# Patient Record
Sex: Female | Born: 1945 | Race: Black or African American | Hispanic: No | Smoking: Former smoker
Health system: Southern US, Community
[De-identification: ages and names within clinical notes are randomized; demographics above are authoritative.]

## PROBLEM LIST (undated history)

## (undated) DIAGNOSIS — E119 Type 2 diabetes mellitus without complications: Secondary | ICD-10-CM

## (undated) DIAGNOSIS — I639 Cerebral infarction, unspecified: Secondary | ICD-10-CM

## (undated) DIAGNOSIS — I10 Essential (primary) hypertension: Secondary | ICD-10-CM

## (undated) DIAGNOSIS — M1990 Unspecified osteoarthritis, unspecified site: Secondary | ICD-10-CM

## (undated) HISTORY — PX: HIP SURGERY: SHX245

## (undated) HISTORY — PX: CHOLECYSTECTOMY: SHX55

## (undated) HISTORY — PX: JOINT REPLACEMENT: SHX530

---

## 2004-07-04 ENCOUNTER — Other Ambulatory Visit: Payer: Self-pay

## 2004-07-05 ENCOUNTER — Ambulatory Visit: Payer: Self-pay

## 2005-02-07 ENCOUNTER — Ambulatory Visit: Payer: Self-pay

## 2006-11-13 ENCOUNTER — Ambulatory Visit: Payer: Self-pay | Admitting: Internal Medicine

## 2011-07-03 ENCOUNTER — Ambulatory Visit: Payer: Self-pay

## 2011-07-09 ENCOUNTER — Ambulatory Visit: Payer: Self-pay

## 2012-07-21 ENCOUNTER — Ambulatory Visit: Payer: Self-pay | Admitting: Internal Medicine

## 2012-11-02 ENCOUNTER — Ambulatory Visit: Payer: Self-pay | Admitting: Internal Medicine

## 2012-11-21 ENCOUNTER — Ambulatory Visit: Payer: Self-pay | Admitting: Internal Medicine

## 2013-03-01 ENCOUNTER — Ambulatory Visit: Payer: Self-pay | Admitting: Specialist

## 2013-03-01 LAB — BASIC METABOLIC PANEL
Anion Gap: 3 — ABNORMAL LOW (ref 7–16)
Calcium, Total: 9.3 mg/dL (ref 8.5–10.1)
Co2: 31 mmol/L (ref 21–32)
EGFR (African American): >60
Osmolality: 285 (ref 275–301)
Potassium: 3.9 mmol/L (ref 3.5–5.1)
Sodium: 141 mmol/L (ref 136–145)

## 2013-03-01 LAB — URINALYSIS, COMPLETE
Blood: NEGATIVE
Leukocyte Esterase: NEGATIVE
Protein: NEGATIVE
Squamous Epithelial: 6

## 2013-03-01 LAB — MRSA PCR SCREENING

## 2013-03-01 LAB — PROTIME-INR: Prothrombin Time: 13.5 secs (ref 11.5–14.7)

## 2013-03-01 LAB — CBC
MCH: 30 pg (ref 26.0–34.0)
RDW: 14.2 % (ref 11.5–14.5)

## 2013-03-10 ENCOUNTER — Inpatient Hospital Stay: Payer: Self-pay | Admitting: Specialist

## 2013-03-11 LAB — BASIC METABOLIC PANEL
Anion Gap: 6 — ABNORMAL LOW (ref 7–16)
BUN: 21 mg/dL — ABNORMAL HIGH (ref 7–18)
Chloride: 109 mmol/L — ABNORMAL HIGH (ref 98–107)
Co2: 23 mmol/L (ref 21–32)
Creatinine: 1.06 mg/dL (ref 0.60–1.30)
EGFR (African American): >60
EGFR (Non-African Amer.): 55 — ABNORMAL LOW
Glucose: 171 mg/dL — ABNORMAL HIGH (ref 65–99)
Osmolality: 283 (ref 275–301)
Potassium: 4 mmol/L (ref 3.5–5.1)
Sodium: 138 mmol/L (ref 136–145)

## 2013-03-12 LAB — HEMOGLOBIN: HGB: 7.3 g/dL — ABNORMAL LOW

## 2013-03-13 LAB — HEMOGLOBIN: HGB: 7.1 g/dL — ABNORMAL LOW (ref 12.0–16.0)

## 2013-03-14 ENCOUNTER — Encounter: Payer: Self-pay | Admitting: Internal Medicine

## 2013-03-23 ENCOUNTER — Encounter: Payer: Self-pay | Admitting: Internal Medicine

## 2013-09-13 ENCOUNTER — Ambulatory Visit: Payer: Self-pay | Admitting: Internal Medicine

## 2013-09-29 ENCOUNTER — Ambulatory Visit: Payer: Self-pay | Admitting: Podiatry

## 2013-12-13 ENCOUNTER — Inpatient Hospital Stay: Payer: Self-pay | Admitting: Internal Medicine

## 2013-12-13 LAB — CBC WITH DIFFERENTIAL/PLATELET
Basophil #: 0 10*3/uL (ref 0.0–0.1)
Basophil %: 0.4 %
Eosinophil #: 0 10*3/uL (ref 0.0–0.7)
Eosinophil %: 0.6 %
HCT: 37.6 % (ref 35.0–47.0)
HGB: 12.1 g/dL (ref 12.0–16.0)
LYMPHS PCT: 20.9 %
Lymphocyte #: 1.6 10*3/uL (ref 1.0–3.6)
MCH: 28.6 pg (ref 26.0–34.0)
MCHC: 32.3 g/dL (ref 32.0–36.0)
MCV: 89 fL (ref 80–100)
MONO ABS: 0.7 x10 3/mm (ref 0.2–0.9)
Monocyte %: 9.5 %
NEUTROS PCT: 68.6 %
Neutrophil #: 5.3 10*3/uL (ref 1.4–6.5)
PLATELETS: 270 10*3/uL (ref 150–440)
RBC: 4.24 10*6/uL (ref 3.80–5.20)
RDW: 15 % — AB (ref 11.5–14.5)
WBC: 7.7 10*3/uL (ref 3.6–11.0)

## 2013-12-13 LAB — BASIC METABOLIC PANEL
Anion Gap: 4 — ABNORMAL LOW (ref 7–16)
BUN: 14 mg/dL (ref 7–18)
CHLORIDE: 107 mmol/L (ref 98–107)
CO2: 29 mmol/L (ref 21–32)
Calcium, Total: 9.4 mg/dL (ref 8.5–10.1)
Creatinine: 1.08 mg/dL (ref 0.60–1.30)
GFR CALC NON AF AMER: 53 — AB
Glucose: 123 mg/dL — ABNORMAL HIGH (ref 65–99)
Osmolality: 281 (ref 275–301)
POTASSIUM: 4.7 mmol/L (ref 3.5–5.1)
SODIUM: 140 mmol/L (ref 136–145)

## 2013-12-13 LAB — TROPONIN I: Troponin-I: <0.02 ng/mL

## 2013-12-14 ENCOUNTER — Ambulatory Visit: Payer: Self-pay | Admitting: Neurology

## 2013-12-14 DIAGNOSIS — I519 Heart disease, unspecified: Secondary | ICD-10-CM

## 2013-12-14 LAB — LIPID PANEL
Cholesterol: 132 mg/dL (ref 0–200)
HDL Cholesterol: 66 mg/dL — ABNORMAL HIGH (ref 40–60)
Ldl Cholesterol, Calc: 42 mg/dL (ref 0–100)
TRIGLYCERIDES: 120 mg/dL (ref 0–200)
VLDL CHOLESTEROL, CALC: 24 mg/dL (ref 5–40)

## 2013-12-23 ENCOUNTER — Inpatient Hospital Stay: Payer: Self-pay | Admitting: Internal Medicine

## 2013-12-23 LAB — CBC
HCT: 40 % (ref 35.0–47.0)
HGB: 12.9 g/dL (ref 12.0–16.0)
MCH: 28.8 pg (ref 26.0–34.0)
MCHC: 32.2 g/dL (ref 32.0–36.0)
MCV: 89 fL (ref 80–100)
PLATELETS: 226 10*3/uL (ref 150–440)
RBC: 4.48 10*6/uL (ref 3.80–5.20)
RDW: 15.2 % — AB (ref 11.5–14.5)
WBC: 14.1 10*3/uL — AB (ref 3.6–11.0)

## 2013-12-23 LAB — COMPREHENSIVE METABOLIC PANEL
ALK PHOS: 109 U/L
ANION GAP: 8 (ref 7–16)
Albumin: 3.9 g/dL (ref 3.4–5.0)
BUN: 19 mg/dL — ABNORMAL HIGH (ref 7–18)
Bilirubin,Total: 1.4 mg/dL — ABNORMAL HIGH (ref 0.2–1.0)
Calcium, Total: 9.6 mg/dL (ref 8.5–10.1)
Chloride: 102 mmol/L (ref 98–107)
Co2: 24 mmol/L (ref 21–32)
Creatinine: 0.97 mg/dL (ref 0.60–1.30)
EGFR (African American): >60
Glucose: 189 mg/dL — ABNORMAL HIGH (ref 65–99)
OSMOLALITY: 276 (ref 275–301)
POTASSIUM: 4 mmol/L (ref 3.5–5.1)
SGOT(AST): 211 U/L — ABNORMAL HIGH (ref 15–37)
SGPT (ALT): 121 U/L — ABNORMAL HIGH (ref 12–78)
SODIUM: 134 mmol/L — AB (ref 136–145)
Total Protein: 8.9 g/dL — ABNORMAL HIGH (ref 6.4–8.2)

## 2013-12-23 LAB — PROTIME-INR
INR: 1
PROTHROMBIN TIME: 12.9 s (ref 11.5–14.7)

## 2013-12-23 LAB — T4, FREE: Free Thyroxine: 1.16 ng/dL (ref 0.76–1.46)

## 2013-12-23 LAB — TROPONIN I

## 2013-12-23 LAB — CK TOTAL AND CKMB (NOT AT ARMC)
CK, Total: 98 U/L
CK-MB: 1 ng/mL (ref 0.5–3.6)

## 2013-12-23 LAB — APTT: ACTIVATED PTT: 37.6 s — AB (ref 23.6–35.9)

## 2013-12-23 LAB — LIPASE, BLOOD: Lipase: 241 U/L (ref 73–393)

## 2013-12-24 LAB — CBC WITH DIFFERENTIAL/PLATELET
Basophil #: 0 10*3/uL (ref 0.0–0.1)
Basophil %: 0.2 %
EOS ABS: 0 10*3/uL (ref 0.0–0.7)
EOS PCT: 0 %
HCT: 33.4 % — AB (ref 35.0–47.0)
HGB: 11 g/dL — AB (ref 12.0–16.0)
Lymphocyte #: 0.6 10*3/uL — ABNORMAL LOW (ref 1.0–3.6)
Lymphocyte %: 4.4 %
MCH: 29 pg (ref 26.0–34.0)
MCHC: 32.8 g/dL (ref 32.0–36.0)
MCV: 88 fL (ref 80–100)
MONO ABS: 0.8 x10 3/mm (ref 0.2–0.9)
MONOS PCT: 6.1 %
NEUTROS ABS: 11.8 10*3/uL — AB (ref 1.4–6.5)
NEUTROS PCT: 89.3 %
Platelet: 193 10*3/uL (ref 150–440)
RBC: 3.79 10*6/uL — ABNORMAL LOW (ref 3.80–5.20)
RDW: 15.2 % — AB (ref 11.5–14.5)
WBC: 13.2 10*3/uL — ABNORMAL HIGH (ref 3.6–11.0)

## 2013-12-24 LAB — COMPREHENSIVE METABOLIC PANEL
Albumin: 3 g/dL — ABNORMAL LOW (ref 3.4–5.0)
Alkaline Phosphatase: 126 U/L — ABNORMAL HIGH
Anion Gap: 6 — ABNORMAL LOW (ref 7–16)
BILIRUBIN TOTAL: 2.6 mg/dL — AB (ref 0.2–1.0)
BUN: 22 mg/dL — ABNORMAL HIGH (ref 7–18)
CHLORIDE: 104 mmol/L (ref 98–107)
Calcium, Total: 8.6 mg/dL (ref 8.5–10.1)
Co2: 26 mmol/L (ref 21–32)
Creatinine: 1.51 mg/dL — ABNORMAL HIGH (ref 0.60–1.30)
EGFR (Non-African Amer.): 36 — ABNORMAL LOW
GFR CALC AF AMER: 41 — AB
Glucose: 160 mg/dL — ABNORMAL HIGH (ref 65–99)
Osmolality: 279 (ref 275–301)
Potassium: 3.6 mmol/L (ref 3.5–5.1)
SGOT(AST): 1222 U/L — ABNORMAL HIGH (ref 15–37)
SGPT (ALT): 943 U/L — ABNORMAL HIGH (ref 12–78)
SODIUM: 136 mmol/L (ref 136–145)
TOTAL PROTEIN: 6.9 g/dL (ref 6.4–8.2)

## 2013-12-25 ENCOUNTER — Ambulatory Visit: Payer: Self-pay | Admitting: Neurology

## 2013-12-25 LAB — COMPREHENSIVE METABOLIC PANEL
ALBUMIN: 2.6 g/dL — AB (ref 3.4–5.0)
ALT: 608 U/L — AB (ref 12–78)
AST: 378 U/L — AB (ref 15–37)
Alkaline Phosphatase: 153 U/L — ABNORMAL HIGH
Anion Gap: 8 (ref 7–16)
BILIRUBIN TOTAL: 2.8 mg/dL — AB (ref 0.2–1.0)
BUN: 13 mg/dL (ref 7–18)
CALCIUM: 7.8 mg/dL — AB (ref 8.5–10.1)
Chloride: 109 mmol/L — ABNORMAL HIGH (ref 98–107)
Co2: 23 mmol/L (ref 21–32)
Creatinine: 1.12 mg/dL (ref 0.60–1.30)
EGFR (Non-African Amer.): 51 — ABNORMAL LOW
GFR CALC AF AMER: 59 — AB
GLUCOSE: 41 mg/dL — AB (ref 65–99)
Osmolality: 276 (ref 275–301)
Potassium: 3.6 mmol/L (ref 3.5–5.1)
SODIUM: 140 mmol/L (ref 136–145)
Total Protein: 6.8 g/dL (ref 6.4–8.2)

## 2013-12-25 LAB — CBC WITH DIFFERENTIAL/PLATELET
Basophil #: 0 10*3/uL (ref 0.0–0.1)
Basophil %: 0.2 %
EOS ABS: 0 10*3/uL (ref 0.0–0.7)
Eosinophil %: 0.1 %
HCT: 30.5 % — AB (ref 35.0–47.0)
HGB: 10.3 g/dL — AB (ref 12.0–16.0)
LYMPHS ABS: 0.8 10*3/uL — AB (ref 1.0–3.6)
LYMPHS PCT: 8.6 %
MCH: 29.7 pg (ref 26.0–34.0)
MCHC: 33.7 g/dL (ref 32.0–36.0)
MCV: 88 fL (ref 80–100)
MONOS PCT: 7.5 %
Monocyte #: 0.7 x10 3/mm (ref 0.2–0.9)
NEUTROS ABS: 7.6 10*3/uL — AB (ref 1.4–6.5)
Neutrophil %: 83.6 %
Platelet: 177 10*3/uL (ref 150–440)
RBC: 3.46 10*6/uL — AB (ref 3.80–5.20)
RDW: 15.2 % — ABNORMAL HIGH (ref 11.5–14.5)
WBC: 9.1 10*3/uL (ref 3.6–11.0)

## 2013-12-26 LAB — COMPREHENSIVE METABOLIC PANEL
ALT: 415 U/L — AB (ref 12–78)
AST: 206 U/L — AB (ref 15–37)
Albumin: 2.5 g/dL — ABNORMAL LOW (ref 3.4–5.0)
Alkaline Phosphatase: 210 U/L — ABNORMAL HIGH
Anion Gap: 7 (ref 7–16)
BUN: 6 mg/dL — AB (ref 7–18)
Bilirubin,Total: 1.9 mg/dL — ABNORMAL HIGH (ref 0.2–1.0)
CALCIUM: 7.9 mg/dL — AB (ref 8.5–10.1)
CHLORIDE: 110 mmol/L — AB (ref 98–107)
CO2: 23 mmol/L (ref 21–32)
CREATININE: 1.01 mg/dL (ref 0.60–1.30)
EGFR (African American): >60
GFR CALC NON AF AMER: 58 — AB
Glucose: 128 mg/dL — ABNORMAL HIGH (ref 65–99)
Osmolality: 279 (ref 275–301)
POTASSIUM: 3.3 mmol/L — AB (ref 3.5–5.1)
SODIUM: 140 mmol/L (ref 136–145)
Total Protein: 6.8 g/dL (ref 6.4–8.2)

## 2013-12-26 LAB — CBC WITH DIFFERENTIAL/PLATELET
BASOS PCT: 0.3 %
Basophil #: 0 10*3/uL (ref 0.0–0.1)
Eosinophil #: 0 10*3/uL (ref 0.0–0.7)
Eosinophil %: 0.6 %
HCT: 31.3 % — AB (ref 35.0–47.0)
HGB: 10.5 g/dL — AB (ref 12.0–16.0)
Lymphocyte #: 0.9 10*3/uL — ABNORMAL LOW (ref 1.0–3.6)
Lymphocyte %: 18.4 %
MCH: 29.4 pg (ref 26.0–34.0)
MCHC: 33.5 g/dL (ref 32.0–36.0)
MCV: 88 fL (ref 80–100)
MONOS PCT: 11.6 %
Monocyte #: 0.6 x10 3/mm (ref 0.2–0.9)
NEUTROS ABS: 3.5 10*3/uL (ref 1.4–6.5)
Neutrophil %: 69.1 %
PLATELETS: 177 10*3/uL (ref 150–440)
RBC: 3.57 10*6/uL — AB (ref 3.80–5.20)
RDW: 15.3 % — ABNORMAL HIGH (ref 11.5–14.5)
WBC: 5 10*3/uL (ref 3.6–11.0)

## 2013-12-27 LAB — COMPREHENSIVE METABOLIC PANEL
ALBUMIN: 2.3 g/dL — AB (ref 3.4–5.0)
ALK PHOS: 245 U/L — AB
AST: 114 U/L — AB (ref 15–37)
Anion Gap: 5 — ABNORMAL LOW (ref 7–16)
BUN: 6 mg/dL — AB (ref 7–18)
Bilirubin,Total: 1.3 mg/dL — ABNORMAL HIGH (ref 0.2–1.0)
Calcium, Total: 7.9 mg/dL — ABNORMAL LOW (ref 8.5–10.1)
Chloride: 108 mmol/L — ABNORMAL HIGH (ref 98–107)
Co2: 25 mmol/L (ref 21–32)
Creatinine: 0.99 mg/dL (ref 0.60–1.30)
GFR CALC NON AF AMER: 59 — AB
Glucose: 142 mg/dL — ABNORMAL HIGH (ref 65–99)
OSMOLALITY: 276 (ref 275–301)
Potassium: 3.6 mmol/L (ref 3.5–5.1)
SGPT (ALT): 296 U/L — ABNORMAL HIGH (ref 12–78)
Sodium: 138 mmol/L (ref 136–145)
Total Protein: 6.6 g/dL (ref 6.4–8.2)

## 2013-12-27 LAB — CBC WITH DIFFERENTIAL/PLATELET
Basophil #: 0 10*3/uL (ref 0.0–0.1)
Basophil %: 0.3 %
EOS ABS: 0 10*3/uL (ref 0.0–0.7)
EOS PCT: 0.2 %
HCT: 30.8 % — AB (ref 35.0–47.0)
HGB: 10.3 g/dL — AB (ref 12.0–16.0)
Lymphocyte #: 0.9 10*3/uL — ABNORMAL LOW (ref 1.0–3.6)
Lymphocyte %: 12 %
MCH: 29.3 pg (ref 26.0–34.0)
MCHC: 33.3 g/dL (ref 32.0–36.0)
MCV: 88 fL (ref 80–100)
Monocyte #: 0.8 x10 3/mm (ref 0.2–0.9)
Monocyte %: 10.1 %
NEUTROS PCT: 77.4 %
Neutrophil #: 6.1 10*3/uL (ref 1.4–6.5)
Platelet: 188 10*3/uL (ref 150–440)
RBC: 3.51 10*6/uL — ABNORMAL LOW (ref 3.80–5.20)
RDW: 15.2 % — ABNORMAL HIGH (ref 11.5–14.5)
WBC: 7.8 10*3/uL (ref 3.6–11.0)

## 2013-12-28 LAB — PATHOLOGY REPORT

## 2013-12-28 LAB — COMPREHENSIVE METABOLIC PANEL
Albumin: 2.4 g/dL — ABNORMAL LOW (ref 3.4–5.0)
Alkaline Phosphatase: 255 U/L — ABNORMAL HIGH
Anion Gap: 4 — ABNORMAL LOW (ref 7–16)
BILIRUBIN TOTAL: 1.4 mg/dL — AB (ref 0.2–1.0)
BUN: 5 mg/dL — ABNORMAL LOW (ref 7–18)
CO2: 26 mmol/L (ref 21–32)
CREATININE: 0.98 mg/dL (ref 0.60–1.30)
Calcium, Total: 8 mg/dL — ABNORMAL LOW (ref 8.5–10.1)
Chloride: 105 mmol/L (ref 98–107)
EGFR (Non-African Amer.): >60
GLUCOSE: 157 mg/dL — AB (ref 65–99)
Osmolality: 271 (ref 275–301)
Potassium: 3.3 mmol/L — ABNORMAL LOW (ref 3.5–5.1)
SGOT(AST): 69 U/L — ABNORMAL HIGH (ref 15–37)
SGPT (ALT): 224 U/L — ABNORMAL HIGH (ref 12–78)
Sodium: 135 mmol/L — ABNORMAL LOW (ref 136–145)
Total Protein: 6.8 g/dL (ref 6.4–8.2)

## 2013-12-28 LAB — HEPATIC FUNCTION PANEL A (ARMC): Bilirubin, Direct: 0.8 mg/dL — ABNORMAL HIGH (ref 0.00–0.20)

## 2013-12-29 LAB — COMPREHENSIVE METABOLIC PANEL
AST: 47 U/L — AB (ref 15–37)
Albumin: 2.4 g/dL — ABNORMAL LOW (ref 3.4–5.0)
Alkaline Phosphatase: 264 U/L — ABNORMAL HIGH
Anion Gap: 3 — ABNORMAL LOW (ref 7–16)
BILIRUBIN TOTAL: 1.3 mg/dL — AB (ref 0.2–1.0)
BUN: 5 mg/dL — AB (ref 7–18)
CO2: 28 mmol/L (ref 21–32)
Calcium, Total: 8.4 mg/dL — ABNORMAL LOW (ref 8.5–10.1)
Chloride: 105 mmol/L (ref 98–107)
Creatinine: 0.86 mg/dL (ref 0.60–1.30)
EGFR (African American): >60
EGFR (Non-African Amer.): >60
Glucose: 141 mg/dL — ABNORMAL HIGH (ref 65–99)
OSMOLALITY: 272 (ref 275–301)
Potassium: 3.3 mmol/L — ABNORMAL LOW (ref 3.5–5.1)
SGPT (ALT): 171 U/L — ABNORMAL HIGH (ref 12–78)
Sodium: 136 mmol/L (ref 136–145)
Total Protein: 6.9 g/dL (ref 6.4–8.2)

## 2013-12-29 LAB — HEPATIC FUNCTION PANEL A (ARMC): Bilirubin, Direct: 0.6 mg/dL — ABNORMAL HIGH (ref 0.00–0.20)

## 2014-10-06 ENCOUNTER — Ambulatory Visit: Payer: Self-pay | Admitting: Internal Medicine

## 2015-01-13 NOTE — Discharge Summary (Signed)
PATIENT NAME:  [name] [name], DEEMER MR#:  [id number] DATE OF BIRTH:  21-Dec-1945  DATE OF ADMISSION:  03/10/2013 DATE OF DISCHARGE:    DISCHARGE DIAGNOSES:  1.  Severe degenerative arthritis, right hip.  2.  Hypercholesterolemia.  3.  Diabetes mellitus  4.  Hypertension.   OPERATIONS/PROCEDURES PERFORMED: Right total hip arthroplasty.   HISTORY AND PHYSICAL EXAMINATION: As written on admission.   LABORATORY DATA: As noted in the chart. Preoperative medical clearance was obtained.   HOSPITAL COURSE: The patient was taken to the operating room on 03/10/2013 where AML right total hip arthroplasty was performed. The patient tolerated the procedure quite well without any particular problems. On postoperative day 1, her hemoglobin was low at 8.0 and then followup on 06/20 was 7.3. The patient was advanced in physical therapy to ambulation and transfers, full weight-bearing using the walker. She demonstrated no evidence of cardiovascular insufficiency or need for transfusion. She is to be discharged to [facility] rehab facility on 03/14/2013. Her medications are as on the discharge medication sheet. She is basically to resume her home medications as before and in addition aspirin 325 mg twice a day as prophylaxis for venous thrombosis and oxycodone for analgesia. She is to have physical therapy for ambulation with a walker and transfers, full weight-bearing. She is to have her hemoglobin rechecked on the morning of 03/13/2013. She is to return to the office to see Dr. Tamala Julian in 2 weeks. Her anemia will need to be followed along at [facility] by the attending physician. ____________________________ Lucas Mallow, MD ces:aw D: 03/12/2013 13:43:00 ET T: 03/12/2013 13:58:02 ET JOB#: [id number]  cc: Lucas Mallow, MD, <Dictator> Lucas Mallow MD ELECTRONICALLY SIGNED 03/15/2013 8:05

## 2015-01-13 NOTE — Op Note (Signed)
PATIENT NAME:  [name] [name], FRITSCH MR#:  [id number] DATE OF BIRTH:  29-Sep-1945  DATE OF PROCEDURE:  03/10/2013  PREOPERATIVE DIAGNOSIS: Severe degenerative arthritis, left hip.   POSTOPERATIVE DIAGNOSIS: Severe degenerative arthritis, left hip.   PROCEDURE: Left total hip replacement.   SURGEON: Christophe Louis, M.D.   ASSISTANT: Earnestine Leys, M.D.   ANESTHESIA: General.   COMPLICATIONS: None.   ESTIMATED BLOOD LOSS: 400 mL.   DRAINS: 2 Autovacs.   PROCEDURE: 2 grams of Ancef were given intravenously prior to the procedure. Spinal anesthesia was attempted and was unsuccessful, and therefore general anesthesia was induced. The patient was turned into the right lateral decubitus position for left hip surgery and secured with the hip-grips. A standard posterolateral incision was made. The dissection was carried down to the fascia lata which was in cut in the line of the skin incision. The sciatic nerve was palpated deep through within the wound and protected throughout the case. The hip was dislocated after cutting and tagging the piriformis tendon and cutting and tagging the joint capsule in a T fashion. Femoral head and neck were cut at the appropriate angle and length. Retractors were placed about the acetabulum and thorough soft tissue debridement was performed. The femoral head was measured at 46 mm. The acetabulum was serially reamed up to a 49 mm, and the 50 mm trial acetabulum was impacted into place and was seen to be an excellent fit. Therefore, the porous-coated tri-spike DePuy acetabulum was impacted into place and was seen to be in good position and alignment. A hole eliminator was placed. Polyethylene liner was impacted into position.   Attention was then turned to the proximal femur, where this was reamed up to 13 mm and broached up to a narrow 13.5 AML broach. The hip was reduced with a +1 hip ball and a 32 mm head and this was seen to be stable, with equal leg lengths. The broach  was then removed and the porous-coated AML prosthesis was impacted into place and seen to be an excellent, secure fit admit. The 1 mm, 32 mm ball was impacted into position. The wound was thoroughly irrigated multiple times with the pulsatile lavage throughout the case. The hip was reduced and was seen to be stable, with equal leg lengths. The wound was thoroughly irrigated multiple times again. The fascia lata was closed with multiple 0 Ethibond sutures. Two Autovac drains were brought out through separate stab wound incisions. The subcutaneous tissue was closed with 2-0 Vicryl and the skin was closed with the skin stapler. A soft bulky dressing was applied. The patient was turned onto the hospital bed and the abduction pillow was put into position. The patient was returned to the recovery room in satisfactory condition, having tolerated the procedure quite well.     ____________________________ Lucas Mallow, MD ces:dm D: 03/10/2013 12:32:05 ET T: 03/10/2013 15:32:53 ET JOB#: [id number]  cc: Lucas Mallow, MD, <Dictator> Lucas Mallow MD ELECTRONICALLY SIGNED 03/11/2013 6:24

## 2015-01-14 NOTE — Consult Note (Signed)
PATIENT NAME:  [name] [name], [name] [name] MR#:  [id number] DATE OF BIRTH:  1946/01/27  DATE OF CONSULTATION:  12/23/2013  REFERRING PHYSICIAN:   CONSULTING PHYSICIAN:  Jerrol Banana. Burt Knack, MD  CHIEF COMPLAINT: Abdominal pain.   HISTORY OF PRESENT ILLNESS: This is a patient with the acute onset of abdominal pain earlier today, early afternoon approximately 1:00 or 2:00. She does not relate it to any kind of meal. She has never had an episode like this before and points to the area of the periumbilical and epigastrium. Denies any back pain and, in fact, states at this point, her pain is completely gone. She has no residual whatsoever.   The patient denies any changes to bowel habits or dark urine and again has had no previous episodes such as this. Denies fevers or chills.   Also of note, she has had a recent right hemispheric stroke.   PAST MEDICAL HISTORY: Recent stroke, atrial fibrillation, diabetes and hypercholesterolemia.   PAST SURGICAL HISTORY: No abdominal surgeries. She has had a hip surgery.   ALLERGIES: None.   MEDICATIONS: Multiple, see reconciliation.   FAMILY HISTORY: Noncontributory.   SOCIAL HISTORY: The patient smoked approximately 1 pack of cigarettes per week but stopped about a year ago. Does not drink alcohol. She is accompanied by multiple family numbers.   REVIEW OF SYSTEMS: A 10-system review is performed and negative with the exception of that mentioned in the HPI.    PHYSICAL EXAMINATION:  GENERAL: Obvious right facial droop.  VITAL SIGNS: Stable. She is afebrile.  HEENT: Shows no scleral icterus. Right facial droop.  NECK: No palpable neck nodes.   CHEST: Clear to auscultation.  CARDIAC: Irregularly irregular with heart rate approximately 90.  ABDOMEN: Soft, nondistended. No scars are noted. Nontympanitic and essentially nontender.  EXTREMITIES: Show minimal edema.  NEUROLOGIC: Demonstrates a facial droop. Her grip strength is bilaterally symmetric, possibly slightly  weaker on the left.  INTEGUMENT: Shows no jaundice.   RADIOLOGY AND LABORATORY DATA: CT scan is personally reviewed. Dilated bile duct and pancreatic duct with no stones. Dilated gallbladder with slightly thickened wall.   White blood cell count is elevated at 14, H and H of 12.9 and 40 and a platelet count of 226. Liver function tests are elevated with a bilirubin of 1.4, AST and ALT of 211 and 121 and an albumin of 3.9. Sodium of 134.   ASSESSMENT AND PLAN: This is a patient with a fairly classic history of a first episode of biliary colic and workup shows no stones but dilated bile duct and dilated gallbladder with thickened gallbladder wall. I suspect this patient has gallstones, and an ultrasound has been ordered after discussing the case with Dr. Lenise Arena in the ED. She is to be admitted by [facility], and I have spoken to the [facility] physician today to discuss disposition. She will be admitted to the hospital because of her atrial fibrillation. He stated that she would not be a candidate for anticoagulation because of her recent stroke at least for another week. We will check the results of the ultrasound, and if this is classic gallbladder disease, cholecystectomy may be performed sooner rather than later because of impending anticoagulation due to atrial fibrillation. If her cardiac disease can be stabilized and gallbladder disease is identified as the source of her pain, then surgical indications are present. This was discussed with the family and both [facility] and ER physician, and I will discuss this case with Dr. Marina Gravel who will  be taking over care in the morning.    ____________________________ Jerrol Banana. Burt Knack, MD rec:gb D: 12/23/2013 20:57:51 ET T: 12/23/2013 21:24:21 ET JOB#: [id number]  cc: Jerrol Banana. Burt Knack, MD, <Dictator> Florene Glen MD ELECTRONICALLY SIGNED 12/24/2013 6:55

## 2015-01-14 NOTE — H&P (Signed)
PATIENT NAME:  [name], [name] MR#:  [id number] DATE OF BIRTH:  03-23-1946  DATE OF ADMISSION:  12/23/2013  PRIMARY CARE PHYSICIAN:  Dr. Lamonte Sakai.   REFERRING PHYSICIAN:  Dr. Delman Kitten.   CHIEF COMPLAINT:  Abdominal pain and palpitations.   HISTORY OF PRESENT ILLNESS:  [name] [name] is a 69 year old female who had a recent for admission for stroke, atrial fibrillation, was doing well in her usual state of health until this afternoon when the patient started to experience pain in the abdomen and epigastric and right upper quadrant.  The patient states 10 by 10 intensity, pain radiating to the back.  This was associated with multiple episodes of vomiting.  The patient also noticed having palpitations.  Concerning this, the patient came to the Emergency Department.  The patient's initial heart rate was in the 130s; however, the patient's heart rate improved without any intervention.  Concerning about the patient's abdominal pain, CT abdomen and pelvis was done which showed possible thickening of the gallbladder.  Concerning this, general surgery was consulted.  Ultrasound of the abdomen was done which showed consistent with cholecystitis.  The patient had a mildly elevated WBC count with mild elevation of the LFTs.  However, on exam the patient does not have any tenderness in the right upper quadrant.  Concerning this, general surgery recommended the patient to follow up as an outpatient.  The patient continues to have elevated heart rate over 100s.  Concerning this, the decision is made to admit the patient under medicine's care.  In the Emergency Department, the patient was given morphine and Zofran with significant improvement with the symptoms.   PAST MEDICAL HISTORY: 1.  Hypertension.  2.  Hyperlipidemia.  3.  Diabetes mellitus.  4.  Previous history of smoking.  5.  Atrial fibrillation.  PAST SURGICAL HISTORY: 1.  Right hip replacement.  2.  Recent history of stroke.   HOME  MEDICATIONS: 1.  Pioglitazone 45 mg once a day.  2.  Januvia 100 mg once a day.  3.  Lisinopril/hydrochlorothiazide 20/12.5 mg once a day.  4.  Glipizide 10 mg 2 times a day.  5.  Atorvastatin 20 mg once a day.  6.  Aspirin 81 mg once a day.  7.  Acarbose 100 mg 2 times a day.   SOCIAL HISTORY:  Previous history of smoking, quit smoking in [place], smoked 1 pack in three days.  No history of alcohol or drug use.  Lives with her husband.  FAMILY HISTORY:  Positive for coronary artery disease and hypothyroidism.   REVIEW OF SYSTEMS:  CONSTITUTIONAL:  Has generalized weakness.  EYES:  No change in vision.  EARS, NOSE, THROAT:  No change in hearing.  RESPIRATORY:  No cough, shortness of breath.  CARDIOVASCULAR:  Has palpitations.  GASTROINTESTINAL:  Has nausea, vomiting and abdominal pain.  GENITOURINARY:  No dysuria or hematuria.  HEMATOLOGIC:  No easy bruising or bleeding.  SKIN:  No rash or lesions.  MUSCULOSKELETAL:  No joint pains and aches.  ENDOCRINE:  No polyuria or polydipsia.  NEUROLOGIC:  No weakness or numbness in any part of the body.   PHYSICAL EXAMINATION: GENERAL:  This is a well-built, well-nourished, age-appropriate female lying down in the bed, not in distress.  VITAL SIGNS:  Temperature 103.1, pulse 105, blood pressure 124/62, respiratory rate of 16, oxygen saturation is 98% on room air.  HEENT:  Head normocephalic, atraumatic.  Eyes, no scleral icterus.  Conjunctivae normal.  Pupils equal and react to  light.  Extraocular movements are intact.  Mucous membranes moist.  No pharyngeal erythema.  NECK:  Supple.  No lymphadenopathy.  No JVD.  No carotid bruit.  No thyromegaly.  CHEST:  Has no focal tenderness.  LUNGS:  Bilateral clear to auscultation.  HEART:  S1, S2, irregularly irregular, tachycardia.  ABDOMEN:  Obese abdomen.  Bowel sounds plus.  Soft.  Could not appreciate tenderness in the right upper quadrant; however, the patient is also obese.  EXTREMITIES:  No  pedal edema.  Pulses 2+.  SKIN:  No rash or lesions.  MUSCULOSKELETAL:  Good range of motion in all the extremities.   LABORATORY DATA:  Ultrasound of the abdomen, marked dilatation of the common bile duct as seen on previous CT scan.  Differential considerations are unchanged including choledocholithiasis, distal common bile duct stricture and the distal obstructing mass.  CT abdomen and pelvis, mild gallbladder distention and diffuse gallbladder wall thickening.  Differential consideration, acute on chronic cholecystitis, double duct sign with dilatation of the main pancreatic and common bile duct with  tapering in the pancreatic head.  Differential diagnosis, choledocholithiasis.  Chest x-ray, one view, portable:  Minimal bibasilar atelectasis.  Lipase 241.  CMP is completely within normal limits except for ALT 121, AST 211.  CBC:  WBC of 14,000, hemoglobin 13, platelet count of 226.   ASSESSMENT AND PLAN:  [name] [name] is a 69 year old female who comes to the Emergency Department with abdominal pain, atrial fibrillation with rapid ventricular rate.  1.  Atrial fibrillation with rapid ventricular rate, most likely secondary to the patient was unable to take her medications from nausea and vomiting.  Admit the patient to the monitored bed.  Start back on home medications and follow up.  This also could be precipitated by sepsis as the patient has fever, elevated white blood cell count, possible cholangiectasis or cholecystitis.  Treating underlying infection also could improve the heart rate.  2.  Abdominal pain highly concerning about cholecystitis or choledocholithiasis.  Ultrasound shows double duct sign suggestive of choledocholithiasis.  Keep the patient on Zosyn.  Consulted gastroenterology, planning to do the cholecystectomy during this admission prior to starting on anticoagulation for the patient's CHADS score of 3.   3.  Recent history of cerebrovascular accident with very minimal neurologic  deficits.  4.  Hypertension:  Currently well controlled.  5.  Keep the patient on deep vein thrombosis prophylaxis with Lovenox.   TIME SPENT:  50 minutes.    ____________________________ Monica Becton, MD pv:ea D: 12/24/2013 01:51:00 ET T: 12/24/2013 02:52:20 ET JOB#: [id number]  cc: Monica Becton, MD, <Dictator> Perrin Maltese, MD Grier Mitts Tron Flythe MD ELECTRONICALLY SIGNED 01/06/2014 0:33

## 2015-01-14 NOTE — Consult Note (Signed)
PATIENT NAME:  [name] [name], [name] [name] MR#:  [id number] DATE OF BIRTH:  09-05-1946  DATE OF CONSULTATION:  12/24/2013  REFERRING PHYSICIAN:   CONSULTING PHYSICIAN:  Manya Silvas, MD  HISTORY OF PRESENT ILLNESS:  The patient is a 69 year old black female who was admitted to the hospital on April 2nd. She had the onset of abdominal pain followed by radiation of the pain into the back associated with multiple episodes of vomiting. Because of this, she came to the ER. She was noted to have a tachycardia which improved. CAT scan of the abdomen and pelvis showed thickening of the gallbladder. Ultrasound was done showing changes consistent with cholecystitis. She was found to have a mildly elevated white count and she also had a temp spike to 103. She was admitted to the hospital. Blood work showed her to have elevated liver functions of significance. I was asked to see her in consultation.   PAST MEDICAL HISTORY: 1.  Atrial fibrillation.  2.  Diabetes.  3.  Hyperlipidemia.  4.  Hypertension.  5.  Recent possible stroke versus prolonged TIA.    PAST SURGICAL HISTORY: Right hip replacement.   HOME MEDICATIONS: Pioglitazone 45 mg a day, Januvia 100 mg a day, lisinopril/HCTZ 20/12.5 once a day, glipizide 10 mg 2 times a day, atorvastatin 20 mg a day, aspirin 81 mg a day, acarbose 100 mg 2 times a day.   HABITS: Smoked a pack every 3 days, quit smoking recently. No history of alcohol use.   FAMILY HISTORY: Positive for heart disease and thyroid disease.   REVIEW OF SYSTEMS: The patient was recently in the hospital for a possible small stroke, some residual difficulty with closing her eye.  She does complain of weakness, nausea, vomiting, abdominal pain. No shortness of breath. No exertional chest pain. She does have palpitations. No dysuria or hematuria.   PHYSICAL EXAMINATION: GENERAL: A pleasant black female in no acute distress.  HEENT: Sclerae slightly icteric. The head is atraumatic. Trachea is in  the midline.  CHEST: Clear.  HEART: Shows a few irregular beats.  ABDOMEN: Somewhat obese. Minimal tenderness right upper quadrant.  EXTREMITIES:  Has 1 to 2+ edema.   LABORATORY, DIAGNOSTIC AND RADIOLOGICAL DATA:  Ultrasound showed dilatation of the common duct. Possible causes are common bile duct stones, strictures or obstructing mass. CT of the abdomen showed diffuse gallbladder wall thickening, mild distention, probable cholecystitis. A double duct sign was seen with dilatation of the pancreatic and common bile duct. White count 14,000, hemoglobin 13. ALT 121, AST 211.    ASSESSMENT: Acute cholecystitis, probable ascending cholangitis.   RECOMMENDATIONS: IV antibiotics as you started, surgical consultation as has been done. The patient needs MRCP to distinguish if there are any residual stones in the common bile duct.  If so, she will need transfer to another facility. If not, then we should be able to take care of her problems here.  I will follow with you.   ____________________________ Manya Silvas, MD rte:cs D: 12/26/2013 13:43:57 ET T: 12/26/2013 14:07:24 ET JOB#: [id number]  cc: Manya Silvas, MD, <Dictator> Manya Silvas MD ELECTRONICALLY SIGNED 01/03/2014 14:18

## 2015-01-14 NOTE — Consult Note (Signed)
Discussed briefly with surgeon and patient and family, MRCP showed gall bladder inflammation, dilated CBD but no stone or likely tumor.Lovenox held yesterday in anticipation of surgery, further dosing per Dr. Marina Gravel.  Appears to have edema of ampulla from CBD stone which has hopefully passed.  LFT's improving.  Discussed with family that a small increased risk of CVA with her recent one but greater risk of problem from cholecystitis and further attacks of ascending cholangitis.   Electronic Signatures: Manya Silvas (MD)  (Signed on 04-Apr-15 12:22)  Authored  Last Updated: 04-Apr-15 12:22 by Manya Silvas (MD)

## 2015-01-14 NOTE — Discharge Summary (Signed)
PATIENT NAME:  [name] [name], [name] [name] MR#:  [id number] DATE OF BIRTH:  08-Dec-1945  DATE OF ADMISSION:  12/23/2013  DATE OF DISCHARGE:  12/29/2013  ADMITTING PHYSICIAN:  Dr. Lunette Stands  DISCHARGE PHYSICIAN: Dr. Elijio Miles   DISCHARGE DIAGNOSES:  1.  Cholecystitis.  2.  Choledocholithiasis.   SECONDARY DIAGNOSES:  1.  Diabetes mellitus.  2.  Hypertension.  3.  Recent stroke.   PROCEDURE: Laparoscopic cholecystectomy and intraoperative cholangiogram performed by Dr. Sherri Rad.   IMAGING: MRI and MRCP of the abdomen, which confirmed cholecystitis, negative for choledocholithiasis.   DISCHARGE MEDICATIONS: Please refer to medical reconciliation. The patient was  discharged on Percocet 5/325 p.r.n. q. 4 hours for pain, and on Klor-Con 20 mEq b.i.d.   HOSPITAL COURSE: The patient was admitted with right upper quadrant pain with nausea. She had markedly elevated LFTs, initially was suggestive of cholecystitis. However, due to a rise in LFTs the following day, General Surgery expressed concern of choledocholithiasis. On the second day of admission, her LFTs declined. MRCP did not show choledocholithiasis, but confirmed cholecystitis. Dr. Marina Gravel then proceeded to perform a cholecystectomy with intraoperative cholangiogram on 11/25/2013. This was performed successfully, without any complications. The patient was cleared for surgery prior to that by Neurology, given her recent stroke. The patient'Luismario Coston postoperative care was otherwise uncomplicated. Her drain was removed a few days later by Dr. Pat Patrick. Her diabetes remained well controlled on sliding scale insulin coverage. The patient is discharged to home in satisfactory condition.   DISCHARGE INSTRUCTIONS:  Diet is ADA low-sodium diet. Activity as tolerated. Follow up with General Surgery in one week, with Dr. Elijio Miles in 1 to 2 weeks.   Time Spent: 35 minutes.    ____________________________ Venetia Maxon Elijio Miles, MD sat:mr D: 01/15/2014 11:26:57  ET T: 01/15/2014 22:04:33 ET JOB#: [id number]  cc: Sheikh A. Elijio Miles, MD, <Dictator> Veverly Fells MD ELECTRONICALLY SIGNED 01/16/2014 16:02

## 2015-01-14 NOTE — Consult Note (Signed)
Brief Consult Note: Diagnosis: abd pain, resolved; see dictation.   Patient was seen by consultant.   Consult note dictated.   Recommend further assessment or treatment.   Discussed with Attending MD.   Comments: Pt has fairly classic presentation for first episode of biliary tract disease with elevated LFT, dilated ducts but no stones on U/S. Recent stroke and new onset afib with RVR. PD will admit. U/S pending. [place]se with PD and ER MD.  Electronic Signatures: Florene Glen (MD)  (Signed 02-Apr-15 20:47)  Authored: Brief Consult Note   Last Updated: 02-Apr-15 20:47 by Florene Glen (MD)

## 2015-01-14 NOTE — Consult Note (Signed)
PATIENT NAME:  [name] [name], [name] [name] MR#:  [id number] DATE OF BIRTH:  1946/01/21  DATE OF CONSULTATION:  12/14/2013  REFERRING PHYSICIAN:   CONSULTING PHYSICIAN:  Leotis Pain, MD  REASON FOR CONSULTATION:  Right facial droop, right upper extremity numbness.  HISTORY OF PRESENT ILLNESS:  This is a pleasant 69 year old female with past medical history of hypertension, diabetes, hyperlipidemia, former smoker, presents with a right facial droop and numbness in the right-side extremity. Symptoms started since Sunday as complaints were mild. No speech disturbances. No similar symptoms in the past. No significant motor deficits on 1 side of the body compared to the other.   REVIEW OF SYSTEMS:  CONSTITUTIONAL:  No fever, fatigue. No weakness. Positive for right facial droop.  EYES:  No glaucoma or cataracts.  EAR, NOSE, THROAT: No pain. No hearing loss.  RESPIRATORY: No cough. No wheezing. No hemoptysis.  CARDIOVASCULAR: No chest pain, orthopnea, palpitations.  GASTROINTESTINAL: No nausea. No vomiting. No diarrhea.  HEMATOLOGIC: No anemia or easy bruising.  MUSCULOSKELETAL: No pain in her shoulders or joints.  NEUROLOGICAL: No previous history of strokes or transient ischemic attacks.   PAST MEDICAL HISTORY: Includes hypertension, diabetes, hyperlipidemia.   HOME MEDICATIONS: Include atorvastatin, glipizide, hydrochlorothiazide, lisinopril, Januvia, pioglitazone.    ALLERGIES: No known drug allergies.   PAST SURGICAL HISTORY: A right hip replacement.   SOCIAL HISTORY: The patient stopped smoking since [place]. She was smoking 1/2 to 1/3 pack per day. No EtOH or drug use.   FAMILY HISTORY: Significant strong family history for coronary artery disease in multiple family members as well as hypothyroidism.   IMAGING: Status post MRI of the brain, o acute intracranial pathology was noted. Carotid Doppler no signs of hemodynamic stenosis bilaterally. CAT scan of the head no acute intracranial pathology  was noted as well.    PHYSICAL EXAMINATION: VITAL SIGNS: Temperature 97.3, pulse 80, respirations 18, blood pressure 114/71, pulse ox 97% on room air.  NEUROLOGIC: The patient is alert, awake, oriented to time, place, location and the reason why she is in the hospital. Facial sensation. She has a right facial droop. The rest of her cranial examination: Extraocular movements are intact. Pupils 3 mm and reactive to 2 mm, reactive bilaterally. Tongue is midline. Shoulder shrug intact. Motor strength examination appears to be questionable minor drift on the right upper extremity. Grip is intact. Coordination: Finger-to-nose intact. Sensation intact to light touch and temperature. Reflexes diminished throughout with  history of diabetes. Gait not assessed.   IMPRESSION: This is a 69 year old pleasant female with past medical history of hypertension, diabetes, hyperlipidemia, who was a former smoker, presenting with right facial droop which is still there but resolving and numbness in the right upper extremity that has improved and close to baseline. The patient's speech was never an issue. No focal motor deficits on 1 side or the other.   PLAN: This does not appear to be a peripheral process such as Bell's palsy as patient is able to fully close her eyes. Symptoms are improving but, at the same time, difficult to call TIA due to duration of the symptoms. MRI reviewed, no acute intracranial pathology. MRIs can still miss acute infarct, specifically thalamic infarct in nature. I agree with starting aspirin, which she has been on 81, and continue her home statin. Have physical therapy and occupational therapy see her, which I believe she will not have any issues passing with. She passed for diet. Probably discharge tomorrow a.m. Neurology followup at [facility] with Dr. Manuella Ghazi  and Dr. Melrose Nakayama.   Thank you. It was a pleasure seeing this patient.   ____________________________ Leotis Pain,  MD yz:cs D: 12/14/2013 17:48:00 ET T: 12/14/2013 18:02:39 ET JOB#: [id number]  cc: Leotis Pain, MD, <Dictator> Leotis Pain MD ELECTRONICALLY SIGNED 12/24/2013 11:33

## 2015-01-14 NOTE — Op Note (Signed)
PATIENT NAME:  [name] [name], [name] [name] MR#:  [id number] DATE OF BIRTH:  June 20, 1946  DATE OF PROCEDURE:  12/26/2013  PREOPERATIVE DIAGNOSIS: Acute cholecystitis, choledocholithiasis.   POSTOPERATIVE DIAGNOSIS: Acute cholecystitis, choledocholithiasis.   PROCEDURE PERFORMED: Laparoscopic cholecystectomy with intraoperative cholangiography.   SURGEON: Hortencia Conradi, MD  ASSISTANT: Scrub tech.  ANESTHESIA: General endotracheal.   FINDINGS: Moderately inflamed gallbladder. Cholangiogram demonstrated dilated duct and slow filling of the duodenum.   SPECIMENS: Gallbladder with contents to Pathology.   ESTIMATED BLOOD LOSS: 25 mL.   DRAINS: A 19 mm Blake drain in right upper quadrant gallbladder fossa.   DESCRIPTION OF PROCEDURE: Informed consent, supine position, general endotracheal anesthesia, sterile prep and drape of the abdomen. Timeout was observed. A 12 mm blunt Hassan trocar was placed through an infraumbilical transverse oriented skin incision, with stay sutures being passed through the fascia. Pneumoperitoneum was established. Remaining trocars were placed in the standard locations, a 5 mm in the epigastric, two 5 mm in the right lateral abdomen. The gallbladder was tensely distended and aspirated of approximately 20 mL of thin bile. The gallbladder was grasped along its fundus and elevated towards the right shoulder and laterally. Lateral traction was achieved on Hartmann's pouch. 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 It was doubly clipped on the portal side, singly clipped on the gallbladder side and divided. One small intervening lymphatic branch was divided between single hemoclips. Kumar clamp was then placed across the base of the gallbladder, and cholangiography was performed. The cystic duct was then triply clipped on the portal side, singly clipped on the gallbladder  side, and divided. The gallbladder was then retrieved off the gallbladder fossa utilizing hook cautery apparatus, placed into an Endo Catch device and retrieved. The right upper quadrant was irrigated with 1 liter of normal saline during the operation, aspirated dry, and point hemostasis obtained in the gallbladder fossa with electrocautery. Due to the inflamed nature of the gallbladder and slow filling of the duodenum, a 19 mm Blake drain was directed into the site of Morison's pouch and exited the lowermost right upper quadrant port site. Drain site was secured with nylon suture x 2. Ports were then removed under direct visualization. The infraumbilical fascial defect was closed with an additional figure-of-eight #0 Vicryl suture in vertical orientation. The stay sutures were tied to each other. A total of 30 mL of 0.25% plain Marcaine was infiltrated along all skin and fascial incisions prior to closure. Then 4-0 Vicryl subcuticular was applied to all skin edges, followed by application of 4-0 Vicryl subcuticular and 4-0 nylon. Sterile dressings were applied. Drain was placed to bulb suction. The patient was subsequently extubated and taken to the recovery room in stable and satisfactory condition by Anesthesia services.      ____________________________ Jeannette How Marina Gravel, MD mab:mr D: 12/26/2013 16:07:23 ET T: 12/26/2013 19:16:56 ET JOB#: [id number]  cc: Elta Guadeloupe A. Marina Gravel, MD, <Dictator> Hortencia Conradi MD ELECTRONICALLY SIGNED 12/28/2013 13:27

## 2015-01-14 NOTE — H&P (Signed)
PATIENT NAME:  [name] [name], [name] [name] MR#:  [id number] DATE OF BIRTH:  Sep 26, 1945  DATE OF ADMISSION:  12/13/2013  PRIMARY CARE PHYSICIAN: Alfredia Ferguson A. Elijio Miles, MD  CHIEF COMPLAINT: Facial droop.   HISTORY OF PRESENT ILLNESS: This is a very pleasant 69 year old female with a history of hypertension, diabetes, hyperlipidemia, former smoker, who presents with the above complaint. Since yesterday, the patient has noted facial droop on the right side. She did not want to come to the hospital, but her daughter noticed it and demanded that she come to the ER for further evaluation. In fact, she does continue to have right facial droop as well as right tongue deviation. No other symptoms are noted such as weakness, numbness, slurred speech.   REVIEW OF SYSTEMS:    CONSTITUTIONAL: No fever, fatigue, weakness.  EYES: Positive blurred vision associated with her facial droop. No glaucoma or cataracts. EARS, NOSE, THROAT: No ear pain, hearing loss, snoring, postnasal drip. RESPIRATORY: No cough, wheezing, hemoptysis, COPD.  CARDIOVASCULAR: No chest pain, orthopnea, palpitations, syncope, edema, arrhythmia.  GASTROINTESTINAL: No nausea, vomiting, diarrhea, abdominal pain, melena, or ulcers. GENITOURINARY: No dysuria or hematuria.   ENDOCRINE: No polyuria or polydipsia.  HEMATOLOGIC AND LYMPHATIC: No anemia or easy bruising.  SKIN: No rashes or lesions.  MUSCULOSKELETAL: No pain in the shoulders or joints. NEUROLOGIC: No history of CVA or TIA. PSYCHIATRIC: No history of anxiety or depression.  PAST MEDICAL HISTORY:  1.  Hypertension.  2.  Diabetes. 3.  Hyperlipidemia.   MEDICATIONS: 1.  Acarbose 100 mg b.i.d.  2.  Atorvastatin 20 mg at bedtime.  3.  Glipizide 10 mg b.i.d.  4.  HCTZ/lisinopril 12.5/20 daily.  5.  Januvia 100 mg daily. 6.  Pioglitazone 40 mg daily.   ALLERGIES: No known drug allergies.   PAST SURGICAL HISTORY: Right hip replacement.   SOCIAL HISTORY: The patient stopped smoking in  [place]. She was smoking a pack every 3 days. No alcohol or IV drug use.   FAMILY HISTORY: Positive for CAD and hypothyroidism.   PHYSICAL EXAMINATION: VITAL SIGNS: Temperature 98.5, pulse 86, respirations 22, blood pressure 126/79, 99% on room air.  GENERAL: The patient is alert, oriented, not in acute distress. HEENT: Head is atraumatic. Pupils are round and reactive. Sclerae anicteric. Mucous membranes are moist. Oropharynx is clear.  NECK: Supple without JVD, carotid bruit, or enlarged thyroid.  CARDIOVASCULAR: Regular rate and rhythm. No murmurs, gallops, or rubs. PMI is not displaced.  LUNGS: Clear to auscultation without crackles, rales, rhonchi, or wheezing. Normal to percussion. ABDOMEN: Bowel sounds are positive. Nontender, nondistended. No hepatosplenomegaly.  EXTREMITIES: No clubbing, cyanosis, or edema. NEUROLOGIC: The patient has a right facial droop with right tongue deviation. Strength is 5 out of 5 in all extremities. Sensation is intact bilaterally and symmetrically. Reflexes are 2+ bilaterally and symmetrically. All other cranial nerves are intact. Finger-to-nose is intact as well as heel-to-shin.  SKIN: Without rashes or lesions.   LABORATORY, DIAGNOSTIC, AND RADIOLOGICAL DATA: White blood cell count 7.7, hemoglobin 12, hematocrit 38, platelets 270. Sodium 140, potassium 4.7, chloride 107, bicarbonate 29, BUN 14, creatinine 1.08, glucose 123. Troponin less than 0.02. CT of the head shows no acute intracranial hemorrhage or CVA. EKG is normal sinus rhythm, no ST elevation or depression.   ASSESSMENT AND PLAN: This is a 69 year old female with hypertension, hyperlipidemia, diabetes, who presents with right facial droop and right tongue deviation consistent with cerebrovascular accident. 1.  Cerebrovascular accident: The patient has a right tongue deviation. It does  not appear to be a Bell's palsy. The patient will be admitted for cerebrovascular accident work-up including MRI,  carotid Doppler, echocardiogram. She does not take aspirin, which we will start at 81 mg daily. She did receive aspirin in the ER. Will also continue with her statin medication, checking lipid panel.  2.  Diabetes: The patient will continue on her outpatient medications, sliding-scale insulin, ADA diet.  3.  Hyperlipidemia: Continue atorvastatin. We will check fasting lipids.  4.  The patient is a full code status.   TIME SPENT: Approximately 50 minutes.    ____________________________ Donell Beers. Benjie Karvonen, MD spm:jcm D: 12/13/2013 17:20:07 ET T: 12/13/2013 18:31:30 ET JOB#: [id number]  cc: Teagen Bucio P. Benjie Karvonen, MD, <Dictator> Venetia Maxon. Elijio Miles, MD Donell Beers Teagyn Fishel MD ELECTRONICALLY SIGNED 12/15/2013 21:32

## 2015-01-14 NOTE — Consult Note (Signed)
Pt with ascending cholangitis due to CBD obstruction, stone likely passed.  For gallbladder removal today.  Would recommend a week of broad spectrum antibiotic such as Levaquin or Augmentin for a week in case any residual infection in biliary tree.  I will sign off, reconsult if needed.  Electronic Signatures: Manya Silvas (MD)  (Signed on 05-Apr-15 11:57)  Authored  Last Updated: 05-Apr-15 11:57 by Manya Silvas (MD)

## 2015-01-14 NOTE — Consult Note (Signed)
PATIENT NAME:  [name] [name], [name] [name] MR#:  [id number] DATE OF BIRTH:  29-Aug-1946  DATE OF CONSULTATION:  12/25/2013  CONSULTING PHYSICIAN:  Leotis Pain, MD  REASON FOR CONSULTATION: Clearance for surgery.   This is a female known to me, last seen on 12/14/2013 at [facility]. The reason for consultation was right facial droop and questionable right upper extremity numbness along with difficulty closing her right eye. MRI/MRA was negative. The patient went back to baseline, currently coming in with abdominal pain, found to have cholecystitis. 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  PAST MEDICAL HISTORY: Includes hypertension, hyperlipidemia, diabetes, previous history of strokes, atrial fibrillation.   HOME MEDICATIONS: Include pioglitazone, Januvia, lisinopril, glipizide, atorvastatin, aspirin 81, and acarbose.   SOCIAL HISTORY: History of smoking.   FAMILY HISTORY: Positive history of coronary artery disease.  REVIEW OF SYSTEMS: No jaundice, weakness. No change vision. No change in hearing. No cough. No shortness of breath. No palpitations. No history of nausea. No easy bruising or bleeding. No joint pains or aches. Neurologically, questionable TIA versus history of Bell's palsy.   LABORATORY DATA: Work-up has been reviewed.   PHYSICAL EXAMINATION: The patient is alert, awake, oriented to time, place, location. Able to tell me the current date and time. Able to tell how many quarters are in $1.50. Able to tell me the President of the [place]. Cranial nerve examination: Extraocular movements are intact. The patient has difficulty closing her right eye completely. Pupils 4 mm to 2 mm, reactive bilaterally. Facial sensation is intact. Slight right facial droop is noted. Motor strength examination appears to be 5/5 bilateral upper and lower extremities.  Sensation intact to light touch and temperature. Coordination: Finger-to-nose intact. Gait not assessed. Reflexes 2+ symmetrical.   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 No current complaints except abdominal pain. Asked  to be seen by surgery for clearance for cholecystectomy.   PLAN: At this point, I will clear the patient for cholecystectomy. I believe the risk of stroke preop or postop is very low, especially since I believe the diagnosis is likely Bell's palsy rather than a stroke.   Thank you, it was a pleasure seeing this patient. Please call with any questions.   ____________________________ Leotis Pain, MD yz:jcm D: 12/25/2013 18:31:17 ET T: 12/25/2013 21:19:42 ET JOB#: [id number]  cc: Leotis Pain, MD, <Dictator> Leotis Pain MD ELECTRONICALLY SIGNED 01/19/2014 11:30

## 2015-05-13 IMAGING — CT CT ABD-PELV W/ CM
2 of 5 series · 15 of 46 positions shown, 17 images · IV contrast (isovue)
Comparison: Abdominal radiographs obtained earlier today

CLINICAL DATA: General abdominal pain, vomiting, heart fluttering

EXAM:
CT ABDOMEN AND PELVIS WITH CONTRAST
TECHNIQUE: Multidetector CT imaging of the abdomen and pelvis was performed
using the standard protocol following bolus administration of
intravenous contrast.
CONTRAST:  85 mL Isovue 370 administered intravenously

[Series 2: routine abd pel with · axial · 0.70mm/px · z∈[-332,+68]mm · 12 of 90 slices shown, 14 images]
[im 5/90  soft-tissue]
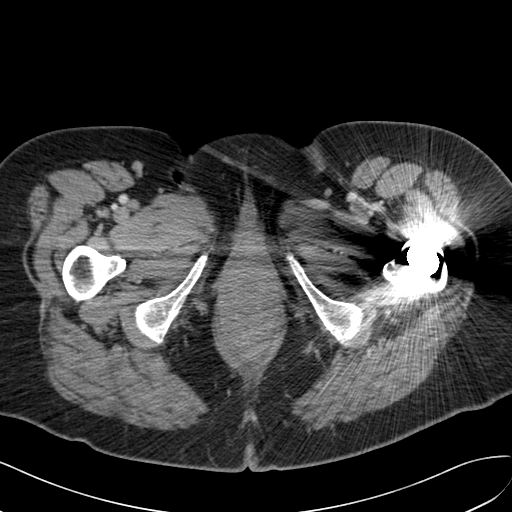
[im 5/90  bone]
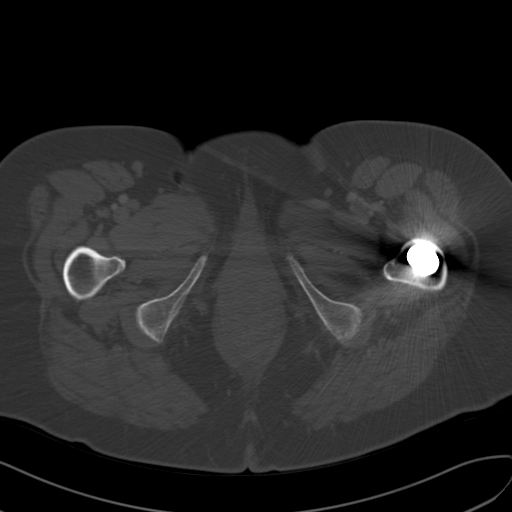
[im 15/90  soft-tissue]
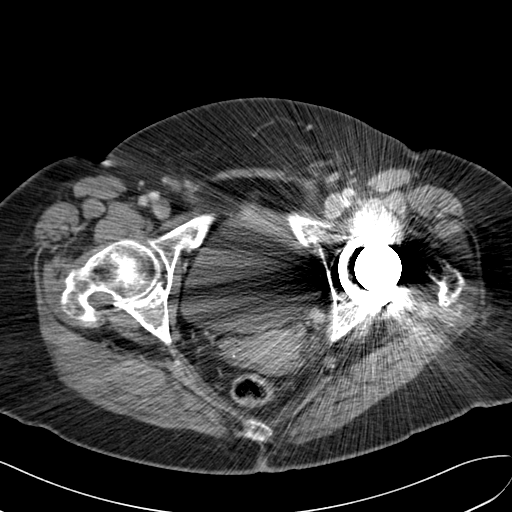
[im 19/90  soft-tissue]
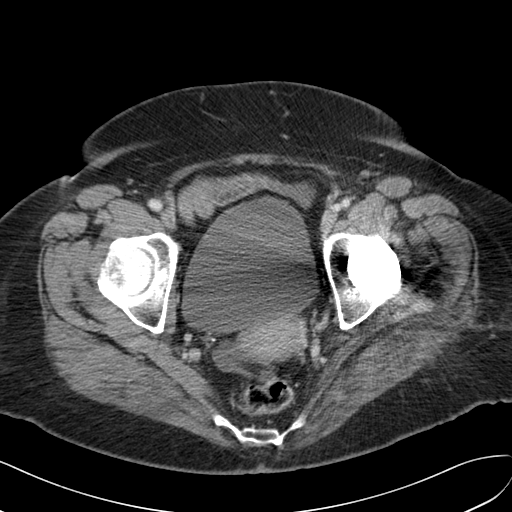
[im 29/90  soft-tissue]
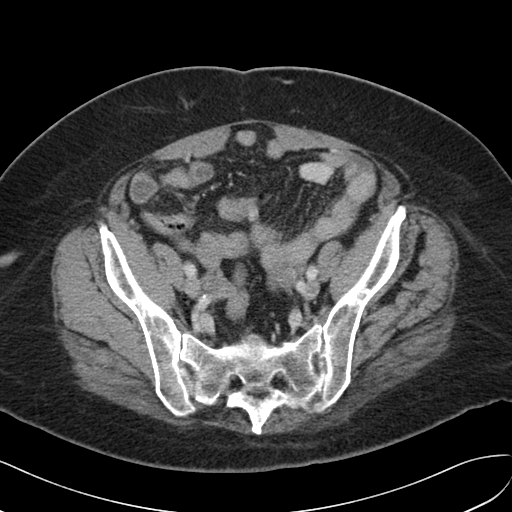
[im 33/90  soft-tissue]
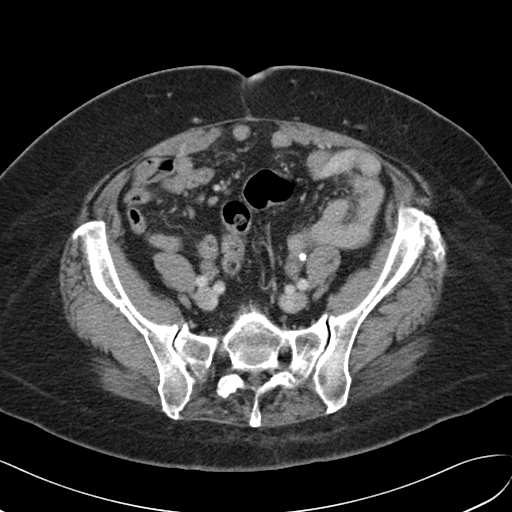
[im 43/90  soft-tissue]
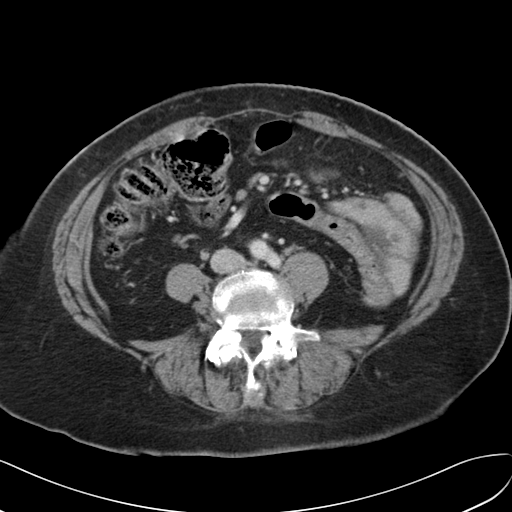
[im 47/90  soft-tissue]
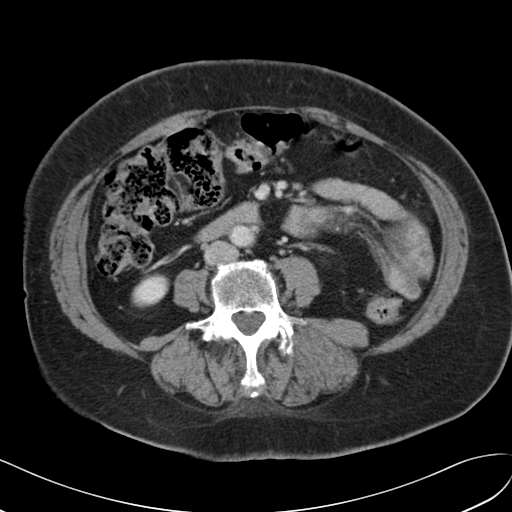
[im 57/90  soft-tissue]
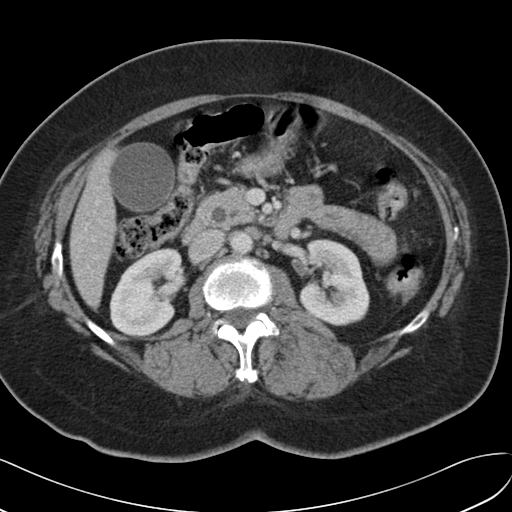
[im 61/90  soft-tissue]
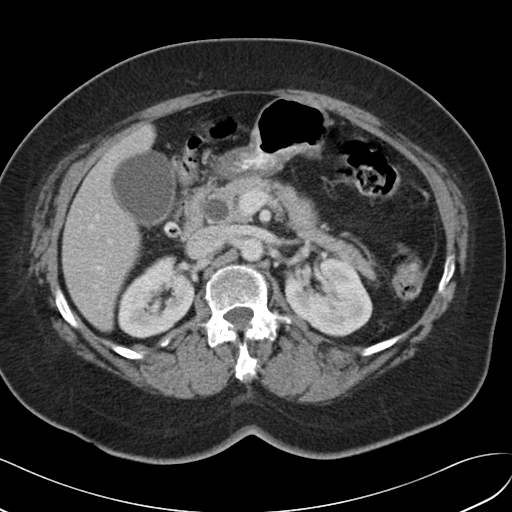
[im 61/90  bone]
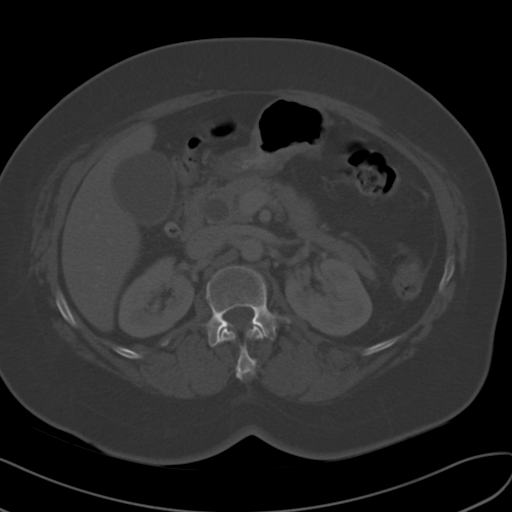
[im 71/90  soft-tissue]
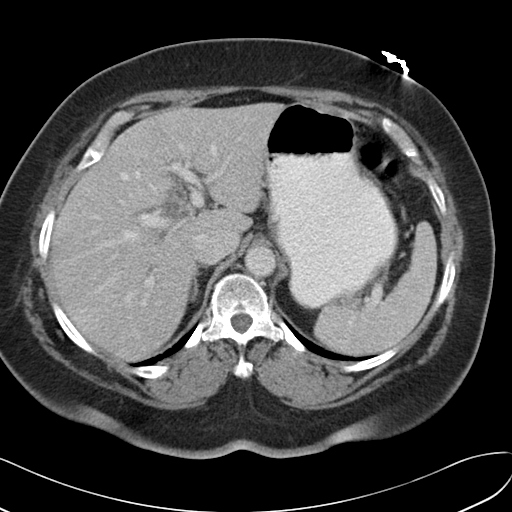
[im 75/90  soft-tissue]
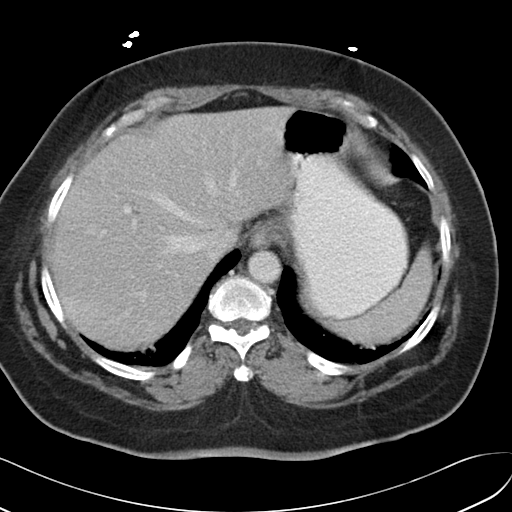
[im 85/90  soft-tissue]
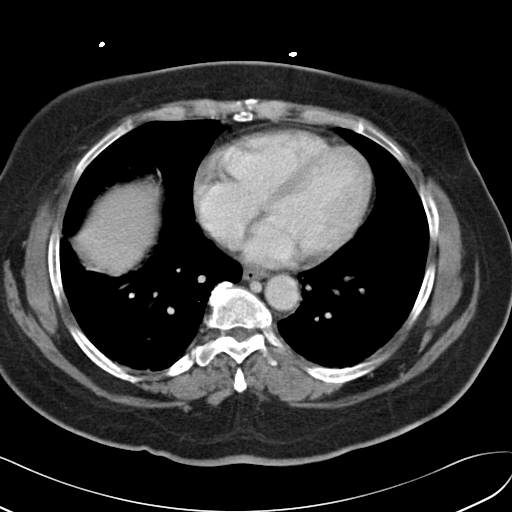

[Series 5: cor routine abd pel with · coronal · 0.74mm/px · 3 of 137 slices shown]
[im 46/137  soft-tissue]
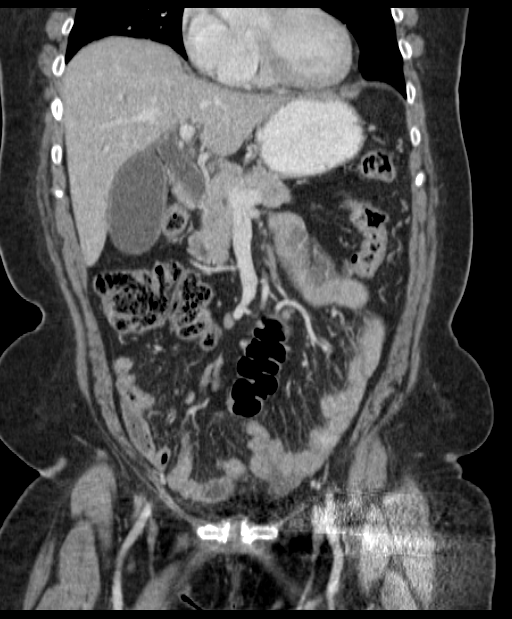
[im 61/137  soft-tissue]
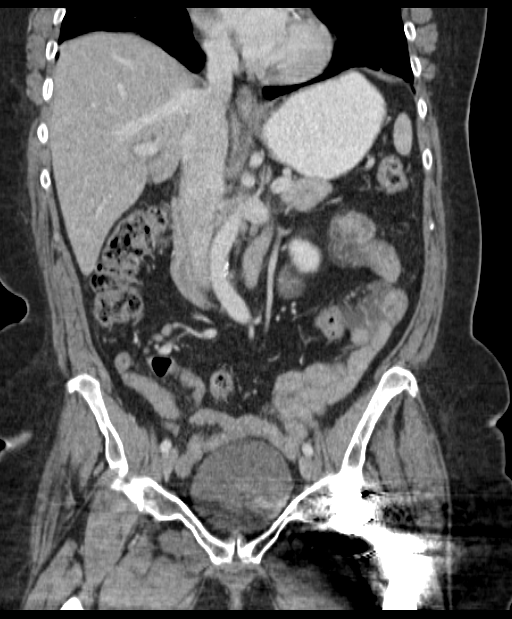
[im 76/137  soft-tissue]
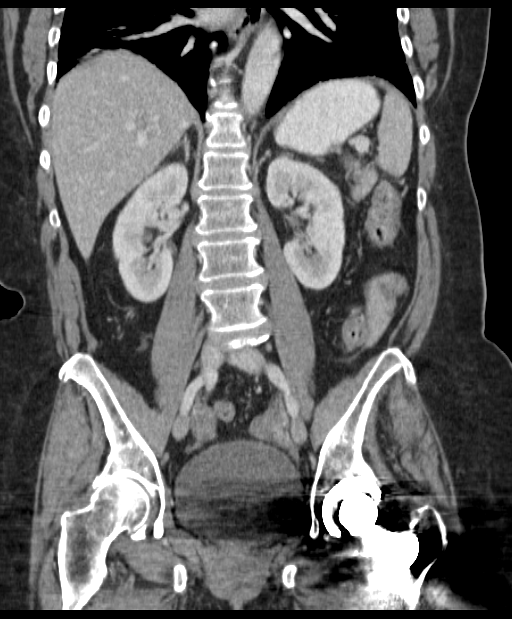

[15 of 46 positions shown; findings below may reference images not displayed]

FINDINGS: Lower Chest: Mild dependent atelectasis in the lower lobes.
Visualized cardiac structures within normal limits for size. No
pericardial effusion. Unremarkable visualized distal thoracic
esophagus.

Abdomen: Unremarkable CT appearance of the stomach, duodenum,
spleen, adrenal glands. Normal hepatic contours and morphology. No
discrete hepatic lesion. The gallbladder is mildly distended.
Gallbladder wall thickening to 4.4 mm is noted. No definite
radiopaque cholelithiasis. However, the common bile duct is dilated
measuring up to 1.6 cm in the pancreatic head. Distally, the bile
duct tapers abruptly at the ampulla. The main pancreatic duct is
also mildly dilated to 5 mm yielding a "double duct sign". No
choledocholithiasis or obstructing mass identified. The pancreatic
head and uncinate process are well defined.

Unremarkable appearance of the bilateral kidneys. No focal solid
lesion, hydronephrosis or nephrolithiasis.

Colonic diverticular disease without CT evidence of active
inflammation. No evidence of obstruction. No focal bowel wall
thickening. The appendix is not identified and is likely surgically
absent. There is no inflammatory change in the right lower quadrant.
No free fluid or suspicious adenopathy.

Pelvis: Unremarkable bladder, uterus and adnexa. Trace free fluid in
the recto uterine recess of [place]. No suspicious adenopathy.
Dystrophic calcification affiliated with the left ovary. Pelvic
floor laxity.

Bones/Soft Tissues: Surgical changes of right total hip arthroplasty
incompletely imaged. No hardware complication in the visualized
portions. No acute fracture or lytic or blastic osseous lesion.
Multilevel degenerative disc disease and right lower lumbar facet
arthropathy.

Vascular: Atherosclerotic vascular disease without significant
stenosis or aneurysmal dilatation.
IMPRESSION: 1. Mild gallbladder distention and diffuse gallbladder wall
thickening. Differential considerations include acute and chronic
cholecystitis. No definite cholelithiasis by CT. Consider further
evaluation with right upper quadrant ultrasound to assess for acute
cholecystitis.
2. Double duct sign with dilatation of the main pancreatic and
common bile duct with abrupt tapering in the pancreatic head. No
definite choledocholithiasis or definite mass lesion identified.
Differential considerations include distal biliary stricture, occult
choledocholithiasis, and occult pancreatic head or ampullary mass.
Recommend clinical correlation with serum LFTs and bilirubin. ERCP
or MRCP could further evaluate.
3. Colonic diverticulosis without evidence of diverticulitis.

## 2015-06-16 ENCOUNTER — Encounter: Payer: Self-pay | Admitting: Emergency Medicine

## 2015-06-16 ENCOUNTER — Emergency Department
Admission: EM | Admit: 2015-06-16 | Discharge: 2015-06-16 | Disposition: A | Discharge status: Home / Self Care | Payer: Medicare Other | Attending: Emergency Medicine | Admitting: Emergency Medicine

## 2015-06-16 DIAGNOSIS — Y990 Civilian activity done for income or pay: Secondary | ICD-10-CM | POA: Diagnosis not present

## 2015-06-16 DIAGNOSIS — Y9289 Other specified places as the place of occurrence of the external cause: Secondary | ICD-10-CM | POA: Insufficient documentation

## 2015-06-16 DIAGNOSIS — R21 Rash and other nonspecific skin eruption: Secondary | ICD-10-CM | POA: Diagnosis not present

## 2015-06-16 DIAGNOSIS — S99922A Unspecified injury of left foot, initial encounter: Secondary | ICD-10-CM | POA: Diagnosis present

## 2015-06-16 DIAGNOSIS — W4909XA Other specified item causing external constriction, initial encounter: Secondary | ICD-10-CM | POA: Insufficient documentation

## 2015-06-16 DIAGNOSIS — E119 Type 2 diabetes mellitus without complications: Secondary | ICD-10-CM | POA: Diagnosis not present

## 2015-06-16 DIAGNOSIS — L03116 Cellulitis of left lower limb: Secondary | ICD-10-CM | POA: Diagnosis not present

## 2015-06-16 DIAGNOSIS — Y9389 Activity, other specified: Secondary | ICD-10-CM | POA: Diagnosis not present

## 2015-06-16 DIAGNOSIS — Z87891 Personal history of nicotine dependence: Secondary | ICD-10-CM | POA: Insufficient documentation

## 2015-06-16 HISTORY — DX: Type 2 diabetes mellitus without complications: E11.9

## 2015-06-16 LAB — BASIC METABOLIC PANEL
Anion gap: 5 (ref 5–15)
BUN: 14 mg/dL (ref 6–20)
CALCIUM: 9 mg/dL (ref 8.9–10.3)
CO2: 24 mmol/L (ref 22–32)
CREATININE: 0.97 mg/dL (ref 0.44–1.00)
Chloride: 109 mmol/L (ref 101–111)
GFR calc non Af Amer: 59 mL/min — ABNORMAL LOW (ref >60)
Glucose, Bld: 110 mg/dL — ABNORMAL HIGH (ref 65–99)
Potassium: 4.1 mmol/L (ref 3.5–5.1)
SODIUM: 138 mmol/L (ref 135–145)

## 2015-06-16 LAB — CBC
HEMATOCRIT: 34.3 % — AB (ref 35.0–47.0)
Hemoglobin: 11.4 g/dL — ABNORMAL LOW (ref 12.0–16.0)
MCH: 29 pg (ref 26.0–34.0)
MCHC: 33.2 g/dL (ref 32.0–36.0)
MCV: 87.5 fL (ref 80.0–100.0)
Platelets: 254 10*3/uL (ref 150–440)
RBC: 3.93 MIL/uL (ref 3.80–5.20)
RDW: 15.2 % — AB (ref 11.5–14.5)
WBC: 8.1 10*3/uL (ref 3.6–11.0)

## 2015-06-16 LAB — GLUCOSE, CAPILLARY: Glucose-Capillary: 84 mg/dL (ref 65–99)

## 2015-06-16 MED ORDER — CLINDAMYCIN PHOSPHATE 600 MG/50ML IV SOLN
600.0000 mg | Freq: Once | INTRAVENOUS | Status: AC
  Administered 2015-06-16: 600 mg via INTRAVENOUS
  Filled 2015-06-16: qty 50

## 2015-06-16 MED ORDER — CLINDAMYCIN HCL 150 MG PO CAPS: 300.0000 mg | ORAL_CAPSULE | Freq: Three times a day (TID) | ORAL | Status: AC

## 2015-06-16 NOTE — ED Notes (Signed)
Pt given apple juice and graham crackers 

## 2015-06-16 NOTE — ED Notes (Signed)
Pt states she bought a new pair of sneakers and they rubbed a blister on left foot. Clear fluid filled blister noted on top of left foot.  +PMS

## 2015-06-16 NOTE — Discharge Instructions (Signed)

## 2015-06-16 NOTE — ED Notes (Signed)
Patient with no complaints at this time. Respirations even and unlabored. Skin warm/dry. Discharge instructions reviewed with patient at this time. Patient given opportunity to voice concerns/ask questions. IV removed per policy and band-aid applied to site. Patient discharged at this time and left Emergency Department, via wheelchair.   

## 2015-06-16 NOTE — ED Provider Notes (Signed)
CSN: [id number]     Arrival date & time 06/16/15  1817 History   First MD Initiated Contact with Patient 06/16/15 2033     Chief Complaint  Patient presents with  . Foot Injury     (Consider location/radiation/quality/duration/timing/severity/associated sxs/prior Treatment) HPI  69 year old female presents to the emergency department for evaluation of left foot pain and swelling. Pain is mild, located on the dorsum of the left foot. She's had swelling has been present for 1 day. Today she developed warmth and redness along the swelling on the dorsum of her left foot. Patient states it is because she wore new shoes one day ago at work. Patient was seen at the walk-in clinic earlier today, sent to the emergency department due to streaking from the dorsum of the foot to the anterior distal shin. She denies any fevers. She is able to ambulate. There is a small blister that has developed on the distal dorsum aspect. Patient is diabetic but denies any neuropathy. She has not had any medications for pain. She has not been on any antibiotic's.  Past Medical History  Diagnosis Date  . Diabetes mellitus without complication    Past Surgical History  Procedure Laterality Date  . Cholecystectomy    . Hip surgery     History reviewed. No pertinent family history. Social History  Substance Use Topics  . Smoking status: Former Research scientist (life sciences)  . Smokeless tobacco: None  . Alcohol Use: None   OB History    No data available     Review of Systems  Constitutional: Negative for fever, chills, activity change and fatigue.  HENT: Negative for congestion, sinus pressure and sore throat.   Eyes: Negative for visual disturbance.  Respiratory: Negative for cough, chest tightness and shortness of breath.   Cardiovascular: Negative for chest pain and leg swelling.  Gastrointestinal: Negative for nausea, vomiting, abdominal pain and diarrhea.  Genitourinary: Negative for dysuria.  Musculoskeletal: Negative for  arthralgias and gait problem.  Skin: Positive for rash.  Neurological: Negative for weakness, numbness and headaches.  Hematological: Negative for adenopathy.  Psychiatric/Behavioral: Negative for behavioral problems, confusion and agitation.      Allergies  Review of patient's allergies indicates no known allergies.  Home Medications   Prior to Admission medications   Medication Sig Start Date End Date Taking? Authorizing Provider  clindamycin (CLEOCIN) 150 MG capsule Take 2 capsules (300 mg total) by mouth 3 (three) times daily. 06/16/15 06/26/15  Duanne Guess, PA-C   BP 123/84 mmHg  Pulse 94  Temp(Src) 98.3 F (36.8 C) (Oral)  Resp 16  Ht 5\' 4"  (1.626 m)  Wt 196 lb (88.905 kg)  BMI 33.63 kg/m2  SpO2 98% Physical Exam  Constitutional: She is oriented to person, place, and time. She appears well-developed and well-nourished. No distress.  HENT:  Head: Normocephalic and atraumatic.  Mouth/Throat: Oropharynx is clear and moist.  Eyes: EOM are normal. Pupils are equal, round, and reactive to light. Right eye exhibits no discharge. Left eye exhibits no discharge.  Neck: Normal range of motion. Neck supple.  Cardiovascular: Normal rate, regular rhythm and intact distal pulses.   Pulmonary/Chest: Effort normal and breath sounds normal. No respiratory distress. She exhibits no tenderness.  Abdominal: Soft. She exhibits no distension. There is no tenderness.  Musculoskeletal: Normal range of motion. She exhibits no edema.  Left foot is tender on the dorsum with soft tissue swelling present. There is warmth and erythema on the dorsal aspect of the left foot with  erythematous streaks to the anterior distal tibia just above the ankle joint. There is no evidence of skin breakdown or ulceration noted. There is a bullous blister 3 x 3 cm at the distal second and third metatarsal.   Neurological: She is alert and oriented to person, place, and time. She has normal reflexes.  Skin: Skin is  warm and dry. There is erythema.  Psychiatric: She has a normal mood and affect. Her behavior is normal. Thought content normal.    ED Course  Procedures (including critical care time) Labs Review Labs Reviewed  CBC - Abnormal; Notable for the following:    Hemoglobin 11.4 (*)    HCT 34.3 (*)    RDW 15.2 (*)    All other components within normal limits  BASIC METABOLIC PANEL - Abnormal; Notable for the following:    Glucose, Bld 110 (*)    GFR calc non Af Amer 59 (*)    All other components within normal limits  GLUCOSE, CAPILLARY    Imaging Review No results found. I have personally reviewed and evaluated these images and lab results as part of my medical decision-making.   EKG Interpretation None      MDM   Final diagnoses:  Cellulitis of left foot    69 year old female with 1 day of left foot redness and warmth. There is no skin breakdown or ulceration noted. She appears to have cellulitis that is streaking up the anterior ankle joint just above the ankle mortise. Area of erythema was marked with a skin pen. She was given clindamycin 600 mg IV followed by clindamycin by mouth for 10 days. He'll follow-up with PCP in 3 days for skin check. Return to the ER for any fevers worsening symptoms increased pain or swelling. Vital signs are stable, she is afebrile. She is able to ambulate, CBC and BMP are all within normal limits.    Duanne Guess, PA-C 06/16/15 2130  Wandra Arthurs, MD 06/16/15 [phone]

## 2015-06-29 ENCOUNTER — Encounter: Payer: Self-pay | Admitting: Podiatry

## 2015-06-29 ENCOUNTER — Ambulatory Visit (INDEPENDENT_AMBULATORY_CARE_PROVIDER_SITE_OTHER): Payer: Medicare Other | Admitting: Podiatry

## 2015-06-29 VITALS — BP 121/70 | HR 93 | Resp 18

## 2015-06-29 DIAGNOSIS — E119 Type 2 diabetes mellitus without complications: Secondary | ICD-10-CM | POA: Diagnosis not present

## 2015-06-29 DIAGNOSIS — R238 Other skin changes: Secondary | ICD-10-CM

## 2015-06-29 DIAGNOSIS — L84 Corns and callosities: Secondary | ICD-10-CM | POA: Diagnosis not present

## 2015-06-29 DIAGNOSIS — L988 Other specified disorders of the skin and subcutaneous tissue: Secondary | ICD-10-CM | POA: Diagnosis not present

## 2015-06-29 MED ORDER — CEPHALEXIN 500 MG PO CAPS: 500.0000 mg | ORAL_CAPSULE | Freq: Three times a day (TID) | ORAL | Status: DC

## 2015-06-29 NOTE — Progress Notes (Signed)
   Subjective:    Patient ID: [name] [name], female    DOB: September 04, 1946, 70 y.o.   MRN: [id number]  HPI  69 year old female presents the office as concerns the posterior to the top of her left foot in ongoing for the last couple weeks. She states that she did purchase a new shoe on a Wednesday following Friday she noticed a blister. Also prior to the posterior form she was soaking her foot in water for approximately one hour. After this the patient is a small blister form. She went to work with a new shoes and due to the pain she had to leave work and she went to a walk-in clinic. She was in the emergency room if she had red streaks. She was given a dose of IV antibiotics and discharged home with clindamycin. She states that she just finished a course of clindamycin on Friday. She did followup with her primary care doctor and the blister was drained. She has a clear drainage came from the blister. She currently states that there is no pain on the blister although does remain somewhat swollen. She denies any redness streaks or any redness from the area. She denies any systemic complaints as fevers, chills, nausea, vomiting. She is diabetic and she states her blood sugar is "good". No other complaints at this time.   Review of Systems  All other systems reviewed and are negative.      Objective:   Physical Exam AAO x3, NAD DP/PT pulses palpable bilaterally, CRT less than 3 seconds Protective sensation intact with Simms Weinstein monofilament, vibratory sensation intact, Achilles tendon reflex intact On the dorsal aspect of left midfoot along the dorsal metatarsal heads 2 through 4 disc distal to the sulcus the toes there is evidence of a flat appearing dried, drained bulla which is starting to callus over. There does appear to be a slight amount of warmth around the area without any surrounding cellulitis or erythema. There is localized edema around the area. Subjective the she states this is greatly  improved. There also appears to be epidermal lysis in the fourth interspace of the left foot. Upon debridement the underlying skin is intact. There are no other open lesions or pre-ulcer lesions identified bilaterally. There is no pain with calf compression, swelling, warmth, erythema. No other areas of tenderness to bilateral lower extremities. MMT 5/5, ROM WNL No areas of tenderness to bilateral lower extremities. MMT 5/5, ROM WNL.  No open lesions or pre-ulcerative lesions.  No overlying edema, erythema, increase in warmth to bilateral lower extremities.  No pain with calf compression, swelling, warmth, erythema bilaterally.       Assessment & Plan:  69 year old female resulting bulla left foot with apparently resolving infection -Treatment options discussed including all alternatives, risks, and complications -Due to the slight amount of warmth and continued edema will continue antibiotics. We will changed to Keflex 500 mg 3 times a day. -surgical shoe dispensed help take pressure off the area. -She can apply a small amount of antibiotic ointment over on the dorsal dried blood. None applied between the toes. -prescription for diabetic shoes were given to the patient. -Monitor for any clinical signs or symptoms of infection and directed to call the office immediately should any occur or go to the ER. -Follow-up [facility] or sooner if any problems arise. In the meantime, encouraged to call the office with any questions, concerns, change in symptoms.   Celesta Gentile, DPM  Celesta Gentile, DPM

## 2015-07-11 ENCOUNTER — Telehealth: Payer: Self-pay | Admitting: *Deleted

## 2015-07-11 NOTE — Telephone Encounter (Addendum)
Faxed signed Certifying Physician form and 06/29/2015 chart notes.

## 2015-07-13 ENCOUNTER — Ambulatory Visit: Payer: Medicare Other | Admitting: Podiatry

## 2015-07-20 ENCOUNTER — Ambulatory Visit: Payer: Medicare Other | Admitting: Podiatry

## 2016-07-04 ENCOUNTER — Other Ambulatory Visit: Payer: Self-pay | Admitting: Internal Medicine

## 2016-07-04 DIAGNOSIS — Z1231 Encounter for screening mammogram for malignant neoplasm of breast: Secondary | ICD-10-CM

## 2016-07-22 ENCOUNTER — Other Ambulatory Visit: Payer: Self-pay | Admitting: Anesthesiology

## 2016-07-22 DIAGNOSIS — M79604 Pain in right leg: Secondary | ICD-10-CM

## 2016-07-22 DIAGNOSIS — G8929 Other chronic pain: Secondary | ICD-10-CM

## 2016-07-22 DIAGNOSIS — M5441 Lumbago with sciatica, right side: Principal | ICD-10-CM

## 2016-08-01 ENCOUNTER — Ambulatory Visit: Payer: Medicare Other

## 2016-08-02 ENCOUNTER — Ambulatory Visit: Payer: Medicare Other

## 2016-08-02 ENCOUNTER — Ambulatory Visit
Admission: RE | Admit: 2016-08-02 | Discharge: 2016-08-02 | Disposition: A | Discharge status: Home / Self Care | Payer: Medicare Other | Source: Ambulatory Visit | Attending: Anesthesiology | Admitting: Anesthesiology

## 2016-08-02 DIAGNOSIS — G8929 Other chronic pain: Secondary | ICD-10-CM

## 2016-08-02 DIAGNOSIS — M5441 Lumbago with sciatica, right side: Principal | ICD-10-CM

## 2016-08-02 DIAGNOSIS — M79604 Pain in right leg: Secondary | ICD-10-CM

## 2016-08-08 ENCOUNTER — Ambulatory Visit
Admission: RE | Admit: 2016-08-08 | Discharge: 2016-08-08 | Disposition: A | Discharge status: Home / Self Care | Payer: Medicare Other | Source: Ambulatory Visit | Attending: Internal Medicine | Admitting: Internal Medicine

## 2016-08-08 DIAGNOSIS — Z1231 Encounter for screening mammogram for malignant neoplasm of breast: Secondary | ICD-10-CM | POA: Insufficient documentation

## 2016-08-09 ENCOUNTER — Ambulatory Visit
Admission: RE | Admit: 2016-08-09 | Discharge: 2016-08-09 | Disposition: A | Discharge status: Home / Self Care | Payer: Medicare Other | Source: Ambulatory Visit | Attending: Anesthesiology | Admitting: Anesthesiology

## 2016-08-09 DIAGNOSIS — M47816 Spondylosis without myelopathy or radiculopathy, lumbar region: Secondary | ICD-10-CM | POA: Diagnosis not present

## 2016-08-09 DIAGNOSIS — M1288 Other specific arthropathies, not elsewhere classified, other specified site: Secondary | ICD-10-CM | POA: Diagnosis not present

## 2016-08-09 DIAGNOSIS — M5441 Lumbago with sciatica, right side: Secondary | ICD-10-CM | POA: Insufficient documentation

## 2016-08-09 DIAGNOSIS — M5126 Other intervertebral disc displacement, lumbar region: Secondary | ICD-10-CM | POA: Diagnosis not present

## 2016-08-09 DIAGNOSIS — M48061 Spinal stenosis, lumbar region without neurogenic claudication: Secondary | ICD-10-CM | POA: Diagnosis not present

## 2016-11-08 ENCOUNTER — Inpatient Hospital Stay: Payer: Medicare Other | Attending: Oncology | Admitting: Oncology

## 2016-11-08 ENCOUNTER — Inpatient Hospital Stay: Payer: Medicare Other

## 2016-11-08 ENCOUNTER — Encounter: Payer: Self-pay | Admitting: Oncology

## 2016-11-08 VITALS — BP 116/70 | HR 81 | Temp 97.5°F | Wt 206.2 lb

## 2016-11-08 DIAGNOSIS — M545 Low back pain: Secondary | ICD-10-CM | POA: Diagnosis not present

## 2016-11-08 DIAGNOSIS — Z7982 Long term (current) use of aspirin: Secondary | ICD-10-CM | POA: Diagnosis not present

## 2016-11-08 DIAGNOSIS — E1122 Type 2 diabetes mellitus with diabetic chronic kidney disease: Secondary | ICD-10-CM | POA: Insufficient documentation

## 2016-11-08 DIAGNOSIS — G8929 Other chronic pain: Secondary | ICD-10-CM | POA: Insufficient documentation

## 2016-11-08 DIAGNOSIS — Z79899 Other long term (current) drug therapy: Secondary | ICD-10-CM | POA: Diagnosis not present

## 2016-11-08 DIAGNOSIS — Z87891 Personal history of nicotine dependence: Secondary | ICD-10-CM

## 2016-11-08 DIAGNOSIS — E119 Type 2 diabetes mellitus without complications: Secondary | ICD-10-CM | POA: Insufficient documentation

## 2016-11-08 DIAGNOSIS — D472 Monoclonal gammopathy: Secondary | ICD-10-CM

## 2016-11-08 DIAGNOSIS — I129 Hypertensive chronic kidney disease with stage 1 through stage 4 chronic kidney disease, or unspecified chronic kidney disease: Secondary | ICD-10-CM | POA: Diagnosis not present

## 2016-11-08 DIAGNOSIS — Z7984 Long term (current) use of oral hypoglycemic drugs: Secondary | ICD-10-CM | POA: Insufficient documentation

## 2016-11-08 DIAGNOSIS — N183 Chronic kidney disease, stage 3 (moderate): Secondary | ICD-10-CM | POA: Diagnosis not present

## 2016-11-08 DIAGNOSIS — Z9049 Acquired absence of other specified parts of digestive tract: Secondary | ICD-10-CM | POA: Diagnosis not present

## 2016-11-08 LAB — CBC WITH DIFFERENTIAL/PLATELET
Basophils Absolute: 0 10*3/uL (ref 0–0.1)
Basophils Relative: 0 %
Eosinophils Absolute: 0.1 10*3/uL (ref 0–0.7)
Eosinophils Relative: 1 %
HCT: 34 % — ABNORMAL LOW (ref 35.0–47.0)
HEMOGLOBIN: 11.5 g/dL — AB (ref 12.0–16.0)
LYMPHS ABS: 1.6 10*3/uL (ref 1.0–3.6)
LYMPHS PCT: 26 %
MCH: 30.1 pg (ref 26.0–34.0)
MCHC: 33.8 g/dL (ref 32.0–36.0)
MCV: 89 fL (ref 80.0–100.0)
Monocytes Absolute: 0.5 10*3/uL (ref 0.2–0.9)
Monocytes Relative: 8 %
NEUTROS PCT: 65 %
Neutro Abs: 4 10*3/uL (ref 1.4–6.5)
Platelets: 256 10*3/uL (ref 150–440)
RBC: 3.82 MIL/uL (ref 3.80–5.20)
RDW: 15.1 % — ABNORMAL HIGH (ref 11.5–14.5)
WBC: 6.1 10*3/uL (ref 3.6–11.0)

## 2016-11-08 LAB — LACTATE DEHYDROGENASE: LDH: 148 U/L (ref 98–192)

## 2016-11-08 LAB — COMPREHENSIVE METABOLIC PANEL
ALK PHOS: 64 U/L (ref 38–126)
ALT: 15 U/L (ref 14–54)
AST: 19 U/L (ref 15–41)
Albumin: 4.1 g/dL (ref 3.5–5.0)
Anion gap: 4 — ABNORMAL LOW (ref 5–15)
BUN: 22 mg/dL — ABNORMAL HIGH (ref 6–20)
CALCIUM: 9 mg/dL (ref 8.9–10.3)
CO2: 25 mmol/L (ref 22–32)
CREATININE: 1.1 mg/dL — AB (ref 0.44–1.00)
Chloride: 106 mmol/L (ref 101–111)
GFR, EST AFRICAN AMERICAN: 58 mL/min — AB (ref >60)
GFR, EST NON AFRICAN AMERICAN: 50 mL/min — AB (ref >60)
Glucose, Bld: 126 mg/dL — ABNORMAL HIGH (ref 65–99)
Potassium: 4 mmol/L (ref 3.5–5.1)
Sodium: 135 mmol/L (ref 135–145)
Total Bilirubin: 0.6 mg/dL (ref 0.3–1.2)
Total Protein: 8.5 g/dL — ABNORMAL HIGH (ref 6.5–8.1)

## 2016-11-08 NOTE — Progress Notes (Signed)
Patient denies pain or discomfort at this time, vitals stable and documented.  Patient brought to exam room 6

## 2016-11-08 NOTE — Progress Notes (Signed)
Hematology/Oncology Consult note [facility] [phone] [phone]  Patient Care Team: Jodi Marble, MD as PCP - General (Internal Medicine)   Name of the patient: [name] [name]  [id number]  20-Jun-1946    Reason for referral- monoclonal protein in serum   Referring physician- Dr. Holley Raring  Date of visit: 11/08/16   History of presenting illness- patient is a 71 year old female with a history of stage III CKD who was seen by Dr. Holley Raring on 10/23/2016. She had a workup for her chronic kidney disease and was found to have an abnormal SPEP showing an M spike of 1.7 g/dL and was referred to hematology for the same. BMP from 09/03/2016 showed BUN of 21 and creatinine of 1.04. Results of blood work from 10/24/2016 were as follows BMP revealed a normal calcium of 9.5 BUN and creatinine 19/1.19 respectively M spike of 1.7 g on SPEP that no M spike was noted in random urine sample  Currently patient reporst problems due to chronic low back pain. Denies other complaints  ECOG PS- 1  Pain scale- 4   Review of systems- Review of Systems  Constitutional: Negative for chills, fever, malaise/fatigue and weight loss.  HENT: Negative for congestion, ear discharge and nosebleeds.   Eyes: Negative for blurred vision.  Respiratory: Negative for cough, hemoptysis, sputum production, shortness of breath and wheezing.   Cardiovascular: Negative for chest pain, palpitations, orthopnea and claudication.  Gastrointestinal: Negative for abdominal pain, blood in stool, constipation, diarrhea, heartburn, melena, nausea and vomiting.  Genitourinary: Negative for dysuria, flank pain, frequency, hematuria and urgency.  Musculoskeletal: Positive for back pain. Negative for joint pain and myalgias.  Skin: Negative for rash.  Neurological: Negative for dizziness, tingling, focal weakness, seizures, weakness and headaches.  Endo/Heme/Allergies: Does not bruise/bleed  easily.  Psychiatric/Behavioral: Negative for depression and suicidal ideas. The patient does not have insomnia.     No Known Allergies  Patient Active Problem List   Diagnosis Date Noted  . Diabetes mellitus type 2, uncomplicated ([place]) 86/76/7209     Past Medical History:  Diagnosis Date  . Diabetes mellitus without complication [facility])      Past Surgical History:  Procedure Laterality Date  . CHOLECYSTECTOMY    . HIP SURGERY      Social History   Social History  . Marital status: Married    Spouse name: N/A  . Number of children: N/A  . Years of education: N/A   Occupational History  . Not on file.   Social History Main Topics  . Smoking status: Former Research scientist (life sciences)  . Smokeless tobacco: Not on file  . Alcohol use Not on file  . Drug use: Unknown  . Sexual activity: Not on file   Other Topics Concern  . Not on file   Social History Narrative  . No narrative on file     No family history on file.   Current Outpatient Prescriptions:  .  acarbose (PRECOSE) 100 MG tablet, Take by mouth., Disp: , Rfl:  .  aspirin 325 MG tablet, Take by mouth., Disp: , Rfl:  .  atorvastatin (LIPITOR) 20 MG tablet, Take by mouth., Disp: , Rfl:  .  glipiZIDE (GLUCOTROL) 10 MG tablet, , Disp: , Rfl: 0 .  lisinopril-hydrochlorothiazide (PRINZIDE,ZESTORETIC) 20-12.5 MG tablet, , Disp: , Rfl: 0 .  pioglitazone (ACTOS) 45 MG tablet, , Disp: , Rfl: 0 .  sitaGLIPtin (JANUVIA) 100 MG tablet, Take by mouth., Disp: , Rfl:  .  atorvastatin (LIPITOR) 20  MG tablet, , Disp: , Rfl: 0 .  cephALEXin (KEFLEX) 500 MG capsule, Take 1 capsule (500 mg total) by mouth 3 (three) times daily. (Patient not taking: Reported on 11/08/2016), Disp: 30 capsule, Rfl: 2 .  lisinopril (PRINIVIL,ZESTRIL) 20 MG tablet, Take by mouth., Disp: , Rfl:  .  pioglitazone (ACTOS) 15 MG tablet, Take by mouth., Disp: , Rfl:  .  UNABLE TO FIND, , Disp: , Rfl:    Physical exam:  Vitals:   11/08/16 1102  BP: 116/70  Pulse: 81    Temp: 97.5 F (36.4 C)  TempSrc: Tympanic  Weight: 206 lb 3.9 oz (93.6 kg)   Physical Exam  Constitutional: She is oriented to person, place, and time and well-developed, well-nourished, and in no distress.  HENT:  Head: Normocephalic and atraumatic.  Eyes: EOM are normal. Pupils are equal, round, and reactive to light.  Neck: Normal range of motion.  Cardiovascular: Normal rate, regular rhythm and normal heart sounds.   Pulmonary/Chest: Effort normal and breath sounds normal.  Abdominal: Soft. Bowel sounds are normal.  Neurological: She is alert and oriented to person, place, and time.  Skin: Skin is warm and dry.       CMP Latest Ref Rng & Units 06/16/2015  Glucose 65 - 99 mg/dL 110(H)  BUN 6 - 20 mg/dL 14  Creatinine 0.44 - 1.00 mg/dL 0.97  Sodium 135 - 145 mmol/L 138  Potassium 3.5 - 5.1 mmol/L 4.1  Chloride 101 - 111 mmol/L 109  CO2 22 - 32 mmol/L 24  Calcium 8.9 - 10.3 mg/dL 9.0  Total Protein 6.4 - 8.2 g/dL -  Total Bilirubin 0.2 - 1.0 mg/dL -  Alkaline Phos Unit/L -  AST 15 - 37 Unit/L -  ALT 12 - 78 U/L -   CBC Latest Ref Rng & Units 06/16/2015  WBC 3.6 - 11.0 K/uL 8.1  Hemoglobin 12.0 - 16.0 g/dL 11.4(L)  Hematocrit 35.0 - 47.0 % 34.3(L)  Platelets 150 - 440 K/uL 254    Assessment and plan- Patient is a 71 y.o. female who has been referred to [place] for monoclonal protein found in seated and likely MGUS  Today I will obtain a repeat CBC, CMP, myeloma panel, LDH, beta-2 glycoprotein, kappa lambda free light chain on 24-hour urine protein along with UPEP and urine immunofixation. I will also obtain skeletal survey. I will see the patient back in 2 weeks' time to discuss the results of her blood work and decide at that time if a bone marrow biopsy would be warranted   Thank you for this kind referral and the opportunity to participate in the care of this patient   Visit Diagnosis 1. MGUS (monoclonal gammopathy of unknown significance)     Dr. Randa Evens, MD,  [facility] Pager- [phone] 11/08/2016 2:51 PM

## 2016-11-11 LAB — BETA 2 MICROGLOBULIN, SERUM: Beta-2 Microglobulin: 2 mg/L (ref 0.6–2.4)

## 2016-11-12 LAB — MULTIPLE MYELOMA PANEL, SERUM
ALBUMIN SERPL ELPH-MCNC: 3.8 g/dL (ref 2.9–4.4)
ALBUMIN/GLOB SERPL: 1 (ref 0.7–1.7)
Alpha 1: 0.3 g/dL (ref 0.0–0.4)
Alpha2 Glob SerPl Elph-Mcnc: 0.6 g/dL (ref 0.4–1.0)
B-Globulin SerPl Elph-Mcnc: 1.1 g/dL (ref 0.7–1.3)
GAMMA GLOB SERPL ELPH-MCNC: 2.2 g/dL — AB (ref 0.4–1.8)
GLOBULIN, TOTAL: 4.2 — AB (ref 2.2–3.9)
IGA: 157 mg/dL (ref 87–352)
IGM, SERUM: 133 mg/dL (ref 26–217)
IgG (Immunoglobin G), Serum: 2353 mg/dL — ABNORMAL HIGH (ref 700–1600)
M Protein SerPl Elph-Mcnc: 1.7 g/dL — ABNORMAL HIGH
Total Protein ELP: 8 g/dL (ref 6.0–8.5)

## 2016-11-13 DIAGNOSIS — D472 Monoclonal gammopathy: Secondary | ICD-10-CM | POA: Diagnosis not present

## 2016-11-14 ENCOUNTER — Ambulatory Visit
Admission: RE | Admit: 2016-11-14 | Discharge: 2016-11-14 | Disposition: A | Discharge status: Home / Self Care | Payer: Medicare Other | Source: Ambulatory Visit | Attending: Oncology | Admitting: Oncology

## 2016-11-14 ENCOUNTER — Other Ambulatory Visit: Payer: Self-pay | Admitting: *Deleted

## 2016-11-14 DIAGNOSIS — M818 Other osteoporosis without current pathological fracture: Secondary | ICD-10-CM | POA: Diagnosis not present

## 2016-11-14 DIAGNOSIS — D472 Monoclonal gammopathy: Secondary | ICD-10-CM

## 2016-11-14 DIAGNOSIS — Z96642 Presence of left artificial hip joint: Secondary | ICD-10-CM | POA: Insufficient documentation

## 2016-11-15 LAB — IFE+PROTEIN ELECTRO, 24-HR UR
% BETA, URINE: 0 %
ALBUMIN, U: 0 %
ALPHA 1 URINE: 0 %
ALPHA 2 UR: 0 %
GAMMA GLOBULIN URINE: 0 %
TOTAL PROTEIN, URINE-UR/DAY: 100 mg/(24.h) (ref 30–150)
Total Protein, Urine: 4.8 mg/dL
Total Volume: 2075

## 2016-11-22 ENCOUNTER — Encounter: Payer: Self-pay | Admitting: Oncology

## 2016-11-22 ENCOUNTER — Inpatient Hospital Stay: Payer: Medicare Other | Attending: Oncology | Admitting: Oncology

## 2016-11-22 VITALS — BP 131/77 | HR 77 | Temp 98.1°F | Resp 18 | Wt 208.3 lb

## 2016-11-22 DIAGNOSIS — Z7984 Long term (current) use of oral hypoglycemic drugs: Secondary | ICD-10-CM | POA: Insufficient documentation

## 2016-11-22 DIAGNOSIS — Z79899 Other long term (current) drug therapy: Secondary | ICD-10-CM | POA: Diagnosis not present

## 2016-11-22 DIAGNOSIS — D472 Monoclonal gammopathy: Secondary | ICD-10-CM | POA: Diagnosis not present

## 2016-11-22 DIAGNOSIS — I129 Hypertensive chronic kidney disease with stage 1 through stage 4 chronic kidney disease, or unspecified chronic kidney disease: Secondary | ICD-10-CM | POA: Diagnosis not present

## 2016-11-22 DIAGNOSIS — N183 Chronic kidney disease, stage 3 (moderate): Secondary | ICD-10-CM | POA: Diagnosis not present

## 2016-11-22 DIAGNOSIS — G8929 Other chronic pain: Secondary | ICD-10-CM

## 2016-11-22 DIAGNOSIS — M549 Dorsalgia, unspecified: Secondary | ICD-10-CM | POA: Diagnosis not present

## 2016-11-22 DIAGNOSIS — Z87891 Personal history of nicotine dependence: Secondary | ICD-10-CM | POA: Diagnosis not present

## 2016-11-22 DIAGNOSIS — Z9049 Acquired absence of other specified parts of digestive tract: Secondary | ICD-10-CM | POA: Insufficient documentation

## 2016-11-22 DIAGNOSIS — Z7982 Long term (current) use of aspirin: Secondary | ICD-10-CM | POA: Diagnosis not present

## 2016-11-22 DIAGNOSIS — M1611 Unilateral primary osteoarthritis, right hip: Secondary | ICD-10-CM | POA: Insufficient documentation

## 2016-11-22 DIAGNOSIS — E119 Type 2 diabetes mellitus without complications: Secondary | ICD-10-CM

## 2016-11-22 NOTE — Progress Notes (Signed)
Patient offers no complaints today other than right leg pain which is coming from her back.

## 2016-11-22 NOTE — Progress Notes (Signed)
Hematology/Oncology Consult note [facility]  Telephone:([phone] Fax:([phone]  Patient Care Team: Jodi Marble, MD as PCP - General (Internal Medicine)   Name of the patient: [name] [name]  [id number]  1946/02/03   Date of visit: 11/22/16  Diagnosis- IgG MGUS  Chief complaint/ Reason for visit- discuss results of bloodwork  Heme/Onc history: patient is a 71 year old female with a history of stage III CKD who was seen by Dr. Holley Raring on 10/23/2016. She had a workup for her chronic kidney disease and was found to have an abnormal SPEP showing an M spike of 1.7 g/dL and was referred to hematology for the same. BMP from 09/03/2016 showed BUN of 21 and creatinine of 1.04. Results of blood work from 10/24/2016 were as follows BMP revealed a normal calcium of 9.5 BUN and creatinine 19/1.19 respectively M spike of 1.7 g on SPEP that no M spike was noted in random urine sample  Results of blood work from 11/08/2016 were as follows: CBC showed white count of 6.1, H&H of 11.5/34 and a platelet count of 256. CMP was within normal limits with notable values for BUN-creatinine at 22/1.1 and serum calcium of 9. Multiple myeloma panel showed an elevated IgG level of 2353 and a monoclonal M protein of 1.7 g IgG kappa light chain specificity. LDH was within normal limits. Beta-2 microglobulin was normal at 2.0. And 24 hour urine protein revealed 100 mg of protein in 24 hours and no monoclonal protein noted  Skeletal survey did not reveal any evidence of lytic lesions  Interval history- overall she feels well and anxious about knowing today's bloodwork. She has some chronic back pain but denies other complaints  ECOG PS- 1  Review of systems- Review of Systems  Constitutional: Negative for chills, fever, malaise/fatigue and weight loss.  HENT: Negative for congestion, ear discharge and nosebleeds.   Eyes: Negative for blurred vision.  Respiratory: Negative for  cough, hemoptysis, sputum production, shortness of breath and wheezing.   Cardiovascular: Negative for chest pain, palpitations, orthopnea and claudication.  Gastrointestinal: Negative for abdominal pain, blood in stool, constipation, diarrhea, heartburn, melena, nausea and vomiting.  Genitourinary: Negative for dysuria, flank pain, frequency, hematuria and urgency.  Musculoskeletal: Negative for back pain, joint pain and myalgias.  Skin: Negative for rash.  Neurological: Negative for dizziness, tingling, focal weakness, seizures, weakness and headaches.  Endo/Heme/Allergies: Does not bruise/bleed easily.  Psychiatric/Behavioral: Negative for depression and suicidal ideas. The patient does not have insomnia.      Current treatment- observation  No Known Allergies   Past Medical History:  Diagnosis Date  . Diabetes mellitus without complication [facility])      Past Surgical History:  Procedure Laterality Date  . CHOLECYSTECTOMY    . HIP SURGERY      Social History   Social History  . Marital status: Married    Spouse name: N/A  . Number of children: N/A  . Years of education: N/A   Occupational History  . Not on file.   Social History Main Topics  . Smoking status: Former Research scientist (life sciences)  . Smokeless tobacco: Never Used  . Alcohol use Not on file  . Drug use: Unknown  . Sexual activity: Not on file   Other Topics Concern  . Not on file   Social History Narrative  . No narrative on file    No family history on file.   Current Outpatient Prescriptions:  .  acarbose (PRECOSE) 100 MG tablet, Take  by mouth., Disp: , Rfl:  .  aspirin 325 MG tablet, Take by mouth., Disp: , Rfl:  .  atorvastatin (LIPITOR) 20 MG tablet, Take by mouth., Disp: , Rfl:  .  atorvastatin (LIPITOR) 20 MG tablet, , Disp: , Rfl: 0 .  cephALEXin (KEFLEX) 500 MG capsule, Take 1 capsule (500 mg total) by mouth 3 (three) times daily., Disp: 30 capsule, Rfl: 2 .  glipiZIDE (GLUCOTROL) 10 MG tablet, , Disp: ,  Rfl: 0 .  lisinopril (PRINIVIL,ZESTRIL) 20 MG tablet, Take by mouth., Disp: , Rfl:  .  lisinopril-hydrochlorothiazide (PRINZIDE,ZESTORETIC) 20-12.5 MG tablet, , Disp: , Rfl: 0 .  pioglitazone (ACTOS) 15 MG tablet, Take by mouth., Disp: , Rfl:  .  pioglitazone (ACTOS) 45 MG tablet, , Disp: , Rfl: 0 .  sitaGLIPtin (JANUVIA) 100 MG tablet, Take by mouth., Disp: , Rfl:  .  UNABLE TO FIND, , Disp: , Rfl:   Physical exam:  Vitals:   11/22/16 1147  BP: 131/77  Pulse: 77  Resp: 18  Temp: 98.1 F (36.7 C)  TempSrc: Tympanic  Weight: 208 lb 5.4 oz (94.5 kg)   Physical Exam  Constitutional: She is oriented to person, place, and time and well-developed, well-nourished, and in no distress.  HENT:  Head: Normocephalic and atraumatic.  Eyes: EOM are normal. Pupils are equal, round, and reactive to light.  Neck: Normal range of motion.  Cardiovascular: Normal rate, regular rhythm and normal heart sounds.   Pulmonary/Chest: Effort normal and breath sounds normal.  Abdominal: Soft. Bowel sounds are normal.  Neurological: She is alert and oriented to person, place, and time.  Skin: Skin is warm and dry.     CMP Latest Ref Rng & Units 11/08/2016  Glucose 65 - 99 mg/dL 126(H)  BUN 6 - 20 mg/dL 22(H)  Creatinine 0.44 - 1.00 mg/dL 1.10(H)  Sodium 135 - 145 mmol/L 135  Potassium 3.5 - 5.1 mmol/L 4.0  Chloride 101 - 111 mmol/L 106  CO2 22 - 32 mmol/L 25  Calcium 8.9 - 10.3 mg/dL 9.0  Total Protein 6.5 - 8.1 g/dL 8.5(H)  Total Bilirubin 0.3 - 1.2 mg/dL 0.6  Alkaline Phos 38 - 126 U/L 64  AST 15 - 41 U/L 19  ALT 14 - 54 U/L 15   CBC Latest Ref Rng & Units 11/08/2016  WBC 3.6 - 11.0 K/uL 6.1  Hemoglobin 12.0 - 16.0 g/dL 11.5(L)  Hematocrit 35.0 - 47.0 % 34.0(L)  Platelets 150 - 440 K/uL 256    No images are attached to the encounter.  Dg Bone Survey Met  Result Date: 11/14/2016 CLINICAL DATA:  Monoclonal gammopathy EXAM: METASTATIC BONE SURVEY COMPARISON:  Chest radiograph December 23, 2013  FINDINGS: Skull:  No blastic or lytic bone lesions. Cervical spine: There is degenerative change at multiple levels. No blastic or lytic bone lesions. Thoracic spine: Multilevel degenerative change. No blastic or lytic bone lesions. Lumbar spine: There is extensive arthropathy. No blastic or lytic bone lesions. Chest: There is no edema or consolidation. Heart size and pulmonary vascular normal. No adenopathy. There is degenerative change in each shoulder. There are no blastic or lytic bone lesions. 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 There is periarticular osteoporosis. No evident blastic or lytic bone lesions. Right femur: No blastic or lytic bone lesions. There is arthropathy in the right hip joint. Left femur: Total hip replacement on the left. No blastic or lytic bone lesions. Right  tibia and fibula:  No blastic or lytic bone lesions. Left tibia and fibula: No blastic or lytic bone lesions. Right shoulder and humerus: Degenerative changes noted in the right shoulder. No blastic or lytic bone lesions. Left shoulder and humerus: There is degenerative change in the left shoulder. No blastic or lytic bone lesions. Right forearm:  No blastic or lytic bone lesions. Left forearm:  No blastic or lytic bone lesions. IMPRESSION: No blastic or lytic bone lesions are evident. There is periarticular osteoporosis, most noted in the pelvis region. Total hip replacement on the left. No adenopathy appreciable in the chest. There are multifocal areas of degenerative type change. Electronically Signed   By: Lowella Grip III M.D.   On: 11/14/2016 09:46     Assessment and plan- Patient is a 71 y.o. female who has been referred to [place] for IgG kappa MGUS  I discussed the results of the blood work as well as Marine scientist with the patient. Given that she has IgG monoclonal protein of greater than 1.5 g in her serum, I would like to get a bone  marrow biopsy to a certain if she has evidence of smoldering multiple myeloma or overt multiple myeloma. She does not have any end organ damage such as hypercalcemia, renal failure, bone lesions or anemia with a hemoglobin of less than 10 to have a myeloma defining event. I discussed the risks and benefits of bone marrow biopsy and patient would like to think about it and get back to [place] next week.  She does need some additional blood work to complete her anemia workup and I would like to order ferritin and iron studies, B12, folate, TSH, reticulocyte count and serum free light chains when she comes to get her bone marrow biopsy. If she decides not to pursue bone marrow biopsy, I will see her back in 3 months time with a repeat CBC, CMP myeloma panel and serum free light chain ratio.   Total face to face encounter time for this patient visit was 30 min. >50% of the time was  spent in counseling and coordination of care.      Visit Diagnosis 1. MGUS (monoclonal gammopathy of unknown significance)      Dr. Randa Evens, MD, MPH [facility] at [facility] Pager- [phone] 11/22/2016 2:19 PM

## 2016-11-27 ENCOUNTER — Telehealth: Payer: Self-pay | Admitting: *Deleted

## 2016-11-27 NOTE — Telephone Encounter (Signed)
Dr Janese Banks wanted me to call and check to see if pt had decided whether she wanted to move forward with having bone marrow bx.  I called the house and phone rang lots of times but no answer and no voicemail.  Will try back later.

## 2016-12-06 ENCOUNTER — Telehealth: Payer: Self-pay | Admitting: *Deleted

## 2016-12-06 NOTE — Telephone Encounter (Signed)
Called several times but always in afternoon.  I had spoke to pt's husband and he had told me to call in am and I had forgot that pt had said that.  I spoke to her today and asked her if she had decided about whether or not she will have bone marrow bx.  Pt decided not to have bone marrow bx.  The second plan if she chose not to do the bone marros is see pt back in 3 months with labs.  I will enter an electronic message and send to sch. With lab and md and they will call pt with appt. She states she wants early am and call her in am

## 2016-12-09 ENCOUNTER — Other Ambulatory Visit: Payer: Self-pay | Admitting: *Deleted

## 2016-12-09 DIAGNOSIS — D472 Monoclonal gammopathy: Secondary | ICD-10-CM

## 2016-12-09 DIAGNOSIS — D649 Anemia, unspecified: Secondary | ICD-10-CM

## 2017-02-21 ENCOUNTER — Inpatient Hospital Stay: Payer: Medicare Other | Admitting: Oncology

## 2017-02-21 ENCOUNTER — Inpatient Hospital Stay: Payer: Medicare Other

## 2017-03-03 DIAGNOSIS — M1611 Unilateral primary osteoarthritis, right hip: Secondary | ICD-10-CM | POA: Insufficient documentation

## 2017-03-03 NOTE — Progress Notes (Deleted)
Hematology/Oncology Consult note [facility]  Telephone:([phone] Fax:([phone]  Patient Care Team: Jodi Marble, MD as PCP - General (Internal Medicine)   Name of the patient: [name] [name]  [id number]  1945/11/16   Date of visit: 03/03/17  Diagnosis- IgG MGUS  Chief complaint/ Reason for visit- routine f/u  Heme/Onc history: patient is a 71 year old female with a history of stage III CKD who was seen by Dr. Holley Raring on 10/23/2016. She had a workup for her chronic kidney disease and was found to have an abnormal SPEP showing an M spike of 1.7 g/dL and was referred to hematology for the same. BMP from 09/03/2016 showed BUN of 21 and creatinine of 1.04. Results of blood work from 10/24/2016 were as follows BMP revealed a normal calcium of 9.5 BUN and creatinine 19/1.19 respectively M spike of 1.7 g on SPEP that no M spike was noted in random urine sample  Results of blood work from 11/08/2016 were as follows: CBC showed white count of 6.1, H&H of 11.5/34 and a platelet count of 256. CMP was within normal limits with notable values for BUN-creatinine at 22/1.1 and serum calcium of 9. Multiple myeloma panel showed an elevated IgG level of 2353 and a monoclonal M protein of 1.7 g IgG kappa light chain specificity. LDH was within normal limits. Beta-2 microglobulin was normal at 2.0. And 24 hour urine protein revealed 100 mg of protein in 24 hours and no monoclonal protein noted  Skeletal survey did not reveal any evidence of lytic lesions  Risks and benefits of doing bone marrow biopsy were discussed with the patient and she decided not to pursue it at this time  Interval history- ***   Review of systems- Review of Systems  Constitutional: Negative for chills, fever, malaise/fatigue and weight loss.  HENT: Negative for congestion, ear discharge and nosebleeds.   Eyes: Negative for blurred vision.  Respiratory: Negative for cough, hemoptysis, sputum  production, shortness of breath and wheezing.   Cardiovascular: Negative for chest pain, palpitations, orthopnea and claudication.  Gastrointestinal: Negative for abdominal pain, blood in stool, constipation, diarrhea, heartburn, melena, nausea and vomiting.  Genitourinary: Negative for dysuria, flank pain, frequency, hematuria and urgency.  Musculoskeletal: Negative for back pain, joint pain and myalgias.  Skin: Negative for rash.  Neurological: Negative for dizziness, tingling, focal weakness, seizures, weakness and headaches.  Endo/Heme/Allergies: Does not bruise/bleed easily.  Psychiatric/Behavioral: Negative for depression and suicidal ideas. The patient does not have insomnia.        No Known Allergies   Past Medical History:  Diagnosis Date  . Diabetes mellitus without complication [facility])      Past Surgical History:  Procedure Laterality Date  . CHOLECYSTECTOMY    . HIP SURGERY      Social History   Social History  . Marital status: Married    Spouse name: N/A  . Number of children: N/A  . Years of education: N/A   Occupational History  . Not on file.   Social History Main Topics  . Smoking status: Former Research scientist (life sciences)  . Smokeless tobacco: Never Used  . Alcohol use Not on file  . Drug use: Unknown  . Sexual activity: Not on file   Other Topics Concern  . Not on file   Social History Narrative  . No narrative on file    No family history on file.   Current Outpatient Prescriptions:  .  acarbose (PRECOSE) 100 MG tablet, Take by mouth.,  Disp: , Rfl:  .  aspirin 325 MG tablet, Take by mouth., Disp: , Rfl:  .  atorvastatin (LIPITOR) 20 MG tablet, Take by mouth., Disp: , Rfl:  .  atorvastatin (LIPITOR) 20 MG tablet, , Disp: , Rfl: 0 .  cephALEXin (KEFLEX) 500 MG capsule, Take 1 capsule (500 mg total) by mouth 3 (three) times daily., Disp: 30 capsule, Rfl: 2 .  glipiZIDE (GLUCOTROL) 10 MG tablet, , Disp: , Rfl: 0 .  lisinopril (PRINIVIL,ZESTRIL) 20 MG tablet,  Take by mouth., Disp: , Rfl:  .  lisinopril-hydrochlorothiazide (PRINZIDE,ZESTORETIC) 20-12.5 MG tablet, , Disp: , Rfl: 0 .  pioglitazone (ACTOS) 15 MG tablet, Take by mouth., Disp: , Rfl:  .  pioglitazone (ACTOS) 45 MG tablet, , Disp: , Rfl: 0 .  sitaGLIPtin (JANUVIA) 100 MG tablet, Take by mouth., Disp: , Rfl:  .  UNABLE TO FIND, , Disp: , Rfl:   Physical exam: There were no vitals filed for this visit. Physical Exam  Constitutional: She is oriented to person, place, and time and well-developed, well-nourished, and in no distress.  HENT:  Head: Normocephalic and atraumatic.  Eyes: EOM are normal. Pupils are equal, round, and reactive to light.  Neck: Normal range of motion.  Cardiovascular: Normal rate, regular rhythm and normal heart sounds.   Pulmonary/Chest: Effort normal and breath sounds normal.  Abdominal: Soft. Bowel sounds are normal.  Neurological: She is alert and oriented to person, place, and time.  Skin: Skin is warm and dry.     CMP Latest Ref Rng & Units 11/08/2016  Glucose 65 - 99 mg/dL 126(H)  BUN 6 - 20 mg/dL 22(H)  Creatinine 0.44 - 1.00 mg/dL 1.10(H)  Sodium 135 - 145 mmol/L 135  Potassium 3.5 - 5.1 mmol/L 4.0  Chloride 101 - 111 mmol/L 106  CO2 22 - 32 mmol/L 25  Calcium 8.9 - 10.3 mg/dL 9.0  Total Protein 6.5 - 8.1 g/dL 8.5(H)  Total Bilirubin 0.3 - 1.2 mg/dL 0.6  Alkaline Phos 38 - 126 U/L 64  AST 15 - 41 U/L 19  ALT 14 - 54 U/L 15   CBC Latest Ref Rng & Units 11/08/2016  WBC 3.6 - 11.0 K/uL 6.1  Hemoglobin 12.0 - 16.0 g/dL 11.5(L)  Hematocrit 35.0 - 47.0 % 34.0(L)  Platelets 150 - 440 K/uL 256     Assessment and plan- Patient is a 71 y.o. female with IgG MGUS     Visit Diagnosis No diagnosis found.   Dr. Randa Evens, MD, MPH [place] at [facility] Pager- [phone] 03/03/2017 4:36 PM

## 2017-03-04 ENCOUNTER — Inpatient Hospital Stay: Payer: Medicare Other | Admitting: Oncology

## 2017-03-04 ENCOUNTER — Inpatient Hospital Stay: Payer: Medicare Other

## 2017-03-25 ENCOUNTER — Inpatient Hospital Stay: Payer: Medicare Other | Admitting: Oncology

## 2017-03-25 ENCOUNTER — Inpatient Hospital Stay: Payer: Medicare Other

## 2017-04-15 ENCOUNTER — Encounter: Payer: Self-pay | Admitting: *Deleted

## 2017-04-15 ENCOUNTER — Inpatient Hospital Stay: Payer: Medicare Other

## 2017-04-15 ENCOUNTER — Inpatient Hospital Stay: Payer: Medicare Other | Admitting: Oncology

## 2017-04-16 ENCOUNTER — Inpatient Hospital Stay: Admission: RE | Admit: 2017-04-16 | Payer: Medicare Other | Source: Ambulatory Visit | Admitting: Specialist

## 2017-04-16 ENCOUNTER — Encounter: Admission: RE | Payer: Self-pay | Source: Ambulatory Visit

## 2017-04-16 SURGERY — ARTHROPLASTY, HIP, TOTAL,POSTERIOR APPROACH
Anesthesia: Choice | Laterality: Right

## 2017-04-21 ENCOUNTER — Telehealth: Payer: Self-pay | Admitting: *Deleted

## 2017-04-21 NOTE — Telephone Encounter (Signed)
Sent certified letter on 3/15 to discharge her from practice because she has missed 3 appt in a row.

## 2017-04-25 ENCOUNTER — Ambulatory Visit: Payer: Self-pay | Admitting: Orthopedic Surgery

## 2017-04-25 ENCOUNTER — Other Ambulatory Visit: Payer: Self-pay | Admitting: Orthopedic Surgery

## 2017-04-28 ENCOUNTER — Encounter
Admission: RE | Admit: 2017-04-28 | Discharge: 2017-04-28 | Disposition: A | Discharge status: Home / Self Care | Payer: Medicare Other | Source: Ambulatory Visit | Attending: Orthopedic Surgery | Admitting: Orthopedic Surgery

## 2017-04-28 DIAGNOSIS — Z01812 Encounter for preprocedural laboratory examination: Secondary | ICD-10-CM | POA: Insufficient documentation

## 2017-04-28 HISTORY — DX: Unspecified osteoarthritis, unspecified site: M19.90

## 2017-04-28 HISTORY — DX: Essential (primary) hypertension: I10

## 2017-04-28 HISTORY — DX: Cerebral infarction, unspecified: I63.9

## 2017-04-28 LAB — SURGICAL PCR SCREEN
MRSA, PCR: NEGATIVE
Staphylococcus aureus: NEGATIVE

## 2017-04-28 LAB — BASIC METABOLIC PANEL
Anion gap: 7 (ref 5–15)
BUN: 29 mg/dL — AB (ref 6–20)
CALCIUM: 9.5 mg/dL (ref 8.9–10.3)
CO2: 26 mmol/L (ref 22–32)
CREATININE: 1.09 mg/dL — AB (ref 0.44–1.00)
Chloride: 106 mmol/L (ref 101–111)
GFR calc Af Amer: 58 mL/min — ABNORMAL LOW (ref >60)
GFR, EST NON AFRICAN AMERICAN: 50 mL/min — AB (ref >60)
Glucose, Bld: 161 mg/dL — ABNORMAL HIGH (ref 65–99)
Potassium: 3.9 mmol/L (ref 3.5–5.1)
SODIUM: 139 mmol/L (ref 135–145)

## 2017-04-28 LAB — TYPE AND SCREEN
ABO/RH(D): O POS
Antibody Screen: NEGATIVE

## 2017-04-28 NOTE — Patient Instructions (Signed)
  Your procedure is scheduled on: Monday 05/05/17 Report to Day Surgery. To find out your arrival time please call [phone] between 1PM - 3PM on Friday 05/02/17.  Remember: Instructions that are not followed completely may result in serious medical risk, up to and including death, or upon the discretion of your surgeon and anesthesiologist your surgery may need to be rescheduled.    _x___ 1. Do not eat food or drink liquids after midnight. No gum chewing or hard candies.     __x__ 2. No Alcohol for 24 hours before or after surgery.   ____ 3. Do Not Smoke For 24 Hours Prior to Your Surgery.   ____ 4. Bring all medications with you on the day of surgery if instructed.    __x__ 5. Notify your doctor if there is any change in your medical condition     (cold, fever, infections).       Do not wear jewelry, make-up, hairpins, clips or nail polish.  Do not wear lotions, powders, or perfumes. You may wear deodorant.  Do not shave 48 hours prior to surgery. Men may shave face and neck.  Do not bring valuables to the hospital.    [facility] is not responsible for any belongings or valuables.               Contacts, dentures or bridgework may not be worn into surgery.  Leave your suitcase in the car. After surgery it may be brought to your room.  For patients admitted to the hospital, discharge time is determined by your                treatment team.   Patients discharged the day of surgery will not be allowed to drive home.   Please read over the following fact sheets that you were given:   MRSA Information   ____ Take these medicines the morning of surgery with A SIP OF WATER:    1.   2.   3.   4.  5.  6.  ____ Fleet Enema (as directed)   __x__ Use CHG Soap as directed  ____ Use inhalers on the day of surgery  ____ Stop metformin 2 days prior to surgery    ____ Take 1/2 of usual insulin dose the night before surgery and none on the morning of surgery.   __x__ Stop  aspirin on today  ____ Stop Anti-inflammatories on    ____ Stop supplements until after surgery.    ____ Bring C-Pap to the hospital.

## 2017-05-04 MED ORDER — CEFAZOLIN SODIUM-DEXTROSE 2-4 GM/100ML-% IV SOLN
2.0000 g | INTRAVENOUS | Status: AC
  Administered 2017-05-05: 2 g via INTRAVENOUS

## 2017-05-04 MED ORDER — TRANEXAMIC ACID 1000 MG/10ML IV SOLN
1000.0000 mg | INTRAVENOUS | Status: DC
  Filled 2017-05-04: qty 10

## 2017-05-05 ENCOUNTER — Encounter
Admission: RE | Disposition: A | Discharge status: Skilled Nursing Facility | Payer: Self-pay | Source: Ambulatory Visit | Attending: Orthopedic Surgery

## 2017-05-05 ENCOUNTER — Inpatient Hospital Stay: Payer: Medicare Other

## 2017-05-05 ENCOUNTER — Inpatient Hospital Stay: Payer: Medicare Other | Admitting: Certified Registered Nurse Anesthetist

## 2017-05-05 ENCOUNTER — Inpatient Hospital Stay
Admission: RE | Admit: 2017-05-05 | Discharge: 2017-05-07 | DRG: 470 | Disposition: A | Discharge status: Skilled Nursing Facility | Payer: Medicare Other | Source: Ambulatory Visit | Attending: Orthopedic Surgery | Admitting: Orthopedic Surgery

## 2017-05-05 DIAGNOSIS — Z6835 Body mass index (BMI) 35.0-35.9, adult: Secondary | ICD-10-CM

## 2017-05-05 DIAGNOSIS — I10 Essential (primary) hypertension: Secondary | ICD-10-CM | POA: Diagnosis present

## 2017-05-05 DIAGNOSIS — Z79899 Other long term (current) drug therapy: Secondary | ICD-10-CM | POA: Diagnosis not present

## 2017-05-05 DIAGNOSIS — Z7984 Long term (current) use of oral hypoglycemic drugs: Secondary | ICD-10-CM

## 2017-05-05 DIAGNOSIS — Z419 Encounter for procedure for purposes other than remedying health state, unspecified: Secondary | ICD-10-CM

## 2017-05-05 DIAGNOSIS — M1611 Unilateral primary osteoarthritis, right hip: Principal | ICD-10-CM | POA: Diagnosis present

## 2017-05-05 DIAGNOSIS — Z7982 Long term (current) use of aspirin: Secondary | ICD-10-CM

## 2017-05-05 DIAGNOSIS — E669 Obesity, unspecified: Secondary | ICD-10-CM | POA: Diagnosis present

## 2017-05-05 DIAGNOSIS — Z8673 Personal history of transient ischemic attack (TIA), and cerebral infarction without residual deficits: Secondary | ICD-10-CM | POA: Diagnosis not present

## 2017-05-05 DIAGNOSIS — Z09 Encounter for follow-up examination after completed treatment for conditions other than malignant neoplasm: Secondary | ICD-10-CM

## 2017-05-05 DIAGNOSIS — Z87891 Personal history of nicotine dependence: Secondary | ICD-10-CM

## 2017-05-05 DIAGNOSIS — Z96642 Presence of left artificial hip joint: Secondary | ICD-10-CM | POA: Diagnosis present

## 2017-05-05 DIAGNOSIS — Z96649 Presence of unspecified artificial hip joint: Secondary | ICD-10-CM

## 2017-05-05 DIAGNOSIS — E119 Type 2 diabetes mellitus without complications: Secondary | ICD-10-CM | POA: Diagnosis present

## 2017-05-05 HISTORY — PX: TOTAL HIP ARTHROPLASTY: SHX124

## 2017-05-05 LAB — GLUCOSE, CAPILLARY
GLUCOSE-CAPILLARY: 168 mg/dL — AB (ref 65–99)
GLUCOSE-CAPILLARY: 134 mg/dL — AB (ref 65–99)
Glucose-Capillary: 117 mg/dL — ABNORMAL HIGH (ref 65–99)
Glucose-Capillary: 239 mg/dL — ABNORMAL HIGH (ref 65–99)

## 2017-05-05 LAB — ABO/RH: ABO/RH(D): O POS

## 2017-05-05 SURGERY — ARTHROPLASTY, HIP, TOTAL, ANTERIOR APPROACH
Anesthesia: Spinal | Laterality: Right

## 2017-05-05 MED ORDER — BUPIVACAINE HCL (PF) 0.25 % IJ SOLN
INTRAMUSCULAR | Status: AC
  Filled 2017-05-05: qty 30

## 2017-05-05 MED ORDER — PHENYLEPHRINE HCL 10 MG/ML IJ SOLN
INTRAMUSCULAR | Status: DC | PRN
Start: 2017-05-05 — End: 2017-05-05
  Administered 2017-05-05 (×2): 50 ug via INTRAVENOUS

## 2017-05-05 MED ORDER — GABAPENTIN 300 MG PO CAPS: 300.0000 mg | ORAL_CAPSULE | Freq: Once | ORAL | Status: DC

## 2017-05-05 MED ORDER — METOCLOPRAMIDE HCL 5 MG/ML IJ SOLN: 5.0000 mg | Freq: Three times a day (TID) | INTRAMUSCULAR | Status: DC | PRN

## 2017-05-05 MED ORDER — OXYCODONE HCL 5 MG PO TABS
5.0000 mg | ORAL_TABLET | ORAL | Status: DC | PRN
  Administered 2017-05-05: 5 mg via ORAL
  Administered 2017-05-05 – 2017-05-06 (×4): 10 mg via ORAL
  Filled 2017-05-05: qty 2
  Filled 2017-05-05: qty 1
  Filled 2017-05-05 (×3): qty 2

## 2017-05-05 MED ORDER — POLYETHYLENE GLYCOL 3350 17 G PO PACK
17.0000 g | PACK | Freq: Every day | ORAL | Status: DC | PRN
  Administered 2017-05-06: 17 g via ORAL
  Filled 2017-05-05: qty 1

## 2017-05-05 MED ORDER — BUPIVACAINE-EPINEPHRINE (PF) 0.25% -1:200000 IJ SOLN
INTRAMUSCULAR | Status: DC | PRN
  Administered 2017-05-05: 30 mL

## 2017-05-05 MED ORDER — MORPHINE SULFATE (PF) 2 MG/ML IV SOLN: 2.0000 mg | INTRAVENOUS | Status: DC | PRN

## 2017-05-05 MED ORDER — PROPOFOL 500 MG/50ML IV EMUL
INTRAVENOUS | Status: AC
Start: 2017-05-05 — End: ?
  Filled 2017-05-05: qty 50

## 2017-05-05 MED ORDER — LISINOPRIL 20 MG PO TABS
20.0000 mg | ORAL_TABLET | Freq: Every day | ORAL | Status: DC
  Administered 2017-05-07: 20 mg via ORAL
  Filled 2017-05-05 (×2): qty 1

## 2017-05-05 MED ORDER — DOCUSATE SODIUM 100 MG PO CAPS
100.0000 mg | ORAL_CAPSULE | Freq: Two times a day (BID) | ORAL | Status: DC
  Administered 2017-05-05 – 2017-05-07 (×4): 100 mg via ORAL
  Filled 2017-05-05 (×4): qty 1

## 2017-05-05 MED ORDER — PROPOFOL 500 MG/50ML IV EMUL
INTRAVENOUS | Status: DC | PRN
  Administered 2017-05-05: 75 ug/kg/min via INTRAVENOUS

## 2017-05-05 MED ORDER — FLEET ENEMA 7-19 GM/118ML RE ENEM: 1.0000 | ENEMA | Freq: Once | RECTAL | Status: DC | PRN

## 2017-05-05 MED ORDER — BACITRACIN 50000 UNITS IM SOLR
INTRAMUSCULAR | Status: AC
  Filled 2017-05-05: qty 1

## 2017-05-05 MED ORDER — ACETAMINOPHEN 500 MG PO TABS
1000.0000 mg | ORAL_TABLET | Freq: Once | ORAL | Status: AC
  Administered 2017-05-05: 1000 mg via ORAL

## 2017-05-05 MED ORDER — SODIUM CHLORIDE 0.9 % IV SOLN
INTRAVENOUS | Status: DC | PRN
  Administered 2017-05-05: 35 ug/min via INTRAVENOUS

## 2017-05-05 MED ORDER — OXYCODONE HCL 5 MG PO TABS: 5.0000 mg | ORAL_TABLET | Freq: Once | ORAL | Status: DC | PRN

## 2017-05-05 MED ORDER — CEFAZOLIN SODIUM-DEXTROSE 2-4 GM/100ML-% IV SOLN
2.0000 g | Freq: Four times a day (QID) | INTRAVENOUS | Status: AC
  Administered 2017-05-05 (×2): 2 g via INTRAVENOUS
  Filled 2017-05-05 (×2): qty 100

## 2017-05-05 MED ORDER — GLYCOPYRROLATE 0.2 MG/ML IJ SOLN
INTRAMUSCULAR | Status: DC | PRN
  Administered 2017-05-05: 0.2 mg via INTRAVENOUS

## 2017-05-05 MED ORDER — LISINOPRIL-HYDROCHLOROTHIAZIDE 20-12.5 MG PO TABS: 1.0000 | ORAL_TABLET | Freq: Every day | ORAL | Status: DC

## 2017-05-05 MED ORDER — CHLORHEXIDINE GLUCONATE 4 % EX LIQD
60.0000 mL | Freq: Once | CUTANEOUS | Status: AC
  Administered 2017-05-05: 4 via TOPICAL

## 2017-05-05 MED ORDER — ACETAMINOPHEN 325 MG PO TABS
650.0000 mg | ORAL_TABLET | Freq: Four times a day (QID) | ORAL | Status: DC | PRN
  Administered 2017-05-06 – 2017-05-07 (×2): 650 mg via ORAL
  Filled 2017-05-05 (×2): qty 2

## 2017-05-05 MED ORDER — BUPIVACAINE HCL (PF) 0.5 % IJ SOLN
INTRAMUSCULAR | Status: DC | PRN
  Administered 2017-05-05: 3 mL

## 2017-05-05 MED ORDER — SODIUM CHLORIDE 0.9 % IV SOLN
INTRAVENOUS | Status: DC
  Administered 2017-05-05: 06:00:00 via INTRAVENOUS

## 2017-05-05 MED ORDER — ATORVASTATIN CALCIUM 20 MG PO TABS
20.0000 mg | ORAL_TABLET | Freq: Every day | ORAL | Status: DC
  Administered 2017-05-06 – 2017-05-07 (×2): 20 mg via ORAL
  Filled 2017-05-05 (×2): qty 1

## 2017-05-05 MED ORDER — MIDAZOLAM HCL 5 MG/5ML IJ SOLN
INTRAMUSCULAR | Status: DC | PRN
  Administered 2017-05-05 (×2): 1 mg via INTRAVENOUS

## 2017-05-05 MED ORDER — ACETAMINOPHEN 500 MG PO TABS
1000.0000 mg | ORAL_TABLET | Freq: Four times a day (QID) | ORAL | Status: AC
  Administered 2017-05-05 – 2017-05-06 (×4): 1000 mg via ORAL
  Filled 2017-05-05 (×4): qty 2

## 2017-05-05 MED ORDER — PIOGLITAZONE HCL 45 MG PO TABS
45.0000 mg | ORAL_TABLET | Freq: Every day | ORAL | Status: DC
  Administered 2017-05-06 – 2017-05-07 (×2): 45 mg via ORAL
  Filled 2017-05-05 (×3): qty 1

## 2017-05-05 MED ORDER — LINAGLIPTIN 5 MG PO TABS
5.0000 mg | ORAL_TABLET | Freq: Every day | ORAL | Status: DC
  Administered 2017-05-06 – 2017-05-07 (×2): 5 mg via ORAL
  Filled 2017-05-05 (×3): qty 1

## 2017-05-05 MED ORDER — SENNA 8.6 MG PO TABS
1.0000 | ORAL_TABLET | Freq: Two times a day (BID) | ORAL | Status: DC
  Administered 2017-05-05 – 2017-05-07 (×4): 8.6 mg via ORAL
  Filled 2017-05-05 (×4): qty 1

## 2017-05-05 MED ORDER — FENTANYL CITRATE (PF) 100 MCG/2ML IJ SOLN
25.0000 ug | INTRAMUSCULAR | Status: DC | PRN
  Administered 2017-05-05 (×2): 50 ug via INTRAVENOUS

## 2017-05-05 MED ORDER — CEFAZOLIN SODIUM-DEXTROSE 2-4 GM/100ML-% IV SOLN
INTRAVENOUS | Status: AC
  Filled 2017-05-05: qty 100

## 2017-05-05 MED ORDER — METOCLOPRAMIDE HCL 10 MG PO TABS: 5.0000 mg | ORAL_TABLET | Freq: Three times a day (TID) | ORAL | Status: DC | PRN

## 2017-05-05 MED ORDER — FAMOTIDINE 20 MG PO TABS
20.0000 mg | ORAL_TABLET | Freq: Once | ORAL | Status: AC
  Administered 2017-05-05: 20 mg via ORAL

## 2017-05-05 MED ORDER — HYDROCHLOROTHIAZIDE 12.5 MG PO CAPS
12.5000 mg | ORAL_CAPSULE | Freq: Every day | ORAL | Status: DC
  Administered 2017-05-07: 12.5 mg via ORAL
  Filled 2017-05-05 (×2): qty 1

## 2017-05-05 MED ORDER — ALUM & MAG HYDROXIDE-SIMETH 200-200-20 MG/5ML PO SUSP: 30.0000 mL | ORAL | Status: DC | PRN

## 2017-05-05 MED ORDER — MIDAZOLAM HCL 2 MG/2ML IJ SOLN
INTRAMUSCULAR | Status: AC
  Filled 2017-05-05: qty 2

## 2017-05-05 MED ORDER — PHENOL 1.4 % MT LIQD
1.0000 | OROMUCOSAL | Status: DC | PRN
  Filled 2017-05-05: qty 177

## 2017-05-05 MED ORDER — ASPIRIN 325 MG PO TABS
325.0000 mg | ORAL_TABLET | Freq: Every day | ORAL | Status: DC
  Administered 2017-05-06: 325 mg via ORAL
  Filled 2017-05-05 (×2): qty 1

## 2017-05-05 MED ORDER — BUPIVACAINE HCL (PF) 0.5 % IJ SOLN: INTRAMUSCULAR | Status: DC | PRN

## 2017-05-05 MED ORDER — GLIPIZIDE 10 MG PO TABS
10.0000 mg | ORAL_TABLET | Freq: Two times a day (BID) | ORAL | Status: DC
Start: 2017-05-05 — End: 2017-05-07
  Administered 2017-05-06 – 2017-05-07 (×4): 10 mg via ORAL
  Filled 2017-05-05 (×5): qty 1

## 2017-05-05 MED ORDER — OXYCODONE HCL 5 MG/5ML PO SOLN
5.0000 mg | Freq: Once | ORAL | Status: DC | PRN
Start: 2017-05-05 — End: 2017-05-05

## 2017-05-05 MED ORDER — LACTATED RINGERS IV SOLN
INTRAVENOUS | Status: DC
  Administered 2017-05-05: 13:00:00 via INTRAVENOUS

## 2017-05-05 MED ORDER — BISACODYL 10 MG RE SUPP
10.0000 mg | Freq: Every day | RECTAL | Status: DC | PRN
  Administered 2017-05-07: 10 mg via RECTAL
  Filled 2017-05-05: qty 1

## 2017-05-05 MED ORDER — FENTANYL CITRATE (PF) 100 MCG/2ML IJ SOLN
INTRAMUSCULAR | Status: AC
  Filled 2017-05-05: qty 2

## 2017-05-05 MED ORDER — PROMETHAZINE HCL 25 MG/ML IJ SOLN: 6.2500 mg | INTRAMUSCULAR | Status: DC | PRN

## 2017-05-05 MED ORDER — FAMOTIDINE 20 MG PO TABS
ORAL_TABLET | ORAL | Status: AC
  Administered 2017-05-05: 20 mg via ORAL
  Filled 2017-05-05: qty 1

## 2017-05-05 MED ORDER — ACETAMINOPHEN 650 MG RE SUPP
650.0000 mg | Freq: Four times a day (QID) | RECTAL | Status: DC | PRN
  Filled 2017-05-05: qty 1

## 2017-05-05 MED ORDER — PROPOFOL 500 MG/50ML IV EMUL
INTRAVENOUS | Status: AC
  Filled 2017-05-05: qty 50

## 2017-05-05 MED ORDER — FENTANYL CITRATE (PF) 100 MCG/2ML IJ SOLN
INTRAMUSCULAR | Status: DC | PRN
  Administered 2017-05-05: 25 ug via INTRAVENOUS
  Administered 2017-05-05: 50 ug via INTRAVENOUS
  Administered 2017-05-05: 25 ug via INTRAVENOUS

## 2017-05-05 MED ORDER — FENTANYL CITRATE (PF) 100 MCG/2ML IJ SOLN
INTRAMUSCULAR | Status: AC
  Administered 2017-05-05: 50 ug via INTRAVENOUS
  Filled 2017-05-05: qty 2

## 2017-05-05 MED ORDER — ONDANSETRON HCL 4 MG PO TABS: 4.0000 mg | ORAL_TABLET | Freq: Four times a day (QID) | ORAL | Status: DC | PRN

## 2017-05-05 MED ORDER — BUPIVACAINE HCL (PF) 0.5 % IJ SOLN
INTRAMUSCULAR | Status: AC
  Filled 2017-05-05: qty 10

## 2017-05-05 MED ORDER — SODIUM CHLORIDE 0.9 % IR SOLN
Status: DC | PRN
  Administered 2017-05-05: 1000 mL

## 2017-05-05 MED ORDER — EPINEPHRINE PF 1 MG/ML IJ SOLN
INTRAMUSCULAR | Status: AC
  Filled 2017-05-05: qty 1

## 2017-05-05 MED ORDER — SODIUM CHLORIDE FLUSH 0.9 % IV SOLN
INTRAVENOUS | Status: AC
  Filled 2017-05-05: qty 10

## 2017-05-05 MED ORDER — ACARBOSE 50 MG PO TABS
100.0000 mg | ORAL_TABLET | Freq: Three times a day (TID) | ORAL | Status: DC
  Administered 2017-05-06 – 2017-05-07 (×5): 100 mg via ORAL
  Filled 2017-05-05: qty 1
  Filled 2017-05-05: qty 2
  Filled 2017-05-05: qty 1
  Filled 2017-05-05: qty 2
  Filled 2017-05-05: qty 1
  Filled 2017-05-05: qty 2
  Filled 2017-05-05 (×3): qty 1

## 2017-05-05 MED ORDER — MEPERIDINE HCL 50 MG/ML IJ SOLN
6.2500 mg | INTRAMUSCULAR | Status: DC | PRN
Start: 2017-05-05 — End: 2017-05-05

## 2017-05-05 MED ORDER — ONDANSETRON HCL 4 MG/2ML IJ SOLN
4.0000 mg | Freq: Four times a day (QID) | INTRAMUSCULAR | Status: DC | PRN
  Administered 2017-05-06: 4 mg via INTRAVENOUS
  Filled 2017-05-05: qty 2

## 2017-05-05 MED ORDER — ACETAMINOPHEN 500 MG PO TABS
ORAL_TABLET | ORAL | Status: AC
  Administered 2017-05-05: 1000 mg via ORAL
  Filled 2017-05-05: qty 2

## 2017-05-05 MED ORDER — MENTHOL 3 MG MT LOZG
1.0000 | LOZENGE | OROMUCOSAL | Status: DC | PRN
  Filled 2017-05-05: qty 9

## 2017-05-05 MED ORDER — LACTATED RINGERS IV SOLN
INTRAVENOUS | Status: DC | PRN
  Administered 2017-05-05: 10:00:00 via INTRAVENOUS

## 2017-05-05 MED ORDER — TRANEXAMIC ACID 1000 MG/10ML IV SOLN
INTRAVENOUS | Status: DC | PRN
  Administered 2017-05-05: 1000 mg via INTRAVENOUS

## 2017-05-05 MED ORDER — GABAPENTIN 300 MG PO CAPS
ORAL_CAPSULE | ORAL | Status: AC
  Filled 2017-05-05: qty 1

## 2017-05-05 SURGICAL SUPPLY — 52 items
BASIN GRAD PLASTIC 32OZ STRL (MISCELLANEOUS) ×2 IMPLANT
BLADE SAGITTAL WIDE XTHICK NO (BLADE) ×2 IMPLANT
BLADE SURG 10 STRL SS SAFETY (BLADE) ×2 IMPLANT
BNDG COHESIVE 4X5 TAN STRL (GAUZE/BANDAGES/DRESSINGS) ×4 IMPLANT
BRUSH SCRUB EZ  4% CHG (MISCELLANEOUS) ×2
BRUSH SCRUB EZ 4% CHG (MISCELLANEOUS) ×2 IMPLANT
CAPT HIP TOTAL 2 ×2 IMPLANT
CHLORAPREP W/TINT 26ML (MISCELLANEOUS) ×2 IMPLANT
COVER TABLE BACK 60X90 (DRAPES) ×2 IMPLANT
DRAPE C-ARM 42X72 X-RAY (DRAPES) ×2 IMPLANT
DRAPE POUCH INSTRU U-SHP 10X18 (DRAPES) ×2 IMPLANT
DRAPE SHEET LG 3/4 BI-LAMINATE (DRAPES) ×2 IMPLANT
DRAPE STERI IOBAN 125X83 (DRAPES) ×4 IMPLANT
DRAPE TABLE BACK 80X90 (DRAPES) ×2 IMPLANT
DRAPE U-SHAPE 47X51 STRL (DRAPES) ×2 IMPLANT
DRSG AQUACEL AG ADV 3.5X10 (GAUZE/BANDAGES/DRESSINGS) IMPLANT
DRSG AQUACEL AG ADV 3.5X14 (GAUZE/BANDAGES/DRESSINGS) ×2 IMPLANT
ELECT BLADE 6.5 EXT (BLADE) ×2 IMPLANT
ELECT REM PT RETURN 9FT ADLT (ELECTROSURGICAL) ×2
ELECTRODE REM PT RTRN 9FT ADLT (ELECTROSURGICAL) ×1 IMPLANT
GAUZE PETRO XEROFOAM 1X8 (MISCELLANEOUS) ×2 IMPLANT
GAUZE PETROLATUM 1 X8 (GAUZE/BANDAGES/DRESSINGS) ×2 IMPLANT
GLOVE INDICATOR 8.0 STRL GRN (GLOVE) ×2 IMPLANT
GLOVE SURG ORTHO 8.0 STRL STRW (GLOVE) ×2 IMPLANT
GOWN STRL REUS W/ TWL LRG LVL3 (GOWN DISPOSABLE) ×2 IMPLANT
GOWN STRL REUS W/ TWL XL LVL3 (GOWN DISPOSABLE) ×1 IMPLANT
GOWN STRL REUS W/TWL LRG LVL3 (GOWN DISPOSABLE) ×2
GOWN STRL REUS W/TWL XL LVL3 (GOWN DISPOSABLE) ×1
HOOD W/PEELAWAY (MISCELLANEOUS) ×6 IMPLANT
IV NS 1000ML (IV SOLUTION) ×1
IV NS 1000ML BAXH (IV SOLUTION) ×1 IMPLANT
KIT PATIENT CARE HANA TABLE (KITS) ×2 IMPLANT
KIT RM TURNOVER CYSTO AR (KITS) ×2 IMPLANT
MAT BLUE FLOOR 46X72 FLO (MISCELLANEOUS) ×2 IMPLANT
NDL SAFETY 18GX1.5 (NEEDLE) ×4 IMPLANT
NEEDLE HYPO 22GX1.5 SAFETY (NEEDLE) ×2 IMPLANT
NEEDLE SPNL 20GX3.5 QUINCKE YW (NEEDLE) ×4 IMPLANT
PACK HIP PROSTHESIS (MISCELLANEOUS) ×2 IMPLANT
PADDING CAST BLEND 4X4 NS (MISCELLANEOUS) ×4 IMPLANT
PILLOW ABDUCTION MEDIUM (MISCELLANEOUS) ×2 IMPLANT
PULSAVAC PLUS IRRIG FAN TIP (DISPOSABLE) ×2
SPONGE LAP 18X18 5 PK (GAUZE/BANDAGES/DRESSINGS) ×4 IMPLANT
STAPLER PROXIMATE SKIN (STAPLE) ×2 IMPLANT
STAPLER SKIN PROX 35W (STAPLE) ×2 IMPLANT
SUT BONE WAX W31G (SUTURE) ×2 IMPLANT
SUT DVC 2 QUILL PDO  T11 36X36 (SUTURE) ×1
SUT DVC 2 QUILL PDO T11 36X36 (SUTURE) ×1 IMPLANT
SUT VIC AB 2-0 CT1 18 (SUTURE) ×2 IMPLANT
SYR 20CC LL (SYRINGE) ×2 IMPLANT
TIP FAN IRRIG PULSAVAC PLUS (DISPOSABLE) ×1 IMPLANT
TIP IRRIG/SUCT HIGH CAPACITY (MISCELLANEOUS) ×2 IMPLANT
TOWEL OR 17X26 4PK STRL BLUE (TOWEL DISPOSABLE) ×4 IMPLANT

## 2017-05-05 NOTE — Transfer of Care (Signed)
Immediate Anesthesia Transfer of Care Note  Patient: [name] [name]  Procedure(s) Performed: Procedure(s): TOTAL HIP ARTHROPLASTY ANTERIOR APPROACH (Right)  Patient Location: PACU  Anesthesia Type:General  Level of Consciousness: awake, alert  and oriented  Airway & Oxygen Therapy: Patient Spontanous Breathing and Patient connected to face mask oxygen  Post-op Assessment: Report given to RN and Post -op Vital signs reviewed and stable  Post vital signs: Reviewed and stable  Last Vitals:  Vitals:   05/05/17 0601  BP: (!) 141/71  Pulse: 92  Resp: 16  Temp: 36.5 C  SpO2: 96%    Last Pain:  Vitals:   05/05/17 0601  TempSrc: Tympanic  PainSc: 8          Complications: No apparent anesthesia complications

## 2017-05-05 NOTE — Progress Notes (Signed)
Pt notified that we do not have Precose. Pt notified to bring medication from home. Pts husband will bring it tomm.

## 2017-05-05 NOTE — H&P (Signed)
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 Symptoms are rated as moderate to severe, and have been worsening.  This is significantly impairing activities of daily living.  She has elected for surgical management.   Past Medical History:  Diagnosis Date  . Arthritis   . Diabetes mellitus without complication ([place])   . Hypertension   . Stroke [facility])    "light stoke" No residual deficits   Past Surgical History:  Procedure Laterality Date  . CHOLECYSTECTOMY    . HIP SURGERY    . JOINT REPLACEMENT     left hip   Social History   Social History  . Marital status: Married    Spouse name: N/A  . Number of children: N/A  . Years of education: N/A   Social History Main Topics  . Smoking status: Former Smoker    Quit date: 04/28/2012  . Smokeless tobacco: Never Used  . Alcohol use No  . Drug use: No  . Sexual activity: Not Asked   Other Topics Concern  . None   Social History Narrative  . None   History reviewed. No pertinent family history. Allergies  Allergen Reactions  . Gabapentin     Kidney doctor told me not to take  . Nsaids     Kidney doctor told me not to take   Prior to Admission medications   Medication Sig Start Date End Date Taking? Authorizing Provider  acarbose (PRECOSE) 100 MG tablet Take 100 mg by mouth 3 (three) times daily with meals.    Yes [provider]  aspirin 325 MG tablet Take 325 mg by mouth daily.    Yes [provider]  atorvastatin (LIPITOR) 20 MG tablet Take 20 mg by mouth daily.    Yes [provider]  glipiZIDE (GLUCOTROL) 10 MG tablet Take 10 mg by mouth 2 (two) times daily before a meal.  06/14/15  Yes [provider]  lisinopril-hydrochlorothiazide (PRINZIDE,ZESTORETIC) 20-12.5 MG tablet Take 1  tablet by mouth daily.   Yes [provider]  pioglitazone (ACTOS) 45 MG tablet Take 45 mg by mouth daily.  06/14/15  Yes [provider]  sitaGLIPtin (JANUVIA) 100 MG tablet Take 100 mg by mouth daily.    Yes [provider]     Positive ROS: All other systems have been reviewed and were otherwise negative with the exception of those mentioned in the HPI and as above.  Physical Exam: General: Alert, no acute distress Cardiovascular: Regular rate and rhythm, no murmurs rubs or gallops.  No pedal edema Respiratory: Clear to auscultation bilaterally, no wheezes rales or rhonchi. No cyanosis, no use of accessory musculature GI: No organomegaly, abdomen is soft and non-tender nondistended with positive bowel sounds. Skin: Skin intact, no lesions within the operative field. 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 The risks include but are not limited to infection, bleeding requiring  blood transfusion, nerve or blood vessel injury, joint stiffness or loss of motion, persistent pain, weakness or instability, malunion, nonunion and hardware failure and the need for further surgery. Medical risks include but are not limited to DVT and pulmonary embolism, myocardial infarction, stroke, pneumonia, respiratory failure and death. Patient understood these risks and wished to proceed.   Lovell Sheehan, MD   05/05/2017 7:39 AM

## 2017-05-05 NOTE — Anesthesia Post-op Follow-up Note (Signed)
Anesthesia QCDR form completed.        

## 2017-05-05 NOTE — Progress Notes (Signed)
15 minute report called to floor.

## 2017-05-05 NOTE — Evaluation (Signed)
Physical Therapy Evaluation Patient Details Name: [name] [name] MRN: [id number] DOB: 1946-01-10 Today's Date: 05/05/2017   History of Present Illness  Pt s/p R anterior THA 05/05/2017. PMH: stroke, HTN, DM, L THA.  Clinical Impression  Prior to admission pt reports independence with household ambulation and ADLs; pt reports use of SPC for long distance public ambulation. Pt reports family available to assist 24 hour/day upon discharge. Pt reports 10/10 R hip pain beginning of session; pt reports decrease to 2/10 R hip pain during activity; 5/10 R hip pain end of session in bedside chair (nursing notified). Pt A and O x4 throughout session. Pt currently requires +1 min to mod assist for bed mobility; pt requires +1 min assist with RW for transfers and short ambulation to bedside chair. Pt will continue to benefit from skilled PT to address balance, strength, pain, and functional mobility deficits. Recommended discharge to SNF.    Follow Up Recommendations SNF    Equipment Recommendations  Rolling walker with 5" wheels    Recommendations for Other Services       Precautions / Restrictions Precautions Precautions: Anterior Hip;Fall Precaution Booklet Issued: Yes (comment) Restrictions Weight Bearing Restrictions: Yes RLE Weight Bearing: Weight bearing as tolerated      Mobility  Bed Mobility Overal bed mobility: Needs Assistance Bed Mobility: Supine to Sit     Supine to sit: Min assist;Mod assist;HOB elevated     General bed mobility comments: pt requires +1 mod assist from therapist for R LE and min to mod assist for trunk  Transfers Overall transfer level: Needs assistance Equipment used: Rolling walker (2 wheeled) Transfers: Sit to/from Stand Sit to Stand: Min assist         General transfer comment: pt requires +1 min assist for initial stand; vc's for B LE and B UE placement for use of RW  Ambulation/Gait Ambulation/Gait assistance: Min assist Ambulation Distance  (Feet): 3 Feet Assistive device: Rolling walker (2 wheeled)   Gait velocity: decreased   General Gait Details: pt required +1 min assist and RW for short ambulation to bedside chair with signiciantly increased time/effort noted  Stairs            Wheelchair Mobility    Modified Rankin (Stroke Patients Only)       Balance Overall balance assessment: Needs assistance Sitting-balance support: Bilateral upper extremity supported;Feet supported Sitting balance-Leahy Scale: Poor Sitting balance - Comments: pt able to maintain static sitting balance with B UE and B feet supported   Standing balance support: Bilateral upper extremity supported Standing balance-Leahy Scale: Poor Standing balance comment: pt able to maintain static standing and dynamic standing balance with RW and CGA no overt loss of balance noted                             Pertinent Vitals/Pain Pain Assessment: 0-10 Pain Score: 10-Worst pain ever Pain Location: pt reports 10/10 R hip pain beginning of session; pt reports decrease to 2/10 R hip pain during activity; 5/10 R hip pain end of session in bedside chair Pain Descriptors / Indicators: Aching;Moaning;Operative site guarding;Sore;Tender Pain Intervention(s): Limited activity within patient's tolerance;Monitored during session;Repositioned;Ice applied    Home Living Family/patient expects to be discharged to:: Private residence Living Arrangements: Spouse/significant other Available Help at Discharge: Family;Available 24 hours/day Type of Home: House Home Access: Stairs to enter Entrance Stairs-Rails: Can reach both Entrance Stairs-Number of Steps: 4 Home Layout: One level (2 steps down  to family room) Home Equipment: Gilford Rile - 2 wheels;Cane - single point;Bedside commode      Prior Function Level of Independence: Independent with assistive device(s)         Comments: pt reports independence with ambulation and ADLs; pt reports use of  SPC for public/long distance ambulation     Hand Dominance        Extremity/Trunk Assessment   Upper Extremity Assessment Upper Extremity Assessment: Overall WFL for tasks assessed    Lower Extremity Assessment Lower Extremity Assessment: RLE deficits/detail (R LE sensation assessed and no impairments noted) RLE Deficits / Details: grossly at least 3/5 for R ankle DF, R knee flexion; R knee extension 2+/5, R hip abduction/flexion 2/5 (likely limited due to pain)       Communication   Communication: No difficulties  Cognition Arousal/Alertness: Awake/alert Behavior During Therapy: WFL for tasks assessed/performed Overall Cognitive Status: Within Functional Limits for tasks assessed                                        General Comments      Exercises Total Joint Exercises Ankle Circles/Pumps: AROM;Both;10 reps;Supine Quad Sets: AROM;Strengthening;Both;10 reps;Supine Gluteal Sets: AROM;Strengthening;Both;10 reps;Supine Towel Squeeze: AROM;Strengthening;Both;10 reps;Supine Short Arc Quad: AAROM;Strengthening;Right;10 reps;Supine Heel Slides: AAROM;Strengthening;Right;10 reps;Supine   Assessment/Plan    PT Assessment Patient needs continued PT services  PT Problem List Decreased strength;Decreased range of motion;Decreased activity tolerance;Decreased balance;Decreased mobility;Decreased knowledge of use of DME;Decreased knowledge of precautions;Pain       PT Treatment Interventions DME instruction;Gait training;Stair training;Functional mobility training;Therapeutic activities;Therapeutic exercise;Balance training;Patient/family education    PT Goals (Current goals can be found in the Care Plan section)  Acute Rehab PT Goals Patient Stated Goal: to get stronger and return home PT Goal Formulation: With patient Time For Goal Achievement: 05/19/17 Potential to Achieve Goals: Good    Frequency BID   Barriers to discharge        Co-evaluation                AM-PAC PT "6 Clicks" Daily Activity  Outcome Measure Difficulty turning over in bed (including adjusting bedclothes, sheets and blankets)?: A Little Difficulty moving from lying on back to sitting on the side of the bed? : Total Difficulty sitting down on and standing up from a chair with arms (e.g., wheelchair, bedside commode, etc,.)?: Total Help needed moving to and from a bed to chair (including a wheelchair)?: A Little Help needed walking in hospital room?: A Lot Help needed climbing 3-5 steps with a railing? : Total 6 Click Score: 11    End of Session Equipment Utilized During Treatment: Gait belt Activity Tolerance: Patient limited by pain Patient left: in chair;with call bell/phone within reach;with chair alarm set;with nursing/sitter in room;with SCD's reapplied (B heels elevated via towel rolls; hip abduction pillow in place; SCDs in place and activated; ice pack applied to R hip) Nurse Communication: Mobility status;Weight bearing status PT Visit Diagnosis: Unsteadiness on feet (R26.81);Other abnormalities of gait and mobility (R26.89);Muscle weakness (generalized) (M62.81);Pain Pain - Right/Left: Right Pain - part of body: Hip    Time: 9030-0923 PT Time Calculation (min) (ACUTE ONLY): 38 min   Charges:         PT G CodesLaqueta Carina, SPT May 10, 2017,5:02 PM [phone]

## 2017-05-05 NOTE — Anesthesia Preprocedure Evaluation (Signed)
Anesthesia Evaluation  Patient identified by MRN, date of birth, ID band Patient awake    Reviewed: Allergy & Precautions, NPO status , Patient's Chart, lab work & pertinent test results  History of Anesthesia Complications Negative for: history of anesthetic complications  Airway Mallampati: II  TM Distance: >3 FB Neck ROM: Full    Dental  (+) Edentulous Upper, Edentulous Lower   Pulmonary neg sleep apnea, neg COPD, former smoker,    breath sounds clear to auscultation- rhonchi (-) wheezing      Cardiovascular hypertension, Pt. on medications (-) CAD, (-) Past MI and (-) Cardiac Stents  Rhythm:Regular Rate:Normal - Systolic murmurs and - Diastolic murmurs    Neuro/Psych CVA, No Residual Symptoms negative psych ROS   GI/Hepatic negative GI ROS, Neg liver ROS,   Endo/Other  diabetes, Oral Hypoglycemic Agents  Renal/GU negative Renal ROS     Musculoskeletal  (+) Arthritis ,   Abdominal (+) + obese,   Peds  Hematology negative hematology ROS (+)   Anesthesia Other Findings Past Medical History: No date: Arthritis No date: Diabetes mellitus without complication (HCC) No date: Hypertension No date: Stroke [facility])     Comment:  "light stoke" No residual deficits   Reproductive/Obstetrics                             Anesthesia Physical Anesthesia Plan  ASA: III  Anesthesia Plan: Spinal   Post-op Pain Management:    Induction:   PONV Risk Score and Plan: 2 and Ondansetron and Propofol infusion  Airway Management Planned: Natural Airway  Additional Equipment:   Intra-op Plan:   Post-operative Plan:   Informed Consent: I have reviewed the patients History and Physical, chart, labs and discussed the procedure including the risks, benefits and alternatives for the proposed anesthesia with the patient or authorized representative who has indicated his/her understanding and acceptance.    Dental advisory given  Plan Discussed with: Anesthesiologist and CRNA  Anesthesia Plan Comments:         Lab Results  Component Value Date   WBC 6.1 11/08/2016   HGB 11.5 (L) 11/08/2016   HCT 34.0 (L) 11/08/2016   MCV 89.0 11/08/2016   PLT 256 11/08/2016    Anesthesia Quick Evaluation

## 2017-05-05 NOTE — Anesthesia Procedure Notes (Addendum)
Spinal  Patient location during procedure: OR Start time: 05/05/2017 7:50 AM End time: 05/05/2017 7:57 AM Staffing Anesthesiologist: Randa Lynn, AMY Resident/CRNA: Demetrius Charity Performed: resident/CRNA  Preanesthetic Checklist Completed: patient identified, site marked, surgical consent, pre-op evaluation, timeout performed, IV checked, risks and benefits discussed and monitors and equipment checked Spinal Block Patient position: sitting Prep: ChloraPrep Patient monitoring: heart rate, continuous pulse ox, blood pressure and cardiac monitor Approach: midline Location: L4-5 Injection technique: single-shot Needle Needle type: Introducer and Whitacre  Needle gauge: 25 G Needle length: 9 cm Additional Notes Negative paresthesia. Negative blood return. Positive free-flowing CSF. Expiration date of kit checked and confirmed. Patient tolerated procedure well, without complications.

## 2017-05-05 NOTE — NC FL2 (Signed)
[place] LEVEL OF CARE SCREENING TOOL     IDENTIFICATION  Patient Name: [name] [name] Birthdate: [birth year] Sex: female Admission Date (Current Location): 05/05/2017  [place] and [place] Number:  Engineering geologist and Address:  [facility], [address] [place], [place] [address]      Provider Number: [id number]  Attending Physician Name and Address:  Lovell Sheehan, MD  Relative Name and Phone Number:       Current Level of Care: Hospital Recommended Level of Care: [place] Prior Approval Number:    Date Approved/Denied:   PASRR Number:  ([id number] )  Discharge Plan: SNF    Current Diagnoses: Patient Active Problem List   Diagnosis Date Noted  . Status post THR (total hip replacement) 05/05/2017  . MGUS (monoclonal gammopathy of unknown significance) 11/22/2016  . Diabetes mellitus type 2, uncomplicated ([place]) 78/46/9629    Orientation RESPIRATION BLADDER Height & Weight     Self, Time, Situation, Place  Normal Continent Weight: 209 lb (94.8 kg) Height:  5\' 4"  (162.6 cm)  BEHAVIORAL SYMPTOMS/MOOD NEUROLOGICAL BOWEL NUTRITION STATUS   (none)  (none) Continent Diet (Diet: Full Liquid to be Advanced. )  AMBULATORY STATUS COMMUNICATION OF NEEDS Skin   Extensive Assist Verbally Surgical wounds (Incision: Right Hip. )                       Personal Care Assistance Level of Assistance  Bathing, Feeding, Dressing Bathing Assistance: Limited assistance Feeding assistance: Independent Dressing Assistance: Limited assistance     Functional Limitations Info  Sight, Hearing, Speech Sight Info: Adequate Hearing Info: Adequate Speech Info: Adequate    SPECIAL CARE FACTORS FREQUENCY  PT (By licensed PT), OT (By licensed OT)     PT Frequency:  (5) OT Frequency:  (5)            Contractures      Additional Factors Info  Code Status, Allergies Code Status Info:  (Full Code. ) Allergies  Info:  (Gabapentin, Nsaids)           Current Medications (05/05/2017):  This is the current hospital active medication list Current Facility-Administered Medications  Medication Dose Route Frequency Provider Last Rate Last Dose  . acarbose (PRECOSE) tablet 100 mg  100 mg Oral TID WC Lovell Sheehan, MD      . acetaminophen (TYLENOL) tablet 650 mg  650 mg Oral Q6H PRN Lovell Sheehan, MD       Or  . acetaminophen (TYLENOL) suppository 650 mg  650 mg Rectal Q6H PRN Lovell Sheehan, MD      . acetaminophen (TYLENOL) tablet 1,000 mg  1,000 mg Oral Q6H Lovell Sheehan, MD   1,000 mg at 05/05/17 1356  . alum & mag hydroxide-simeth (MAALOX/MYLANTA) 200-200-20 MG/5ML suspension 30 mL  30 mL Oral Q4H PRN Lovell Sheehan, MD      . aspirin tablet 325 mg  325 mg Oral Daily Lovell Sheehan, MD      . atorvastatin (LIPITOR) tablet 20 mg  20 mg Oral Daily Lovell Sheehan, MD      . bisacodyl (DULCOLAX) suppository 10 mg  10 mg Rectal Daily PRN Lovell Sheehan, MD      . ceFAZolin (ANCEF) IVPB 2g/100 mL premix  2 g Intravenous Q6H Lovell Sheehan, MD      . docusate sodium (COLACE) capsule 100 mg  100 mg Oral BID Kurtis Bushman  R, MD      . glipiZIDE (GLUCOTROL) tablet 10 mg  10 mg Oral BID AC Lovell Sheehan, MD      . lisinopril (PRINIVIL,ZESTRIL) tablet 20 mg  20 mg Oral Daily Lovell Sheehan, MD       And  . hydrochlorothiazide (MICROZIDE) capsule 12.5 mg  12.5 mg Oral Daily Lovell Sheehan, MD      . lactated ringers infusion   Intravenous Continuous Lovell Sheehan, MD 50 mL/hr at 05/05/17 1312    . linagliptin (TRADJENTA) tablet 5 mg  5 mg Oral Daily Lovell Sheehan, MD      . menthol-cetylpyridinium (CEPACOL) lozenge 3 mg  1 lozenge Oral PRN Lovell Sheehan, MD       Or  . phenol (CHLORASEPTIC) mouth spray 1 spray  1 spray Mouth/Throat PRN Lovell Sheehan, MD      . metoCLOPramide (REGLAN) tablet 5-10 mg  5-10 mg Oral Q8H PRN Lovell Sheehan, MD       Or  . metoCLOPramide (REGLAN) injection  5-10 mg  5-10 mg Intravenous Q8H PRN Lovell Sheehan, MD      . morphine 2 MG/ML injection 2 mg  2 mg Intravenous Q2H PRN Lovell Sheehan, MD      . ondansetron [facility]) tablet 4 mg  4 mg Oral Q6H PRN Lovell Sheehan, MD       Or  . ondansetron [facility]) injection 4 mg  4 mg Intravenous Q6H PRN Lovell Sheehan, MD      . oxyCODONE (Oxy IR/ROXICODONE) immediate release tablet 5-10 mg  5-10 mg Oral Q3H PRN Lovell Sheehan, MD   5 mg at 05/05/17 1311  . pioglitazone (ACTOS) tablet 45 mg  45 mg Oral Daily Lovell Sheehan, MD      . polyethylene glycol (MIRALAX / GLYCOLAX) packet 17 g  17 g Oral Daily PRN Lovell Sheehan, MD      . senna (SENOKOT) tablet 8.6 mg  1 tablet Oral BID Lovell Sheehan, MD      . sodium phosphate (FLEET) 7-19 GM/118ML enema 1 enema  1 enema Rectal Once PRN Lovell Sheehan, MD         Discharge Medications: Please see discharge summary for a list of discharge medications.  Relevant Imaging Results:  Relevant Lab Results:   Additional Information  (SSN: [id number])  Marquize Seib, Veronia Beets, LCSW

## 2017-05-05 NOTE — Progress Notes (Signed)
Pt arrived to room 146 from PACU. Pt alert and oriented, surgical dressing clean dry and intact, skin assessment completed with Roselyn Reef, RN. Navigator completed. Pt oriented to room and call bell. Family at bedside. Bed in lowest position call bell in reach and bed alarm on.

## 2017-05-05 NOTE — Anesthesia Procedure Notes (Signed)
Performed by: Jaydyn Menon Pre-anesthesia Checklist: Patient identified, Emergency Drugs available, Suction available, Patient being monitored and Timeout performed Oxygen Delivery Method: Simple face mask       

## 2017-05-05 NOTE — Op Note (Signed)
05/05/2017  10:30 AM  PATIENT:  [name] [name]   MRN: [id number]  PRE-OPERATIVE DIAGNOSIS:  Osteoarthritis right hip   POST-OPERATIVE DIAGNOSIS: Same  Procedure: Right Total Hip Replacement  Surgeon: Elyn Aquas. Harlow Mares, MD   Anesthesia: Spinal   EBL: 300 mL   Specimens: None   Drains: None   Components used: A size 2 lateral Polarstem [employer]hew, R3 size 50 mm shell, and a 32 mm head    Description of the procedure in detail: After informed consent was obtained and the appropriate extremity marked in the pre-operative holding area, the patient was taken to the operating room and placed in the supine position on the fracture table. All pressure points were well padded and bilateral lower extremities were place in traction spars. The hip was prepped and draped in standard sterile fashion. A spinal anesthetic had been delivered by the anesthesia team. The skin and subcutaneous tissues were injected with a mixture of Marcaine with epinephrine for post-operative pain. A longitudinal incision approximately 10 cm in length was carried out from the anterior superior iliac spine to the greater trochanter. The tensor fascia was divided and blunt dissection was taken down to the level of the joint capsule. The lateral circumflex vessels were cauterized. Deep retractors were placed and a portion of the anterior capsule was excised. Using fluoroscopy the neck cut was planned and carried out with a sagittal saw. The head was passed from the field with use of a corkscrew and hip skid. Deep retractors were placed along the acetabulum and the degenerative labrum and large osteophytes were removed with a Rongeur. The cup was sequentially reamed to a size 50 mm. The wound was irrigated and using fluoroscopy the size 50 mm cup was impacted in to anatomic position. A single screw was placed followed by a threaded hole cover. The final liner was impacted in to position. Attention was then turned to the  proximal femur. The leg was placed in extension and external rotation. The canal was opened and sequentially broached to a size 2. The trial components were placed and the hip relocated. The components were found to be in good position using fluoroscopy. The hip was dislocated and the trial components removed. The final components were impacted in to position and the hip relocated. The final components were again check with fluoroscopy and found to be in good position. Hemostasis was achieved with electrocautery. The deep capsule was injected with Marcaine and epinephrine. The wound was irrigated with bacitracin laced normal saline and the tensor fascia closed with #2 Quill suture. The subcutaneous tissues were closed with 2-0 vicryl and staples for the skin. A sterile dressing was applied and an abduction pillow. Patient tolerated the procedure well and there were no apparent complication. Patient was taken to the recovery room in good condition.   Kurtis Bushman, MD

## 2017-05-05 NOTE — Progress Notes (Signed)
Dr. Kayleen Memos aware of 193 ml in bladder, patient has Voiced twice, no further orders, do not need to In and out cath patient.

## 2017-05-06 LAB — GLUCOSE, CAPILLARY
GLUCOSE-CAPILLARY: 186 mg/dL — AB (ref 65–99)
GLUCOSE-CAPILLARY: 132 mg/dL — AB (ref 65–99)
Glucose-Capillary: 178 mg/dL — ABNORMAL HIGH (ref 65–99)
Glucose-Capillary: 141 mg/dL — ABNORMAL HIGH (ref 65–99)

## 2017-05-06 LAB — BASIC METABOLIC PANEL
Anion gap: 5 (ref 5–15)
BUN: 21 mg/dL — AB (ref 6–20)
CHLORIDE: 105 mmol/L (ref 101–111)
CO2: 22 mmol/L (ref 22–32)
CREATININE: 1.14 mg/dL — AB (ref 0.44–1.00)
Calcium: 8.3 mg/dL — ABNORMAL LOW (ref 8.9–10.3)
GFR calc Af Amer: 55 mL/min — ABNORMAL LOW (ref >60)
GFR, EST NON AFRICAN AMERICAN: 48 mL/min — AB (ref >60)
Glucose, Bld: 232 mg/dL — ABNORMAL HIGH (ref 65–99)
Potassium: 4.2 mmol/L (ref 3.5–5.1)
SODIUM: 132 mmol/L — AB (ref 135–145)

## 2017-05-06 LAB — CBC
HCT: 28.5 % — ABNORMAL LOW (ref 35.0–47.0)
HEMOGLOBIN: 9.8 g/dL — AB (ref 12.0–16.0)
MCH: 31 pg (ref 26.0–34.0)
MCHC: 34.4 g/dL (ref 32.0–36.0)
MCV: 89.9 fL (ref 80.0–100.0)
Platelets: 179 10*3/uL (ref 150–440)
RBC: 3.17 MIL/uL — AB (ref 3.80–5.20)
RDW: 14.4 % (ref 11.5–14.5)
WBC: 9.9 10*3/uL (ref 3.6–11.0)

## 2017-05-06 MED ORDER — ASPIRIN 325 MG PO TABS
325.0000 mg | ORAL_TABLET | Freq: Two times a day (BID) | ORAL | Status: DC
  Administered 2017-05-06 – 2017-05-07 (×2): 325 mg via ORAL
  Filled 2017-05-06 (×3): qty 1

## 2017-05-06 MED ORDER — OXYCODONE HCL 5 MG PO TABS
5.0000 mg | ORAL_TABLET | ORAL | Status: DC | PRN
  Administered 2017-05-06 – 2017-05-07 (×3): 10 mg via ORAL
  Filled 2017-05-06 (×3): qty 2

## 2017-05-06 MED ORDER — INSULIN ASPART 100 UNIT/ML ~~LOC~~ SOLN
0.0000 [IU] | Freq: Three times a day (TID) | SUBCUTANEOUS | Status: DC
  Administered 2017-05-06 (×2): 2 [IU] via SUBCUTANEOUS
  Administered 2017-05-07: 3 [IU] via SUBCUTANEOUS
  Administered 2017-05-07: 2 [IU] via SUBCUTANEOUS
  Administered 2017-05-07: 1 [IU] via SUBCUTANEOUS
  Filled 2017-05-06 (×5): qty 1

## 2017-05-06 NOTE — Progress Notes (Signed)
Subjective:  Patient reports pain as moderate.  No other complaints.  Objective:   VITALS:   Vitals:   05/05/17 1640 05/05/17 1651 05/05/17 2330 05/06/17 0758  BP: 92/63 (!) 127/59 (!) 115/58 (!) 123/48  Pulse: 94 92 99 79  Resp: 20  18 14   Temp: 98.4 F (36.9 C)  100 F (37.8 C) 98.4 F (36.9 C)  TempSrc: Oral  Oral Oral  SpO2: 98% 98% 98% 99%  Weight:      Height:        PHYSICAL EXAM:  Sensation intact distally Dorsiflexion/Plantar flexion intact Incision: dressing C/D/I  LABS  Results for orders placed or performed during the hospital encounter of 05/05/17 (from the past 24 hour(s))  Glucose, capillary     Status: Abnormal   Collection Time: 05/05/17 10:54 AM  Result Value Ref Range   Glucose-Capillary 134 (H) 65 - 99 mg/dL  Glucose, capillary     Status: Abnormal   Collection Time: 05/05/17 12:40 PM  Result Value Ref Range   Glucose-Capillary 117 (H) 65 - 99 mg/dL  Glucose, capillary     Status: Abnormal   Collection Time: 05/05/17  8:44 PM  Result Value Ref Range   Glucose-Capillary 239 (H) 65 - 99 mg/dL  CBC     Status: Abnormal   Collection Time: 05/06/17  4:37 AM  Result Value Ref Range   WBC 9.9 3.6 - 11.0 K/uL   RBC 3.17 (L) 3.80 - 5.20 MIL/uL   Hemoglobin 9.8 (L) 12.0 - 16.0 g/dL   HCT 28.5 (L) 35.0 - 47.0 %   MCV 89.9 80.0 - 100.0 fL   MCH 31.0 26.0 - 34.0 pg   MCHC 34.4 32.0 - 36.0 g/dL   RDW 14.4 11.5 - 14.5 %   Platelets 179 150 - 440 K/uL  Basic metabolic panel     Status: Abnormal   Collection Time: 05/06/17  4:37 AM  Result Value Ref Range   Sodium 132 (L) 135 - 145 mmol/L   Potassium 4.2 3.5 - 5.1 mmol/L   Chloride 105 101 - 111 mmol/L   CO2 22 22 - 32 mmol/L   Glucose, Bld 232 (H) 65 - 99 mg/dL   BUN 21 (H) 6 - 20 mg/dL   Creatinine, Ser 1.14 (H) 0.44 - 1.00 mg/dL   Calcium 8.3 (L) 8.9 - 10.3 mg/dL   GFR calc non Af Amer 48 (L) >60 mL/min   GFR calc Af Amer 55 (L) >60 mL/min   Anion gap 5 5 - 15  Glucose, capillary      Status: Abnormal   Collection Time: 05/06/17  7:29 AM  Result Value Ref Range   Glucose-Capillary 186 (H) 65 - 99 mg/dL   Comment 1 Notify RN    Comment 2 Document in Chart     Dg Pelvis Portable  Result Date: 05/05/2017 CLINICAL DATA:  Status post right anterior approach hip arthroplasty. EXAM: PORTABLE PELVIS 1-2 VIEWS COMPARISON:  Intraoperative fluoro spot radiograph of earlier today. FINDINGS: The patient has undergone right total hip joint prosthesis placement. Radiographic positioning of the prosthetic components is good. The interface with the native bone appears normal. There is a pre-existing left hip joint prosthesis. IMPRESSION: No immediate complication following anterior approach right hip joint prosthesis placement. Electronically Signed   By: David  Martinique M.D.   On: 05/05/2017 11:26   Dg Hip Operative Unilat W Or W/o Pelvis Right  Result Date: 05/05/2017 CLINICAL DATA:  Intraoperative fluoro spot views for right  anterior approach hip arthroplasty. Reported fluoro time 24 seconds EXAM: OPERATIVE right HIP (WITH PELVIS IF PERFORMED) 2 VIEWS TECHNIQUE: Fluoroscopic spot image(s) were submitted for interpretation post-operatively. COMPARISON:  AP view of the pelvis from a bone survey dated November 14, 2016 FINDINGS: The patient has undergone placement of a total right hip joint prosthesis. The femoral component of the prosthesis appears appropriately position with normal appearing interface with the native bone. The acetabular component is not completely included in the field of view. IMPRESSION: Two intraoperative fluoro spot images revealing placement of a hip joint prosthesis. No immediate complication is observed. Electronically Signed   By: David  Martinique M.D.   On: 05/05/2017 10:22    Assessment/Plan: 1 Day Post-Op   Active Problems:   Status post THR (total hip replacement)   Advance diet Up with therapy D/C IV fluids  Discharge planning, SNF versus home with home  health, will monitor progress today with PT SSI ordered  Lovell Sheehan , MD 05/06/2017, 8:06 AM

## 2017-05-06 NOTE — Anesthesia Postprocedure Evaluation (Signed)
Anesthesia Post Note  Patient: Saryna C Kuhl  Procedure(s) Performed: Procedure(s) (LRB): TOTAL HIP ARTHROPLASTY ANTERIOR APPROACH (Right)  Patient location during evaluation: Other Anesthesia Type: Spinal Level of consciousness: oriented and awake and alert Pain management: pain level controlled Vital Signs Assessment: post-procedure vital signs reviewed and stable Respiratory status: spontaneous breathing, respiratory function stable and patient connected to nasal cannula oxygen Cardiovascular status: blood pressure returned to baseline and stable Postop Assessment: no headache and no backache Anesthetic complications: no     Last Vitals:  Vitals:   05/05/17 2330 05/06/17 0758  BP: (!) 115/58 (!) 123/48  Pulse: 99 79  Resp: 18 14  Temp: 37.8 C 36.9 C  SpO2: 98% 99%    Last Pain:  Vitals:   05/06/17 0808  TempSrc:   PainSc: 6                  Alison Stalling

## 2017-05-06 NOTE — Progress Notes (Signed)
Physical Therapy Treatment Patient Details Name: [name] [name] MRN: [id number] DOB: 10-08-1945 Today's Date: 05/06/2017    History of Present Illness Pt s/p R anterior THA 05/05/2017. PMH: stroke, HTN, DM, L THA.    PT Comments    Pt able to increase ambulation distance to 60 ft during session; significant increased time and effort to perform ambulation (at least 25 minutes) from bedside chair to nursing desk and back to [facility] for toileting purposes. Pt reports 10/10 R hip pain beginning of session; pt reports 3/10 R hip pain during initial ambulation increasing to 5/10 R hip pain end of ambulation; 3/10 R hip pain end of session in bed. Pt continues to require vc's for B LE and B UE placement during sit to stand transfers. Next session focus on increasing activity tolerance.   Follow Up Recommendations  SNF     Equipment Recommendations  Rolling walker with 5" wheels    Recommendations for Other Services       Precautions / Restrictions Precautions Precautions: Anterior Hip;Fall Precaution Booklet Issued: Yes (comment) Restrictions Weight Bearing Restrictions: Yes RLE Weight Bearing: Weight bearing as tolerated    Mobility  Bed Mobility Overal bed mobility: Needs Assistance Bed Mobility: Sit to Supine       Sit to supine: Mod assist;HOB elevated   General bed mobility comments: pt requires +1 mod assist for R LE with HOB elevated; pt requires use of bed rails for repositioning in bed with increased time/effort noted  Transfers Overall transfer level: Needs assistance Equipment used: Rolling walker (2 wheeled) Transfers: Sit to/from Stand Sit to Stand: Min assist         General transfer comment: pt continues to require min assist for initial stand; vc's for B LE and B UE placement  Ambulation/Gait Ambulation/Gait assistance: Min guard Ambulation Distance (Feet): 60 Feet Assistive device: Rolling walker (2 wheeled) Gait Pattern/deviations: Step-to  pattern;Decreased stance time - right Gait velocity: significantly decreased   General Gait Details: pt able to ambulate from bedside chair to nursing desk and back to [facility] with CGA and RW for safety with chair follow; significant increased time/effort to perform (at least 25 minutes to perform)   Stairs            Wheelchair Mobility    Modified Rankin (Stroke Patients Only)       Balance Overall balance assessment: Needs assistance Sitting-balance support: No upper extremity supported;Feet supported Sitting balance-Leahy Scale: Fair Sitting balance - Comments: pt able to maintain static sitting balance with no UE support and B feet support with no overt loss of balance noted   Standing balance support: Bilateral upper extremity supported Standing balance-Leahy Scale: Fair Standing balance comment: pt able to remove B UE from RW to wipe after use of BSC with no overt loss of balance noted                            Cognition Arousal/Alertness: Awake/alert Behavior During Therapy: WFL for tasks assessed/performed Overall Cognitive Status: Within Functional Limits for tasks assessed                                        Exercises Total Joint Exercises Ankle Circles/Pumps: AROM;Both;10 reps;Seated Quad Sets: AROM;Strengthening;Both;10 reps;Seated (in chair with feet elevated) Gluteal Sets: AROM;Strengthening;Both;10 reps;Seated (in chair with feet elevated) Long Arc Quad: AROM;Strengthening;Right;10 reps;Seated  General Comments        Pertinent Vitals/Pain Pain Assessment: 0-10 Pain Score: 5  Pain Location: pt reports 10/10 R hip pain beginning of session; pt reports 3/10 R hip pain during initial ambulation increasing to 5/10 R hip pain end of ambulation; 3/10 R hip pain end of session in bed Pain Descriptors / Indicators: Aching;Moaning;Operative site guarding;Sore;Tender Pain Intervention(s): Limited activity within patient's  tolerance;Monitored during session;Repositioned HR - 74 to 96 bpm throughout session via pulse ox O2 - 95 to 99% on RA throughout session via pulse ox    Home Living                      Prior Function            PT Goals (current goals can now be found in the care plan section) Acute Rehab PT Goals Patient Stated Goal: to get stronger and return home PT Goal Formulation: With patient Time For Goal Achievement: 05/19/17 Potential to Achieve Goals: Good Progress towards PT goals: Progressing toward goals    Frequency    BID      PT Plan Current plan remains appropriate    Co-evaluation              AM-PAC PT "6 Clicks" Daily Activity  Outcome Measure  Difficulty turning over in bed (including adjusting bedclothes, sheets and blankets)?: A Little Difficulty moving from lying on back to sitting on the side of the bed? : Total Difficulty sitting down on and standing up from a chair with arms (e.g., wheelchair, bedside commode, etc,.)?: Total Help needed moving to and from a bed to chair (including a wheelchair)?: A Little Help needed walking in hospital room?: A Little Help needed climbing 3-5 steps with a railing? : A Lot 6 Click Score: 13    End of Session Equipment Utilized During Treatment: Gait belt Activity Tolerance: Patient limited by fatigue Patient left: in bed;with call bell/phone within reach;with bed alarm set;with family/visitor present;with SCD's reapplied (B heels elevated via towel rolls; SCD's in place and activated) Nurse Communication: Mobility status;Weight bearing status PT Visit Diagnosis: Unsteadiness on feet (R26.81);Other abnormalities of gait and mobility (R26.89);Muscle weakness (generalized) (M62.81);Pain Pain - Right/Left: Right Pain - part of body: Hip     Time: 2330-0762 PT Time Calculation (min) (ACUTE ONLY): 56 min  Charges:                       G CodesLaqueta Carina, SPT 21-May-2017,4:21  PM [phone]

## 2017-05-06 NOTE — Progress Notes (Signed)
Physical Therapy Treatment Patient Details Name: [name] [name] MRN: [id number] DOB: 11-05-1945 Today's Date: 05/06/2017    History of Present Illness Pt s/p R anterior THA 05/05/2017. PMH: stroke, HTN, DM, L THA.    PT Comments    Pt able to increase ambulation distance (20 ft) in the room with CGA and RW during session; pt requires significant extra time to perform. Pt reports 2/10 R hip pain beginning of session; 0/10 R hip pain reported during activity and end of session; pt reports 10/10 R hip pain initially when sitting in bedside chair but the pain went to 0/10 quickly. Pt requires vc's for B LE and B UE placement during sit to stand transfer; increased time/effort noted for functional mobility activities. Next session focus on increasing activity tolerance.   Follow Up Recommendations  SNF     Equipment Recommendations  Rolling walker with 5" wheels    Recommendations for Other Services       Precautions / Restrictions Precautions Precautions: Anterior Hip;Fall Precaution Booklet Issued: Yes (comment) Restrictions Weight Bearing Restrictions: Yes RLE Weight Bearing: Weight bearing as tolerated    Mobility  Bed Mobility Overal bed mobility: Needs Assistance Bed Mobility: Supine to Sit     Supine to sit: Min assist;HOB elevated     General bed mobility comments: pt requires +1 min assist for R LE with HOB elevated; pt requires use of bed rails and increased time/effort noted  Transfers Overall transfer level: Needs assistance Equipment used: Rolling walker (2 wheeled) Transfers: Sit to/from Stand Sit to Stand: Min assist         General transfer comment: pt continues to require min assist for initial stand; vc's for B LE and B UE placement  Ambulation/Gait Ambulation/Gait assistance: Min guard Ambulation Distance (Feet): 20 Feet Assistive device: Rolling walker (2 wheeled) Gait Pattern/deviations: Step-to pattern;Decreased stance time - right Gait  velocity: decreased   General Gait Details: pt able to ambulate around the bed and to her bedside chair with CGA and RW for safety; continued increased time and effort noted but improved since yesterday's session   Stairs            Wheelchair Mobility    Modified Rankin (Stroke Patients Only)       Balance Overall balance assessment: Needs assistance Sitting-balance support: No upper extremity supported;Feet supported Sitting balance-Leahy Scale: Fair Sitting balance - Comments: pt able to maintain static sitting balance with no UE support and B feet support with no overt loss of balance noted   Standing balance support: Bilateral upper extremity supported Standing balance-Leahy Scale: Fair Standing balance comment: pt able to briefly remove B UE from RW with no overt loss of balance noted                            Cognition Arousal/Alertness: Awake/alert Behavior During Therapy: WFL for tasks assessed/performed Overall Cognitive Status: Within Functional Limits for tasks assessed                                        Exercises Total Joint Exercises Ankle Circles/Pumps: AROM;Both;10 reps;Supine Quad Sets: AROM;Strengthening;Both;10 reps;Supine Gluteal Sets: AROM;Strengthening;Both;10 reps;Supine Towel Squeeze: AROM;Strengthening;Both;10 reps;Supine Short Arc Quad: AROM;Strengthening;Right;10 reps;Supine Heel Slides: AAROM;Strengthening;Right;10 reps;Supine Hip ABduction/ADduction: AAROM;Strengthening;Right;10 reps;Supine Straight Leg Raises: AAROM;Strengthening;Right;10 reps;Supine    General Comments  Pertinent Vitals/Pain Pain Assessment: 0-10 Pain Score: 2  Pain Location: pt reports 2/10 R hip pain beginning of session; 0/10 R hip pain reported during activity and end of session; pt reports 10/10 R hip pain initially when sitting in bedside chair but the pain went to 0/10 quickly Pain Descriptors / Indicators:  Aching;Moaning;Operative site guarding;Sore;Tender Pain Intervention(s): Limited activity within patient's tolerance;Monitored during session;Premedicated before session;Repositioned HR - 86 to 98 bpm during session via pulse ox O2 - 95 to 100% on RA throughout session via pulse ox    Home Living                      Prior Function            PT Goals (current goals can now be found in the care plan section) Acute Rehab PT Goals Patient Stated Goal: to get stronger and return home PT Goal Formulation: With patient Time For Goal Achievement: 05/19/17 Potential to Achieve Goals: Good Progress towards PT goals: Progressing toward goals    Frequency    BID      PT Plan Current plan remains appropriate    Co-evaluation              AM-PAC PT "6 Clicks" Daily Activity  Outcome Measure  Difficulty turning over in bed (including adjusting bedclothes, sheets and blankets)?: A Little Difficulty moving from lying on back to sitting on the side of the bed? : Total Difficulty sitting down on and standing up from a chair with arms (e.g., wheelchair, bedside commode, etc,.)?: Total Help needed moving to and from a bed to chair (including a wheelchair)?: A Little Help needed walking in hospital room?: A Little Help needed climbing 3-5 steps with a railing? : A Lot 6 Click Score: 13    End of Session Equipment Utilized During Treatment: Gait belt Activity Tolerance: Patient limited by fatigue Patient left: in chair;with call bell/phone within reach;with chair alarm set;with nursing/sitter in room;with SCD's reapplied (B heels elevated and abduction maintained via pillows; SCD's in place and activated) Nurse Communication: Mobility status;Weight bearing status PT Visit Diagnosis: Unsteadiness on feet (R26.81);Other abnormalities of gait and mobility (R26.89);Muscle weakness (generalized) (M62.81);Pain Pain - Right/Left: Right Pain - part of body: Hip     Time:  1610-9604 PT Time Calculation (min) (ACUTE ONLY): 40 min  Charges:                       G CodesLaqueta Carina, SPT 2017-06-01,1:25 PM [phone]

## 2017-05-06 NOTE — Clinical Social Work Placement (Signed)
   CLINICAL SOCIAL WORK PLACEMENT  NOTE  Date:  05/06/2017  Patient Details  Name: Ilithyia C Gotham MRN: [id number] Date of Birth: 01/26/1946  Clinical Social Work is seeking post-discharge placement for this patient at the [place] level of care (*CSW will initial, date and re-position this form in  chart as items are completed):  Yes   Patient/family provided with [place] Work Department's list of facilities offering this level of care within the geographic area requested by the patient (or if unable, by the patient's family).  Yes   Patient/family informed of their freedom to choose among providers that offer the needed level of care, that participate in Medicare, Medicaid or managed care program needed by the patient, have an available bed and are willing to accept the patient.  Yes   Patient/family informed of [place]'s ownership interest in [facility] and [facility], as well as of the fact that they are under no obligation to receive care at these facilities.  PASRR submitted to EDS on       PASRR number received on       Existing PASRR number confirmed on 05/06/17     FL2 transmitted to all facilities in geographic area requested by pt/family on 05/06/17     FL2 transmitted to all facilities within larger geographic area on       Patient informed that his/her managed care company has contracts with or will negotiate with certain facilities, including the following:        Yes   Patient/family informed of bed offers received.  Patient chooses bed at  ([facility] )     Physician recommends and patient chooses bed at      Patient to be transferred to   on  .  Patient to be transferred to facility by       Patient family notified on   of transfer.  Name of family member notified:        PHYSICIAN       Additional Comment:    _______________________________________________ Anushree Dorsi, Veronia Beets, LCSW 05/06/2017, 2:37 PM

## 2017-05-06 NOTE — Clinical Social Work Note (Signed)
Clinical Social Work Assessment  Patient Details  Name: [name] [name] MRN: [id number] Date of Birth: 24-Jun-1946  Date of referral:  05/06/17               Reason for consult:  Facility Placement                Permission sought to share information with:  [name] [name] granted to share information::  Yes, Verbal Permission Granted  Name::      [name] [name]::   [name] [name]   Relationship::     Contact Information:     Housing/Transportation Living arrangements for the past 2 months:  [name] [name] of Information:  Patient, Adult Children Patient Interpreter Needed:  None Criminal Activity/Legal Involvement Pertinent to Current Situation/Hospitalization:  No - Comment as needed Significant Relationships:  Adult Children, Spouse Lives with:  Spouse Do you feel safe going back to the place where you live?  Yes Need for family participation in patient care:  Yes (Comment)  Care giving concerns:  Patient lives in [place] with her husband [name] [name].    Social Worker assessment / plan:  Holiday representative (CSW) received SNF consult. PT is recommending SNF. CSW met with patient and her daughter [name] [name] was at bedside. Patient was alert and oriented X4 and was sitting in the chair at bedside. CSW introduced self and explained role of CSW department. Patient reported that she lives in [place] with her husband. CSW explained SNF process and that [facility] will have to approve SNF. Patient is agreeable to SNF search in [facility]. FL2 complete and faxed out.   CSW presented bed offers and patient chose [name]. [facility] Medicare SNF authorization has been received from [name] [name]. Authorization # [id number], RVB. Plan is for patient to D/C to [name] when medically stable. [name] liaison is aware of above. Patient is aware of above. CSW will continue to follow and assist as needed.   Employment status:   Retired Nurse, adult PT Recommendations:  [place] / Referral to community resources:  [name] [name]  Patient/Family's Response to care:  Patient is agreeable to D/C to [name].   Patient/Family's Understanding of and Emotional Response to Diagnosis, Current Treatment, and Prognosis:  Patient was very pleasant and thanked CSW for assistance.   Emotional Assessment Appearance:  Appears stated age Attitude/Demeanor/Rapport:    Affect (typically observed):  Accepting, Adaptable, Pleasant Orientation:  Oriented to Place, Oriented to Self, Oriented to  Time, Oriented to Situation Alcohol / Substance use:  Not Applicable Psych involvement (Current and /or in the community):  No (Comment)  Discharge Needs  Concerns to be addressed:  Discharge Planning Concerns Readmission within the last 30 days:  No Current discharge risk:  Dependent with Mobility Barriers to Discharge:  Continued Medical Work up   [employer], Veronia Beets, LCSW 05/06/2017, 2:38 PM

## 2017-05-07 LAB — CBC
HCT: 28.4 % — ABNORMAL LOW (ref 35.0–47.0)
HEMOGLOBIN: 9.6 g/dL — AB (ref 12.0–16.0)
MCH: 30.1 pg (ref 26.0–34.0)
MCHC: 33.8 g/dL (ref 32.0–36.0)
MCV: 89 fL (ref 80.0–100.0)
PLATELETS: 177 10*3/uL (ref 150–440)
RBC: 3.19 MIL/uL — AB (ref 3.80–5.20)
RDW: 14.8 % — ABNORMAL HIGH (ref 11.5–14.5)
WBC: 10.6 10*3/uL (ref 3.6–11.0)

## 2017-05-07 LAB — GLUCOSE, CAPILLARY
Glucose-Capillary: 181 mg/dL — ABNORMAL HIGH (ref 65–99)
Glucose-Capillary: 223 mg/dL — ABNORMAL HIGH (ref 65–99)
Glucose-Capillary: 138 mg/dL — ABNORMAL HIGH (ref 65–99)

## 2017-05-07 LAB — SURGICAL PATHOLOGY

## 2017-05-07 MED ORDER — DOCUSATE SODIUM 100 MG PO CAPS: 100.0000 mg | ORAL_CAPSULE | Freq: Two times a day (BID) | ORAL | 0 refills | Status: DC

## 2017-05-07 MED ORDER — ACETAMINOPHEN 325 MG PO TABS: 650.0000 mg | ORAL_TABLET | Freq: Four times a day (QID) | ORAL | 1 refills | Status: DC | PRN

## 2017-05-07 MED ORDER — ASPIRIN 325 MG PO TABS: 325.0000 mg | ORAL_TABLET | Freq: Two times a day (BID) | ORAL | 0 refills | Status: DC

## 2017-05-07 MED ORDER — OXYCODONE HCL 5 MG PO TABS: 5.0000 mg | ORAL_TABLET | Freq: Four times a day (QID) | ORAL | 0 refills | Status: DC | PRN

## 2017-05-07 NOTE — Progress Notes (Signed)
EMS here to pick patient. Vital signs stable at this time. IV removed

## 2017-05-07 NOTE — Progress Notes (Signed)
Physical Therapy Treatment Patient Details Name: [name] [name] MRN: [id number] DOB: 1945/12/31 Today's Date: 05/07/2017    History of Present Illness Pt s/p R anterior THA 05/05/2017. PMH: stroke, HTN, DM, L THA.    PT Comments    Pt able to increase ambulation distance in room (due to possibility of urgent need for use of BSC) with CGA and RW. Pt reports 0/10 R hip pain beginning of session; pt reports 8/10 R hip pain during activity and end of session; nursing notified. Pt continues to require +1 min assist for initial stand during sit to stand transfers; vc's for B LE and UE placement; pt continues to demonstrate increased time/effort to perform ambulation although improved slightly since last session. Next session focus on activity tolerance.   Follow Up Recommendations  SNF     Equipment Recommendations  Rolling walker with 5" wheels    Recommendations for Other Services       Precautions / Restrictions Precautions Precautions: Anterior Hip;Fall Precaution Booklet Issued: Yes (comment) Restrictions Weight Bearing Restrictions: Yes RLE Weight Bearing: Weight bearing as tolerated    Mobility  Bed Mobility               General bed mobility comments: pt in bedside chair beginning and end of session  Transfers Overall transfer level: Needs assistance Equipment used: Rolling walker (2 wheeled) Transfers: Sit to/from Stand Sit to Stand: Min assist         General transfer comment: pt continues to require min assist for initial stand; vc's for B LE and B UE placement  Ambulation/Gait Ambulation/Gait assistance: Min guard Ambulation Distance (Feet):  (60+40) Assistive device: Rolling walker (2 wheeled) Gait Pattern/deviations: Step-to pattern;Decreased stance time - right Gait velocity: significantly decreased   General Gait Details: pt able to ambulate 60 ft in room with CGA and RW for safety; pt expresses urge to use the BSC; pt able to ambulate 40 ft in  room after use of BSC with CGA and RW; continued increased time/effort to perform ambulation   Stairs            Wheelchair Mobility    Modified Rankin (Stroke Patients Only)       Balance Overall balance assessment: Needs assistance Sitting-balance support: No upper extremity supported;Feet supported Sitting balance-Leahy Scale: Fair Sitting balance - Comments: pt able to maintain static sitting balance with no UE support and B feet support with no overt loss of balance noted   Standing balance support: Bilateral upper extremity supported Standing balance-Leahy Scale: Fair Standing balance comment: pt able to remove B UE from RW to wipe after use of BSC with no overt loss of balance noted                            Cognition Arousal/Alertness: Awake/alert Behavior During Therapy: WFL for tasks assessed/performed Overall Cognitive Status: Within Functional Limits for tasks assessed                                        Exercises Total Joint Exercises Ankle Circles/Pumps: AROM;Both;10 reps;Seated Quad Sets: AROM;Strengthening;Both;10 reps;Seated Gluteal Sets: AROM;Strengthening;Both;10 reps;Seated Long Arc Quad: AROM;Strengthening;Right;10 reps;Seated Marching in Standing: AROM;Strengthening;Both;10 reps;Standing    General Comments        Pertinent Vitals/Pain Pain Assessment: 0-10 Pain Score: 8  Pain Location: pt reports 0/10 R hip pain beginning  of session; pt reports 8/10 R hip pain during activity and end of session; nursing notified Pain Descriptors / Indicators: Aching;Moaning;Operative site guarding;Sore;Tender Pain Intervention(s): Limited activity within patient's tolerance;Monitored during session;Repositioned HR - 96 to 112 bpm throughout session via pulse ox O2 - 96 to 99% on RA via pulse ox    Home Living                      Prior Function            PT Goals (current goals can now be found in the care  plan section) Acute Rehab PT Goals Patient Stated Goal: to get stronger and return home PT Goal Formulation: With patient Time For Goal Achievement: 05/19/17 Potential to Achieve Goals: Good Progress towards PT goals: Progressing toward goals    Frequency    BID      PT Plan Current plan remains appropriate    Co-evaluation              AM-PAC PT "6 Clicks" Daily Activity  Outcome Measure  Difficulty turning over in bed (including adjusting bedclothes, sheets and blankets)?: A Little Difficulty moving from lying on back to sitting on the side of the bed? : Total Difficulty sitting down on and standing up from a chair with arms (e.g., wheelchair, bedside commode, etc,.)?: Total Help needed moving to and from a bed to chair (including a wheelchair)?: A Little Help needed walking in hospital room?: A Little Help needed climbing 3-5 steps with a railing? : A Lot 6 Click Score: 13    End of Session Equipment Utilized During Treatment: Gait belt Activity Tolerance: Patient limited by fatigue Patient left: in chair;with call bell/phone within reach;with chair alarm set;with family/visitor present;with SCD's reapplied (B heels elevated and hip abduction maintained via pillows; SCD's in place and activated) Nurse Communication: Mobility status;Weight bearing status PT Visit Diagnosis: Unsteadiness on feet (R26.81);Other abnormalities of gait and mobility (R26.89);Muscle weakness (generalized) (M62.81);Pain Pain - Right/Left: Right Pain - part of body: Hip     Time: 0950-1030 PT Time Calculation (min) (ACUTE ONLY): 40 min  Charges:                       G CodesLaqueta Carina, SPT May 22, 2017,1:54 PM [phone]

## 2017-05-07 NOTE — Progress Notes (Signed)
Report called to [facility] resources. Report given to [facility] called for transportation. Printed prescription for oxycodone in transport package along with printed AVS.

## 2017-05-07 NOTE — Progress Notes (Addendum)
Patient is medically stable for D/C to [facility] today. [facility] Medicare SNF authorization has been received. Per [facility] liaison patient can come today to room 503. RN will call report to RN Theressa Stamps at [phone] and arrange EMS for transport. Clinical Education officer, museum (CSW) sent D/C orders to [facility] today via HUB. Patient is aware of above. Patient's daughter [name] is at bedside and aware of D/C today. Please reconsult if future social work needs arise. CSW signing off.   [employer], LCSW [phone]

## 2017-05-07 NOTE — Discharge Instructions (Signed)
Patient will be discharged home on anterior hip precautions. Patient will remain full weightbearing on the operative extremity. They will use a walker for assistance with ambulation until cleared by physical therapy. Patient may shower. No bath or submerging the wound. Patient should continue physical therapy for gait training, lower extremity strengthening and hip range of motion.  Call [phone] with any questions, such as fever > 101.5 degrees, drainage from the wound or shortness of breath.  Patient may remove dressing after one week. If incision is clean and dry, there is no need to re-apply a new dressing.

## 2017-05-07 NOTE — Care Management Important Message (Signed)
Important Message  Patient Details  Name: [name] [name] MRN: [id number] Date of Birth: 1946/06/27   Medicare Important Message Given:  Yes    Jolly Mango, RN 05/07/2017, 9:00 AM

## 2017-05-07 NOTE — Progress Notes (Signed)
Inpatient Diabetes Program Recommendations  AACE/ADA: New Consensus Statement on Inpatient Glycemic Control (2015)  Target Ranges:  Prepandial:   less than 140 mg/dL      [facility] postprandial:   less than 180 mg/dL (1-2 hours)      Critically ill patients:  140 - 180 mg/dL   Lab Results  Component Value Date   GLUCAP 223 (H) 05/07/2017    Review of Glycemic ControlResults for LUBA, MATZEN (MRN [id number]) as of 05/07/2017 14:21  Ref. Range 05/05/2017 20:44 05/06/2017 07:29 05/06/2017 11:23 05/06/2017 16:59 05/06/2017 20:51 05/07/2017 07:41 05/07/2017 11:43  Glucose-Capillary Latest Ref Range: 65 - 99 mg/dL 239 (H) 186 (H) 178 (H) 141 (H) 132 (H) 181 (H) 223 (H)   Diabetes history: Type 2 diabetes Outpatient Diabetes medications: Januvia 100 mg daily, Actos 45 mg daily, Glipizide 10 mg bid  Current orders for Inpatient glycemic control:  Novolog sensitive tid with meals, Glucotrol 10 mg bid, Actos 45 mg daily, Tradjenta 5 mg daily (hospital substitution for Januvia) Inpatient Diabetes Program Recommendations:    Referral received and glycemic control reviewed.  Note that home oral medications restarted.  No current A1C available.  CBG's well controlled overall.  Will follow.  Thanks, Adah Perl, RN, BC-ADM Inpatient Diabetes Coordinator Pager [phone] (8a-5p)

## 2017-05-07 NOTE — Clinical Social Work Placement (Signed)
   CLINICAL SOCIAL WORK PLACEMENT  NOTE  Date:  05/07/2017  Patient Details  Name: [name] [name] MRN: [id number] Date of Birth: 12-09-1945  Clinical Social Work is seeking post-discharge placement for this patient at the [facility] level of care (*CSW will initial, date and re-position this form in  chart as items are completed):  Yes   Patient/family provided with [facility] Work Department's list of facilities offering this level of care within the geographic area requested by the patient (or if unable, by the patient's family).  Yes   Patient/family informed of their freedom to choose among providers that offer the needed level of care, that participate in Medicare, Medicaid or managed care program needed by the patient, have an available bed and are willing to accept the patient.  Yes   Patient/family informed of 's ownership interest in [facility] and [facility], as well as of the fact that they are under no obligation to receive care at these facilities.  PASRR submitted to EDS on       PASRR number received on       Existing PASRR number confirmed on 05/06/17     FL2 transmitted to all facilities in geographic area requested by pt/family on 05/06/17     FL2 transmitted to all facilities within larger geographic area on       Patient informed that his/her managed care company has contracts with or will negotiate with certain facilities, including the following:        Yes   Patient/family informed of bed offers received.  Patient chooses bed at  ([facility] )     Physician recommends and patient chooses bed at      Patient to be transferred to  ([facility] ) on 05/07/17.  Patient to be transferred to facility by  [facility] )     Patient family notified on 05/07/17 of transfer.  Name of family member notified:   ([name] is at bedside and aware of D/C today. )     PHYSICIAN       Additional Comment:     _______________________________________________ Celica Kotowski, Veronia Beets, LCSW 05/07/2017, 8:48 AM

## 2017-05-07 NOTE — Discharge Summary (Signed)
Physician Discharge Summary  Patient ID: [name] [name] MRN: [id number] DOB/AGE: 70/29/1947 70 y.o.  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 Arthritis   . Diabetes mellitus without complication ([place])   . Hypertension   . Stroke [facility])    "light stoke" No residual deficits    Surgeries: Procedure(s): TOTAL HIP ARTHROPLASTY ANTERIOR APPROACH on 05/05/2017   Consultants (if any):   Discharged Condition: Improved  Hospital Course: [name] [name] is an 71 y.o. 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    She was given perioperative antibiotics:  Anti-infectives    Start     Dose/Rate Route Frequency Ordered Stop   05/05/17 1400  ceFAZolin (ANCEF) IVPB 2g/100 mL premix     2 g 200 mL/hr over 30 Minutes Intravenous Every 6 hours 05/05/17 1227 05/05/17 1956   05/05/17 0830  50,000 units bacitracin in 0.9% normal saline 250 mL irrigation  Status:  Discontinued       As needed 05/05/17 0853 05/05/17 1046   05/05/17 0606  ceFAZolin (ANCEF) 2-4 GM/100ML-% IVPB    Comments:  Dewayne Hatch   : cabinet override      05/05/17 0606 05/05/17 0801   05/05/17 0600  ceFAZolin (ANCEF) IVPB 2g/100 mL premix     2 g 200 mL/hr over 30 Minutes Intravenous On call to O.R. 05/04/17 2217 05/05/17 0813    .  She was given sequential compression devices, early ambulation, and ECASA for DVT prophylaxis.  She benefited maximally from the hospital stay and there were no complications.    Recent vital signs:  Vitals:    05/06/17 2339 05/07/17 0753  BP: (!) 107/57 (!) 117/54  Pulse: 92 95  Resp: 16 16  Temp: 99.6 F (37.6 C) 98.5 F (36.9 C)  SpO2: 100% 93%    Recent laboratory studies:  Lab Results  Component Value Date   HGB 9.6 (L) 05/07/2017   HGB 9.8 (L) 05/06/2017   HGB 11.5 (L) 11/08/2016   Lab Results  Component Value Date   WBC 10.6 05/07/2017   PLT 177 05/07/2017   Lab Results  Component Value Date   INR 1.0 12/23/2013   Lab Results  Component Value Date   NA 132 (L) 05/06/2017   K 4.2 05/06/2017   CL 105 05/06/2017   CO2 22 05/06/2017   BUN 21 (H) 05/06/2017   CREATININE 1.14 (H) 05/06/2017   GLUCOSE 232 (H) 05/06/2017    Discharge Medications:   Allergies as of 05/07/2017      Reactions   Gabapentin    Kidney doctor told me not to take   Nsaids    Kidney doctor told me not to take      Medication List    TAKE these medications   acarbose 100 MG tablet Commonly known as:  PRECOSE Take 100 mg by mouth 3 (three) times daily with meals.   acetaminophen 325 MG tablet Commonly known as:  TYLENOL Take 2 tablets (650 mg total) by mouth every 6 (six) hours as needed for mild pain (or Fever >/= 101).   aspirin  325 MG tablet Take 1 tablet (325 mg total) by mouth 2 (two) times daily. What changed:  when to take this   atorvastatin 20 MG tablet Commonly known as:  LIPITOR Take 20 mg by mouth daily.   docusate sodium 100 MG capsule Commonly known as:  COLACE Take 1 capsule (100 mg total) by mouth 2 (two) times daily.   glipiZIDE 10 MG tablet Commonly known as:  GLUCOTROL Take 10 mg by mouth 2 (two) times daily before a meal.   lisinopril-hydrochlorothiazide 20-12.5 MG tablet Commonly known as:  PRINZIDE,ZESTORETIC Take 1 tablet by mouth daily.   oxyCODONE 5 MG immediate release tablet Commonly known as:  Oxy IR/ROXICODONE Take 1-2 tablets (5-10 mg total) by mouth every 6 (six) hours as needed for moderate pain or breakthrough pain.   pioglitazone 45 MG  tablet Commonly known as:  ACTOS Take 45 mg by mouth daily.   sitaGLIPtin 100 MG tablet Commonly known as:  JANUVIA Take 100 mg by mouth daily.       Diagnostic Studies: Dg Pelvis Portable  Result Date: 05/05/2017 CLINICAL DATA:  Status post right anterior approach hip arthroplasty. EXAM: PORTABLE PELVIS 1-2 VIEWS COMPARISON:  Intraoperative fluoro spot radiograph of earlier today. FINDINGS: The patient has undergone right total hip joint prosthesis placement. Radiographic positioning of the prosthetic components is good. The interface with the native bone appears normal. There is a pre-existing left hip joint prosthesis. IMPRESSION: No immediate complication following anterior approach right hip joint prosthesis placement. Electronically Signed   By: David  Martinique M.D.   On: 05/05/2017 11:26   Dg Hip Operative Unilat W Or W/o Pelvis Right  Result Date: 05/05/2017 CLINICAL DATA:  Intraoperative fluoro spot views for right anterior approach hip arthroplasty. Reported fluoro time 24 seconds EXAM: OPERATIVE right HIP (WITH PELVIS IF PERFORMED) 2 VIEWS TECHNIQUE: Fluoroscopic spot image(s) were submitted for interpretation post-operatively. COMPARISON:  AP view of the pelvis from a bone survey dated November 14, 2016 FINDINGS: The patient has undergone placement of a total right hip joint prosthesis. The femoral component of the prosthesis appears appropriately position with normal appearing interface with the native bone. The acetabular component is not completely included in the field of view. IMPRESSION: Two intraoperative fluoro spot images revealing placement of a hip joint prosthesis. No immediate complication is observed. Electronically Signed   By: David  Martinique M.D.   On: 05/05/2017 10:22    Disposition: 01-Home or Self Care    Contact information for after-discharge care    Destination    [facility] .   Specialty:  [place]  information: [address] [place] [phone]               Signed: Lovell Sheehan ,MD 05/07/2017, 8:28 AM

## 2017-05-15 DIAGNOSIS — Z96641 Presence of right artificial hip joint: Secondary | ICD-10-CM | POA: Insufficient documentation

## 2017-07-04 ENCOUNTER — Other Ambulatory Visit: Payer: Self-pay | Admitting: Internal Medicine

## 2017-07-04 DIAGNOSIS — Z1231 Encounter for screening mammogram for malignant neoplasm of breast: Secondary | ICD-10-CM

## 2017-08-11 ENCOUNTER — Ambulatory Visit
Admission: RE | Admit: 2017-08-11 | Discharge: 2017-08-11 | Disposition: A | Discharge status: Home / Self Care | Payer: Medicare Other | Source: Ambulatory Visit | Attending: Internal Medicine | Admitting: Internal Medicine

## 2017-08-11 DIAGNOSIS — Z1231 Encounter for screening mammogram for malignant neoplasm of breast: Secondary | ICD-10-CM | POA: Insufficient documentation

## 2017-10-14 ENCOUNTER — Other Ambulatory Visit: Payer: Self-pay | Admitting: *Deleted

## 2017-11-28 ENCOUNTER — Other Ambulatory Visit: Payer: Self-pay | Admitting: Orthopedic Surgery

## 2017-11-28 DIAGNOSIS — G8929 Other chronic pain: Secondary | ICD-10-CM

## 2017-11-28 DIAGNOSIS — M545 Low back pain: Principal | ICD-10-CM

## 2017-12-05 ENCOUNTER — Other Ambulatory Visit: Payer: Self-pay | Admitting: Orthopedic Surgery

## 2017-12-05 DIAGNOSIS — M5416 Radiculopathy, lumbar region: Secondary | ICD-10-CM

## 2017-12-05 DIAGNOSIS — M545 Low back pain: Principal | ICD-10-CM

## 2017-12-05 DIAGNOSIS — G8929 Other chronic pain: Secondary | ICD-10-CM

## 2017-12-10 ENCOUNTER — Ambulatory Visit
Admission: RE | Admit: 2017-12-10 | Discharge: 2017-12-10 | Disposition: A | Discharge status: Home / Self Care | Payer: Medicare Other | Source: Ambulatory Visit | Attending: Orthopedic Surgery | Admitting: Orthopedic Surgery

## 2017-12-10 DIAGNOSIS — M48061 Spinal stenosis, lumbar region without neurogenic claudication: Secondary | ICD-10-CM | POA: Diagnosis not present

## 2017-12-10 DIAGNOSIS — M545 Low back pain, unspecified: Secondary | ICD-10-CM

## 2017-12-10 DIAGNOSIS — G8929 Other chronic pain: Secondary | ICD-10-CM

## 2017-12-10 DIAGNOSIS — M5416 Radiculopathy, lumbar region: Secondary | ICD-10-CM

## 2017-12-15 DIAGNOSIS — G8929 Other chronic pain: Secondary | ICD-10-CM | POA: Insufficient documentation

## 2017-12-15 DIAGNOSIS — M5441 Lumbago with sciatica, right side: Secondary | ICD-10-CM

## 2018-06-24 ENCOUNTER — Other Ambulatory Visit: Payer: Self-pay | Admitting: Anesthesiology

## 2018-06-24 DIAGNOSIS — M5441 Lumbago with sciatica, right side: Secondary | ICD-10-CM

## 2018-06-27 ENCOUNTER — Ambulatory Visit (HOSPITAL_COMMUNITY)
Admission: RE | Admit: 2018-06-27 | Discharge: 2018-06-27 | Disposition: A | Discharge status: Home / Self Care | Payer: Medicare Other | Source: Ambulatory Visit | Attending: Anesthesiology | Admitting: Anesthesiology

## 2018-06-27 DIAGNOSIS — M5116 Intervertebral disc disorders with radiculopathy, lumbar region: Secondary | ICD-10-CM | POA: Diagnosis not present

## 2018-06-27 DIAGNOSIS — M5441 Lumbago with sciatica, right side: Secondary | ICD-10-CM

## 2018-06-27 DIAGNOSIS — M48061 Spinal stenosis, lumbar region without neurogenic claudication: Secondary | ICD-10-CM | POA: Insufficient documentation

## 2018-06-30 NOTE — Progress Notes (Signed)
Patient's Name: [name] [name]  MRN: [id number]  Referring Provider: Jodi Marble, MD  DOB: [birth year]  PCP: Jodi Marble, MD  DOS: 07/01/2018  Note by: Gaspar Cola, MD  Service setting: Ambulatory outpatient  Specialty: Interventional Pain Management  Location: [facility] (AMB) Pain Management Facility    Patient type: New patient ("FAST-TRACK" Evaluation)   Warning: This referral option does not include the extensive pharmacological evaluation required for [place] to take over the patient's medication management. The "Fast-Track" system is designed to bypass the new patient referral waiting list, as well as the normal patient evaluation process, in order to provide a patient in distress with a timely pain management intervention. Because the system was not designed to unfairly get a patient into our pain practice ahead of those already waiting, certain restrictions apply. By requesting a "Fast-Track" consult, the referring physician has opted to continue managing the patient's medications in order to get interventional urgent care.  Primary Reason for Visit: Interventional Pain Management Treatment. CC: Leg Pain (right)  HPI  [name] is a 72 y.o. year old, female patient, who comes today for a  "Fast-Track" new patient evaluation, as requested by Jodi Marble, MD. The patient has been made aware that this type of referral option is reserved for the Interventional Pain Management portion of our practice and completely excludes the option of medication management. Her primarily concern today is the Leg Pain (right)  Pain Assessment: Location: Right Leg(pain in left hand also) Radiating: pain radiaties down right leg to toes Onset: More than a month ago Duration: Chronic pain Quality: Aching, Pounding Severity: 10-Worst pain ever/10 (subjective, self-reported pain score)  Note: Reported level is inconsistent with clinical observations. Clinically the patient looks like a 5/10 A  5/10 is viewed as "Severe" and described as intense and extremely unpleasant. Associated with frowning face and frequent crying. Pain overwhelms the senses.  Ability to do any activity or maintain social relationships becomes significantly limited. Conversation becomes difficult. Pacing back and forth is common, as getting into a comfortable position is nearly impossible. Pain wakes you up from deep sleep. Physical signs will be obvious: pupillary dilation; increased sweating; goosebumps; brisk reflexes; cold, clammy hands and feet; nausea, vomiting or dry heaves; loss of appetite; significant sleep disturbance with inability to fall asleep or to remain asleep. When persistent, significant weight loss is observed due to the complete loss of appetite and sleep deprivation.  Blood pressure and heart rate becomes significantly elevated. Information on the proper use of the pain scale provided to the patient today. When using our objective Pain Scale, levels between 6 and 10/10 are said to belong in an emergency room, as it progressively worsens from a 6/10, described as severely limiting, requiring emergency care not usually available at an outpatient pain management facility. At a 6/10 level, communication becomes difficult and requires great effort. Assistance to reach the emergency department may be required. Facial flushing and profuse sweating along with potentially dangerous increases in heart rate and blood pressure will be evident. Effect on ADL: unable to do anything Timing: Constant Modifying factors: tylenol BP: (!) 154/79  HR: 87  Onset and Duration: Sudden Cause of pain: Unknown Severity: Getting worse, NAS-11 at its worse: 10/10, NAS-11 at its best: 7/10, NAS-11 now: 10/10 and NAS-11 on the average: 10/10 Timing: Not influenced by the time of the day Aggravating Factors: Prolonged sitting, Prolonged standing, Walking, Walking uphill and Walking downhill Alleviating Factors: Medications and  Sitting Associated Problems:  Nausea, Numbness, Swelling, Weakness, Pain that wakes patient up and Pain that does not allow patient to sleep Quality of Pain: Aching and Throbbing Previous Examinations or Tests: MRI scan and X-rays Previous Treatments: The patient denies any treatment  The patient comes into the clinics today, referred to [place] for a lumbar epidural steroid injection.  The patient refers that she used to get similar injections from Dr. Robley Fries (pain management specialist), but apparently he is retiring and told her that he would not be doing those injections anymore.  At this point, we are not entirely sure if she has had an epidural steroid injection or a transforaminal epidural steroid injection done in the past.  Today we will make an effort to get copies of those notes.  The patient recently had an MRI done which reveals problems with foraminal stenosis as well as degenerative disc disease.  (See below)  The patient was sent in for an evaluation first due to the patient's history of diabetes and also taking 650 mg of ASA daily.  Meds   Current Outpatient Medications:  .  acarbose (PRECOSE) 100 MG tablet, Take 100 mg by mouth 3 (three) times daily with meals. , Disp: , Rfl:  .  acetaminophen (TYLENOL) 325 MG tablet, Take 2 tablets (650 mg total) by mouth every 6 (six) hours as needed for mild pain (or Fever >/= 101)., Disp: 60 tablet, Rfl: 1 .  aspirin 325 MG tablet, Take 1 tablet (325 mg total) by mouth 2 (two) times daily., Disp: 60 tablet, Rfl: 0 .  atorvastatin (LIPITOR) 20 MG tablet, Take 20 mg by mouth daily. , Disp: , Rfl:  .  glipiZIDE (GLUCOTROL) 10 MG tablet, Take 10 mg by mouth 2 (two) times daily before a meal. , Disp: , Rfl: 0 .  lisinopril-hydrochlorothiazide (PRINZIDE,ZESTORETIC) 20-12.5 MG tablet, Take 1 tablet by mouth daily., Disp: , Rfl:  .  pioglitazone (ACTOS) 45 MG tablet, Take 45 mg by mouth daily. , Disp: , Rfl: 0 .  sitaGLIPtin (JANUVIA) 100 MG tablet,  Take 100 mg by mouth daily. , Disp: , Rfl:   Imaging Review  Lumbosacral Imaging: Lumbar MR wo contrast:  Results for orders placed during the hospital encounter of 06/27/18  MR LUMBAR SPINE WO CONTRAST   Narrative CLINICAL DATA:  72 year old female with low back pain, right side sciatica. Monoclonal gammopathy.  EXAM: MRI LUMBAR SPINE WITHOUT CONTRAST  TECHNIQUE: Multiplanar, multisequence MR imaging of the lumbar spine was performed. No intravenous contrast was administered.  COMPARISON:  Lumbar MRI 12/10/2017. skeletal survey 11/14/2016.  FINDINGS: Segmentation: Transitional. Full size ribs at T12 and hypoplastic ribs at L1 demonstrated on the February comparison. This numbering system differs from that on 12/10/2017, today a nearly full size S1-S2 disc space is designated but the S1 level is otherwise sacralized. Correlation with radiographs is recommended prior to any operative intervention.  Alignment: Stable preserved lumbar lordosis with mild dextroconvex spinal curvature. Mild grade 1 anterolisthesis of L4 on L5.  Vertebrae: Degenerative appearing left lateral endplate marrow edema at L2-L3. Background bone marrow signal appears normal. No suspicious marrow lesion identified. Intact visible sacrum and SI joints.  Conus medullaris and cauda equina: Conus extends to the L1 level. No lower spinal cord or conus signal abnormality.  Paraspinal and other soft tissues: Negative.  Disc levels:  No lower thoracic spinal stenosis.  L1-L2:  Mild disc bulge.  No stenosis.  L2-L3: Chronic disc desiccation disc space loss and endplate degeneration. Left eccentric circumferential disc  bulge and endplate spurring with broad-based left subarticular and foraminal component. Mild facet and ligament flavum hypertrophy. No spinal stenosis or convincing lateral recess stenosis. Stable moderate to severe left L2 foraminal stenosis. No right side stenosis.  L3-L4:  Circumferential disc bulge with mild to moderate facet and ligament flavum hypertrophy. Chronic left facet joint fluid. No spinal or lateral recess stenosis. Moderate left and mild right L3 foraminal stenosis is stable.  L4-L5: Grade 1 anterolisthesis. Circumferential disc bulge with broad-based posterior and subarticular components. Severe facet and mild to moderate ligament flavum hypertrophy. Mild bilateral lateral recess stenosis without spinal stenosis (L5 nerve levels). Mild left and moderate right L4 foraminal stenosis (series 3, image 6. This level is stable.  L5-S1: Circumferential but mostly far lateral disc bulging. Severe right and mild to moderate left facet hypertrophy. Chronic right facet joint fluid. No spinal stenosis. Mild right lateral recess stenosis (right S1 nerve level). Mild to moderate right L5 foraminal stenosis (series 3, image 6). This level is stable.  S1-S2: Vestigial disc space, otherwise negative.  IMPRESSION: 1. Transitional lumbosacral anatomy with a numbering system today, based on the skeletal survey in February, that differs from the 12/10/2017 MRI. Correlation with radiographs is recommended prior to any operative intervention. 2. In terms of right side radiculopathy there is L4-L5 and L5-S1 degeneration with up to moderate stenosis at the right L4 and L5 nerve levels, plus mild right lateral recess stenosis affecting the right S1 nerve level. 3. Advanced disc and endplate degeneration at L2-L3 with moderate to severe left L2 foraminal stenosis. Moderate left foraminal stenosis at the L3-L4 level.   Electronically Signed   By: Genevie Ann M.D.   On: 06/27/2018 12:32    Complexity Note: Imaging results reviewed. Results shared with Ms. Falkner, using Layman's terms.                         ROS  Cardiovascular: Daily Aspirin intake Pulmonary or Respiratory: Snoring  Neurological: Stroke (Residual deficits or weakness: unknown) Review of  Past Neurological Studies: No results found for this or any previous visit. Psychological-Psychiatric: No reported psychological or psychiatric signs or symptoms such as difficulty sleeping, anxiety, depression, delusions or hallucinations (schizophrenial), mood swings (bipolar disorders) or suicidal ideations or attempts Gastrointestinal: No reported gastrointestinal signs or symptoms such as vomiting or evacuating blood, reflux, heartburn, alternating episodes of diarrhea and constipation, inflamed or scarred liver, or pancreas or irrregular and/or infrequent bowel movements Genitourinary: No reported renal or genitourinary signs or symptoms such as difficulty voiding or producing urine, peeing blood, non-functioning kidney, kidney stones, difficulty emptying the bladder, difficulty controlling the flow of urine, or chronic kidney disease Hematological: No reported hematological signs or symptoms such as prolonged bleeding, low or poor functioning platelets, bruising or bleeding easily, hereditary bleeding problems, low energy levels due to low hemoglobin or being anemic Endocrine: High blood sugar requiring insulin (IDDM) Rheumatologic: Rheumatoid arthritis Musculoskeletal: Negative for myasthenia gravis, muscular dystrophy, multiple sclerosis or malignant hyperthermia Work History: Working part time  Allergies  Ms. Bumgardner is allergic to gabapentin and nsaids.  Laboratory Chemistry  Inflammation Markers (CRP: Acute Phase) (ESR: Chronic Phase) No results found.  Rheumatology Markers No results found.  Renal Function Markers Lab Results  Component Value Date   BUN 21 (H) 05/06/2017   CREATININE 1.14 (H) 05/06/2017   GFRAA 55 (L) 05/06/2017   GFRNONAA 48 (L) 05/06/2017  Hepatic Function Markers Lab Results  Component Value Date   AST 19 11/08/2016   ALT 15 11/08/2016   ALBUMIN 4.1 11/08/2016   ALKPHOS 64 11/08/2016   LIPASE 241 12/23/2013                         Electrolytes Lab Results  Component Value Date   NA 132 (L) 05/06/2017   K 4.2 05/06/2017   CL 105 05/06/2017   CALCIUM 8.3 (L) 05/06/2017                        Neuropathy Markers No results found.  CNS Tests No results found.  Bone Pathology Markers No results found.  Coagulation Parameters Lab Results  Component Value Date   INR 1.0 12/23/2013   LABPROT 12.9 12/23/2013   APTT 37.6 (H) 12/23/2013   PLT 177 05/07/2017                        Cardiovascular Markers Lab Results  Component Value Date   CKTOTAL 98 12/23/2013   CKMB 1.0 12/23/2013   TROPONINI < 0.02 12/23/2013   HGB 9.6 (L) 05/07/2017   HCT 28.4 (L) 05/07/2017                         Note: Lab results reviewed.  PFSH  Drug: Ms. Lehn  reports that she does not use drugs. Alcohol:  reports that she does not drink alcohol. Tobacco:  reports that she quit smoking about 6 years ago. She has never used smokeless tobacco. Medical:  has a past medical history of Arthritis, Diabetes mellitus without complication ([place]), Hypertension, and Stroke ([place]). Family: family history includes Alcohol abuse in her father; Heart disease in her mother.  Past Surgical History:  Procedure Laterality Date  . CHOLECYSTECTOMY    . HIP SURGERY    . JOINT REPLACEMENT     left hip  . TOTAL HIP ARTHROPLASTY Right 05/05/2017   Procedure: TOTAL HIP ARTHROPLASTY ANTERIOR APPROACH;  Surgeon: Lovell Sheehan, MD;  Location: [facility] ORS;  Service: Orthopedics;  Laterality: Right;   Active Ambulatory Problems    Diagnosis Date Noted  . Diabetes mellitus type 2, uncomplicated ([place]) 64/33/2951  . MGUS (monoclonal gammopathy of unknown significance) 11/22/2016  . Status post THR (total hip replacement) 05/05/2017  . Chronic low back pain (Secondary Area of Pain) (Bilateral) w/ sciatica (Right) 12/15/2017  . 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) 03/03/2017  . Hx of total hip arthroplasty (Right)  05/15/2017  . DDD (degenerative disc disease), lumbar 07/01/2018  . Abnormal MRI, lumbar spine 07/01/2018  . Chronic lower extremity pain (Primary Area of Pain) (Right) 07/01/2018  . Lumbosacral radiculopathy at L5 (Right) 07/01/2018  . Chronic lumbar radicular pain (L5 dermatome) (Right) 07/01/2018  . Lumbar spondylosis 07/01/2018  . Lumbar spondylosis with radiculopathy (Right) 07/01/2018  . S/P total hip replacement (Right) (05/05/2018) 07/01/2018  . Lumbar Grade 1 Anterolisthesis of L4 over L5 07/01/2018  . Lumbar facet hypertrophy (Bilateral) 07/01/2018  . 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 Lumbar facet arthropathy 07/01/2018   Resolved Ambulatory Problems    Diagnosis Date Noted  . No Resolved Ambulatory Problems   Past Medical History:  Diagnosis Date  . Arthritis   . Diabetes  mellitus without complication ([place])   . Hypertension   . Stroke [facility])    Constitutional Exam  General appearance: Well nourished, well developed, and well hydrated. In no apparent acute distress Vitals:   07/01/18 1102  BP: (!) 154/79  Pulse: 87  Temp: 97.9 F (36.6 C)  SpO2: 100%  Weight: 196 lb (88.9 kg)  Height: 5' 4"  (1.626 m)   BMI Assessment: Estimated body mass index is 33.64 kg/m as calculated from the following:   Height as of this encounter: 5' 4"  (1.626 m).   Weight as of this encounter: 196 lb (88.9 kg).  BMI interpretation table: BMI level Category Range association with higher incidence of chronic pain  <18 kg/m2 Underweight   18.5-24.9 kg/m2 Ideal body weight   25-29.9 kg/m2 Overweight Increased incidence by 20%  30-34.9 kg/m2 Obese (Class I) Increased incidence by 68%  35-39.9 kg/m2 Severe obesity (Class II) Increased incidence by 136%  >40 kg/m2 Extreme obesity (Class III) Increased incidence by 254%   Patient's current BMI Ideal  Body weight  Body mass index is 33.64 kg/m. Ideal body weight: 54.7 kg (120 lb 9.5 oz) Adjusted ideal body weight: 68.4 kg (150 lb 12.1 oz)   BMI Readings from Last 4 Encounters:  07/01/18 33.64 kg/m  05/05/17 35.87 kg/m  04/28/17 35.87 kg/m  11/22/16 35.76 kg/m   Wt Readings from Last 4 Encounters:  07/01/18 196 lb (88.9 kg)  05/05/17 209 lb (94.8 kg)  04/28/17 209 lb (94.8 kg)  11/22/16 208 lb 5.4 oz (94.5 kg)  Psych/Mental status: Alert, oriented x 3 (person, place, & time)       Eyes: PERLA Respiratory: No evidence of acute respiratory distress  Lumbar Spine Area Exam  Skin & Axial Inspection: No masses, redness, or swelling Alignment: Symmetrical Functional ROM: Decreased ROM       Stability: No instability detected Muscle Tone/Strength: Increased muscle tone over affected area Sensory (Neurological): Dermatomal pain pattern Palpation: Complains of area being tender to palpation       Provocative Tests: Hyperextension/rotation test: (+) bilaterally for facet joint pain. Lumbar quadrant test (Kemp's test): deferred today       Lateral bending test: deferred today       Patrick's Maneuver: deferred today                   FABER test: deferred today                   S-I anterior distraction/compression test: deferred today         S-I lateral compression test: deferred today         S-I Thigh-thrust test: deferred today         S-I Gaenslen's test: deferred today          Gait & Posture Assessment  Ambulation: Limited Gait: Antalgic Posture: Antalgic   Lower Extremity Exam    Side: Right lower extremity  Side: Left lower extremity  Stability: No instability observed          Stability: No instability observed          Skin & Extremity Inspection: Skin color, temperature, and hair growth are WNL. No peripheral edema or cyanosis. No masses, redness, swelling, asymmetry, or associated skin lesions. No contractures.  Skin & Extremity Inspection: Skin color,  temperature, and hair growth are WNL. No peripheral edema or cyanosis. No masses, redness, swelling, asymmetry, or associated skin lesions. No contractures.  Functional ROM: Decreased ROM for  hip and knee joints          Functional ROM: Decreased ROM for hip and knee joints          Muscle Tone/Strength: Guarding  Muscle Tone/Strength: Functionally intact. No obvious neuro-muscular anomalies detected.  Sensory (Neurological): Dermatomal pain pattern L5  Sensory (Neurological): Unimpaired  Palpation: No palpable anomalies  Palpation: No palpable anomalies   Assessment  Primary Diagnosis & Pertinent Problem List: The primary encounter diagnosis was Chronic lower extremity pain (Primary Area of Pain) (Right). 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  Status Diagnosis  Worsening Reoccurring Recurring 1. Chronic lower extremity pain (Primary Area of Pain) (Right)   2. Lumbosacral radiculopathy at L5 (Right)   3. Chronic lumbar radicular pain (L5 dermatome) (Right)   4. Lumbar spondylosis with radiculopathy (Right)   5. Lumbar lateral recess stenosis (Bilateral: L4-5) (Right: L5-S1)   6. 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 DDD (degenerative disc disease), lumbar   8. Lumbar spondylosis   9. Chronic low back pain (Secondary Area  of Pain) (Bilateral) w/ sciatica (Right)   10. Lumbar Grade 1 Anterolisthesis of L4 over L5   11. Lumbar facet arthropathy   12. Lumbar facet hypertrophy (Bilateral)   13. Abnormal MRI, lumbar spine   14. S/P total hip replacement (Right) (05/05/2018)     Problems updated and reviewed during this visit: Problem  Ddd (Degenerative Disc Disease), Lumbar  Abnormal Mri, Lumbar Spine   FINDINGS: Segmentation: Transitional. Full size ribs at T12 and hypoplastic ribs at L1 demonstrated on the February comparison. This numbering system differs from that on 12/10/2017, today a nearly full size S1-S2 disc space is designated but the S1 level is otherwise sacralized. Correlation with radiographs is recommended prior to any operative intervention.  Alignment: Stable preserved lumbar lordosis with mild dextroconvex spinal curvature. Mild grade 1 anterolisthesis of L4 on L5.  Vertebrae: Degenerative appearing left lateral endplate marrow edema at L2-L3. Background bone marrow signal appears normal. No suspicious marrow lesion identified. Intact visible sacrum and SI joints.  Conus medullaris and cauda equina: Conus extends to the L1 level. No lower spinal cord or conus signal abnormality.  Paraspinal and other soft tissues: Negative.  Disc levels:  No lower thoracic spinal stenosis.  L1-L2:  Mild disc bulge.  No stenosis.  L2-L3: Chronic disc desiccation disc space loss and endplate degeneration. Left eccentric circumferential disc bulge and endplate spurring with broad-based left subarticular and foraminal component. Mild facet and ligament flavum hypertrophy. No spinal stenosis or convincing lateral recess stenosis. Stable moderate to severe left L2 foraminal stenosis. No right side stenosis.  L3-L4: Circumferential disc bulge with mild to moderate facet and ligament flavum hypertrophy. Chronic left facet joint fluid. No spinal or lateral recess stenosis. Moderate left and mild  right L3 foraminal stenosis is stable.  L4-L5: Grade 1 anterolisthesis. Circumferential disc bulge with broad-based posterior and subarticular components. Severe facet and mild to moderate ligament flavum hypertrophy. Mild bilateral lateral recess  stenosis without spinal stenosis (L5 nerve levels). Mild left and moderate right L4 foraminal stenosis (series 3, image 6. This level is stable.  L5-S1: Circumferential but mostly far lateral disc bulging. Severe right and mild to moderate left facet hypertrophy. Chronic right facet joint fluid. No spinal stenosis. Mild right lateral recess stenosis (right S1 nerve level). Mild to moderate right L5 foraminal stenosis (series 3, image 6). This level is stable.  S1-S2: Vestigial disc space, otherwise negative.  IMPRESSION: 1. Transitional lumbosacral anatomy with a numbering system today, based on the skeletal survey in February, that differs from the 12/10/2017 MRI. Correlation with radiographs is recommended prior to any operative intervention. 2. In terms of right side radiculopathy there is L4-L5 and L5-S1 degeneration with up to moderate stenosis at the right L4 and L5 nerve levels, plus mild right lateral recess stenosis affecting the right S1 nerve level. 3. Advanced disc and endplate degeneration at L2-L3 with moderate to severe left L2 foraminal stenosis. Moderate left foraminal stenosis at the L3-L4 level.    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 L3-4: Moderate left and mild right L3  foraminal stenosis L4-5: Mild left and moderate right L4 foraminal stenosis L5-S1: Mild to moderate right L5 foraminal stenosis   Lumbar lateral recess stenosis (Bilateral: L4-5) (Right: L5-S1)   L4-5: Mild bilateral lateral recess stenosis (L5 nerve levels). L5-S1: Mild right lateral recess stenosis (right S1 nerve level).   Lumbar Facet Arthropathy   L2-3: Mild facet and ligament flavum hypertrophy. L3-4: Mild to moderate facet and ligament flavum hypertrophy. Chronic left facet joint fluid. L4-5: Severe facet and mild to moderate ligament flavum hypertrophy. L5-S1: Severe right and mild to moderate left facet hypertrophy. Chronic right facet joint fluid.   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 Swango, was spent in counseling/coordination of care regarding: the appropriate use of the pain scale, Ms. Tiberio's primary cause of pain, the results of her recent test(s), the treatment plan, treatment alternatives, the risks and possible complications of proposed treatment, realistic expectations, the goals of pain management (increased in functionality) and the need to collect and read the AVS material.  Plan of Care  Pharmacotherapy (Medications Ordered): No orders of the defined types were placed in this encounter.  As a fast-track, we will not be taking over this patient's medications.  However our evaluation reveals that the patient does have the proper indications for the use of her pain medicine.  It is therefore my opinion that the medication is appropriate and justified.  Before proceeding with the interventional therapies, we will have the patient stop her 650 mg of ASA per day, prior approximately 5 days, after which she should have no platelets available  to assist with appropriate hemostasis after the interventional therapies planned.  Medications administered today: Ellysia C. Moltz had no medications administered during this visit.  Procedure Orders  Lumbar Epidural Injection     Lumbar Transforaminal Epidural Lab Orders  No laboratory test(s) ordered today   Imaging Orders  No imaging studies ordered today   Referral Orders  No referral(s) requested today   Interventional management options: Planned, scheduled, and/or pending:   Diagnostic right-sided L5 transforaminal ESI #1 under fluoroscopic guidance, no sedation. The patient was instructed to stop taking the 650 mg of aspirin per day x5 days.  After that she was instructed that she can go and take 81 mg p.o. every other day.   Considering:   Diagnostic left-sided L2 transforaminal ESI  Diagnostic bilateral L3 transforaminal ESI  Diagnostic bilateral L3 transforaminal ESI  Diagnostic right-sided L5 transforaminal ESI  Diagnostic right-sided L4-5 interlaminar LESI  Diagnostic right-sided L5-S1 interlaminar LESI  Diagnostic bilateral lumbar facet block  Possible bilateral lumbar facet RFA  Diagnostic/therapeutic left-sided L3-4 intra-articular facet joint injection  Diagnostic/therapeutic right-sided L5-S1 intra-articular facet joint injection    Palliative PRN treatment(s):   None at this time   Provider-requested follow-up: Return for (Blood-thinner Protocol)(Stop ASA 325x5days): (R) L5 TFESI + (R) L4-5 LESI #1.  Future Appointments  Date Time Provider [place]  07/09/2018  8:15 AM Milinda Pointer, MD [facility] None   Primary Care Physician: Jodi Marble, MD Location: [facility] Note by: Gaspar Cola, MD Date: 07/01/2018; Time: 4:15 PM

## 2018-06-30 NOTE — Patient Instructions (Addendum)
____________________________________________________________________________________________  Preparing for your procedure (without sedation)  Instructions: . Oral Intake: Do not eat or drink anything for at least 3 hours prior to your procedure. . Transportation: Unless otherwise stated by your physician, you may drive yourself after the procedure. . Blood Pressure Medicine: Take your blood pressure medicine with a sip of water the morning of the procedure. . Blood thinners: Notify our staff if you are taking any blood thinners. Depending on which one you take, there will be specific instructions on how and when to stop it. . Diabetics on insulin: Notify the staff so that you can be scheduled 1st case in the morning. If your diabetes requires high dose insulin, take only  of your normal insulin dose the morning of the procedure and notify the staff that you have done so. . Preventing infections: Shower with an antibacterial soap the morning of your procedure.  . Build-up your immune system: Take 1000 mg of Vitamin C with every meal (3 times a day) the day prior to your procedure. Marland Kitchen Antibiotics: Inform the staff if you have a condition or reason that requires you to take antibiotics before dental procedures. . Pregnancy: If you are pregnant, call and cancel the procedure. . Sickness: If you have a cold, fever, or any active infections, call and cancel the procedure. . Arrival: You must be in the facility at least 30 minutes prior to your scheduled procedure. . Children: Do not bring any children with you. . Dress appropriately: Bring dark clothing that you would not mind if they get stained. . Valuables: Do not bring any jewelry or valuables.  Procedure appointments are reserved for interventional treatments only. Marland Kitchen No Prescription Refills. . No medication changes will be discussed during procedure appointments. . No disability issues will be discussed.  Reasons to call and reschedule or  cancel your procedure: (Following these recommendations will minimize the risk of a serious complication.) . Surgeries: Avoid having procedures within 2 weeks of any surgery. (Avoid for 2 weeks before or after any surgery). . Flu Shots: Avoid having procedures within 2 weeks of a flu shots or . (Avoid for 2 weeks before or after immunizations). . Barium: Avoid having a procedure within 7-10 days after having had a radiological study involving the use of radiological contrast. (Myelograms, Barium swallow or enema study). . Heart attacks: Avoid any elective procedures or surgeries for the initial 6 months after a "Myocardial Infarction" (Heart Attack). . Blood thinners: It is imperative that you stop these medications before procedures. Let us know if you if you take any blood thinner.  . Infection: Avoid procedures during or within two weeks of an infection (including chest colds or gastrointestinal problems). Symptoms associated with infections include: Localized redness, fever, chills, night sweats or profuse sweating, burning sensation when voiding, cough, congestion, stuffiness, runny nose, sore throat, diarrhea, nausea, vomiting, cold or Flu symptoms, recent or current infections. It is specially important if the infection is over the area that we intend to treat. Marland Kitchen Heart and lung problems: Symptoms that may suggest an active cardiopulmonary problem include: cough, chest pain, breathing difficulties or shortness of breath, dizziness, ankle swelling, uncontrolled high or unusually low blood pressure, and/or palpitations. If you are experiencing any of these symptoms, cancel your procedure and contact your primary care physician for an evaluation.  Remember:  Regular Business hours are:  Monday to Thursday 8:00 AM to 4:00 PM  Provider's Schedule: Milinda Pointer, MD:  Procedure days: Tuesday and Thursday 7:30 AM to  4:00 PM  Gillis Santa, MD:  Procedure days: Monday and Wednesday 7:30 AM to 4:00  PM ____________________________________________________________________________________________   ____________________________________________________________________________________________  Blood Thinners  Recommended Time Interval Before and After Neuraxial Block or Catheter Removal  Drug (Generic) Brand Name Time Before Time After Comments  Abciximab Reopro 15 days 2 hours   Alteplase Activase 10 days 10 days   Apixaban Eliquis 3 days 6 hours   Aspirin > 325 mg Goody Powders/Excedrin 11 days  (Usually not stopped)  Aspirin ? 81 mg  7 days  (Usually not stopped)  Cholesterol Medication Lipitor 4 days    Cilostazol Pletal 3 days 5 hours   Clopidogrel Plavix 7-10 days 2 hours   Dabigatran Pradaxa 5 days 6 hours   Delteparin Fragmin 24 hours 4 hours   Dipyridamole + ASA Aggrenox 11days 2 hours   Enoxaparin  Lovenox 24 hours 4 hours   Eptifibatide Integrillin 8 hours 2 hours   Fish oil  4 days    Fondaparinux  Arixtra 72 hours 12 hours   Garlic supplements  7 days    Ginkgo biloba  36 hours    Ginseng  24 hours    Heparin (IV)  4 hours 2 hours   Heparin ([place])  12 hours 2 hours   Hydroxychloroquine Plaquenil 11 days    LMW Heparin  24 hours    LMWH  24 hours    NSAIDs  3 days  (Usually not stopped)  Prasugrel Effient 7-10 days 6 hours   Reteplase Retavase 10 days 10 days   Rivaroxaban Xarelto 3 days 6 hours   Streptokinase Streptase 10 days 10 days   Tenecteplase TNKase 10 days 10 days   Thrombolytics  10 days  10 days Avoid x 10 days after inj.  Ticagrelor Brilinta 5-7 days 6 hours   Ticlodipine Ticlid 10-14 days 2 hours   Tinzaparin Innohep 24 hours 4 hours   Tirofiban Aggrastat 8 hours 2 hours   Vitamin E  4 days    Warfarin Coumadin 5 days 2 hours   ____________________________________________________________________________________________  ____________________________________________________________________________________________  General Risks and Possible  Complications  Patient Responsibilities: It is important that you read this as it is part of your informed consent. It is our duty to inform you of the risks and possible complications associated with treatments offered to you. It is your responsibility as a patient to read this and to ask questions about anything that is not clear or that you believe was not covered in this document.  Patient's Rights: You have the right to refuse treatment. You also have the right to change your mind, even after initially having agreed to have the treatment done. However, under this last option, if you wait until the last second to change your mind, you may be charged for the materials used up to that point.  Introduction: Medicine is not an Chief Strategy Officer. Everything in Medicine, including the lack of treatment(s), carries the potential for danger, harm, or loss (which is by definition: Risk). In Medicine, a complication is a secondary problem, condition, or disease that can aggravate an already existing one. All treatments carry the risk of possible complications. The fact that a side effects or complications occurs, does not imply that the treatment was conducted incorrectly. It must be clearly understood that these can happen even when everything is done following the highest safety standards.  No treatment: You can choose not to proceed with the proposed treatment alternative. The "PRO(s)" would include: avoiding the  risk of complications associated with the therapy. The "CON(s)" would include: not getting any of the treatment benefits. These benefits fall under one of three categories: diagnostic; therapeutic; and/or palliative. Diagnostic benefits include: getting information which can ultimately lead to improvement of the disease or symptom(s). Therapeutic benefits are those associated with the successful treatment of the disease. Finally, palliative benefits are those related to the decrease of the primary  symptoms, without necessarily curing the condition (example: decreasing the pain from a flare-up of a chronic condition, such as incurable terminal cancer).  General Risks and Complications: These are associated to most interventional treatments. They can occur alone, or in combination. They fall under one of the following six (6) categories: no benefit or worsening of symptoms; bleeding; infection; nerve damage; allergic reactions; and/or death. 1. No benefits or worsening of symptoms: In Medicine there are no guarantees, only probabilities. No healthcare provider can ever guarantee that a medical treatment will work, they can only state the probability that it may. Furthermore, there is always the possibility that the condition may worsen, either directly, or indirectly, as a consequence of the treatment. 2. Bleeding: This is more common if the patient is taking a blood thinner, either prescription or over the counter (example: Goody Powders, Fish oil, Aspirin, Garlic, etc.), or if suffering a condition associated with impaired coagulation (example: Hemophilia, cirrhosis of the liver, low platelet counts, etc.). However, even if you do not have one on these, it can still happen. If you have any of these conditions, or take one of these drugs, make sure to notify your treating physician. 3. Infection: This is more common in patients with a compromised immune system, either due to disease (example: diabetes, cancer, human immunodeficiency virus [HIV], etc.), or due to medications or treatments (example: therapies used to treat cancer and rheumatological diseases). However, even if you do not have one on these, it can still happen. If you have any of these conditions, or take one of these drugs, make sure to notify your treating physician. 4. Nerve Damage: This is more common when the treatment is an invasive one, but it can also happen with the use of medications, such as those used in the treatment of cancer.  The damage can occur to small secondary nerves, or to large primary ones, such as those in the spinal cord and brain. This damage may be temporary or permanent and it may lead to impairments that can range from temporary numbness to permanent paralysis and/or brain death. 5. Allergic Reactions: Any time a substance or material comes in contact with our body, there is the possibility of an allergic reaction. These can range from a mild skin rash (contact dermatitis) to a severe systemic reaction (anaphylactic reaction), which can result in death. 6. Death: In general, any medical intervention can result in death, most of the time due to an unforeseen complication. ____________________________________________________________________________________________

## 2018-07-01 ENCOUNTER — Encounter: Payer: Self-pay | Admitting: Pain Medicine

## 2018-07-01 ENCOUNTER — Ambulatory Visit: Payer: Medicare Other | Attending: Pain Medicine | Admitting: Pain Medicine

## 2018-07-01 ENCOUNTER — Other Ambulatory Visit: Payer: Self-pay

## 2018-07-01 VITALS — BP 154/79 | HR 87 | Temp 97.9°F | Ht 64.0 in | Wt 196.0 lb

## 2018-07-01 DIAGNOSIS — M5136 Other intervertebral disc degeneration, lumbar region: Secondary | ICD-10-CM | POA: Insufficient documentation

## 2018-07-01 DIAGNOSIS — M4316 Spondylolisthesis, lumbar region: Secondary | ICD-10-CM | POA: Insufficient documentation

## 2018-07-01 DIAGNOSIS — M5441 Lumbago with sciatica, right side: Secondary | ICD-10-CM | POA: Insufficient documentation

## 2018-07-01 DIAGNOSIS — M5417 Radiculopathy, lumbosacral region: Secondary | ICD-10-CM | POA: Insufficient documentation

## 2018-07-01 DIAGNOSIS — Z79899 Other long term (current) drug therapy: Secondary | ICD-10-CM | POA: Insufficient documentation

## 2018-07-01 DIAGNOSIS — M5116 Intervertebral disc disorders with radiculopathy, lumbar region: Secondary | ICD-10-CM | POA: Insufficient documentation

## 2018-07-01 DIAGNOSIS — Z886 Allergy status to analgesic agent status: Secondary | ICD-10-CM | POA: Diagnosis not present

## 2018-07-01 DIAGNOSIS — M79642 Pain in left hand: Secondary | ICD-10-CM | POA: Insufficient documentation

## 2018-07-01 DIAGNOSIS — I10 Essential (primary) hypertension: Secondary | ICD-10-CM | POA: Insufficient documentation

## 2018-07-01 DIAGNOSIS — M51369 Other intervertebral disc degeneration, lumbar region without mention of lumbar back pain or lower extremity pain: Secondary | ICD-10-CM | POA: Insufficient documentation

## 2018-07-01 DIAGNOSIS — Z7982 Long term (current) use of aspirin: Secondary | ICD-10-CM | POA: Insufficient documentation

## 2018-07-01 DIAGNOSIS — Z96641 Presence of right artificial hip joint: Secondary | ICD-10-CM | POA: Insufficient documentation

## 2018-07-01 DIAGNOSIS — M5117 Intervertebral disc disorders with radiculopathy, lumbosacral region: Secondary | ICD-10-CM | POA: Diagnosis not present

## 2018-07-01 DIAGNOSIS — M79604 Pain in right leg: Secondary | ICD-10-CM | POA: Insufficient documentation

## 2018-07-01 DIAGNOSIS — Z888 Allergy status to other drugs, medicaments and biological substances status: Secondary | ICD-10-CM | POA: Insufficient documentation

## 2018-07-01 DIAGNOSIS — M4310 Spondylolisthesis, site unspecified: Secondary | ICD-10-CM | POA: Insufficient documentation

## 2018-07-01 DIAGNOSIS — M1611 Unilateral primary osteoarthritis, right hip: Secondary | ICD-10-CM | POA: Diagnosis not present

## 2018-07-01 DIAGNOSIS — D472 Monoclonal gammopathy: Secondary | ICD-10-CM | POA: Diagnosis not present

## 2018-07-01 DIAGNOSIS — M48061 Spinal stenosis, lumbar region without neurogenic claudication: Secondary | ICD-10-CM | POA: Diagnosis not present

## 2018-07-01 DIAGNOSIS — M4726 Other spondylosis with radiculopathy, lumbar region: Secondary | ICD-10-CM | POA: Diagnosis not present

## 2018-07-01 DIAGNOSIS — G479 Sleep disorder, unspecified: Secondary | ICD-10-CM | POA: Diagnosis not present

## 2018-07-01 DIAGNOSIS — Z8673 Personal history of transient ischemic attack (TIA), and cerebral infarction without residual deficits: Secondary | ICD-10-CM | POA: Diagnosis not present

## 2018-07-01 DIAGNOSIS — G8929 Other chronic pain: Secondary | ICD-10-CM | POA: Insufficient documentation

## 2018-07-01 DIAGNOSIS — E119 Type 2 diabetes mellitus without complications: Secondary | ICD-10-CM | POA: Diagnosis not present

## 2018-07-01 DIAGNOSIS — M5416 Radiculopathy, lumbar region: Secondary | ICD-10-CM | POA: Diagnosis not present

## 2018-07-01 DIAGNOSIS — R937 Abnormal findings on diagnostic imaging of other parts of musculoskeletal system: Secondary | ICD-10-CM | POA: Insufficient documentation

## 2018-07-01 DIAGNOSIS — M47816 Spondylosis without myelopathy or radiculopathy, lumbar region: Secondary | ICD-10-CM | POA: Insufficient documentation

## 2018-07-09 ENCOUNTER — Encounter: Payer: Self-pay | Admitting: Pain Medicine

## 2018-07-09 ENCOUNTER — Other Ambulatory Visit: Payer: Self-pay

## 2018-07-09 ENCOUNTER — Ambulatory Visit (HOSPITAL_BASED_OUTPATIENT_CLINIC_OR_DEPARTMENT_OTHER): Payer: Medicare Other | Admitting: Pain Medicine

## 2018-07-09 ENCOUNTER — Ambulatory Visit
Admission: RE | Admit: 2018-07-09 | Discharge: 2018-07-09 | Disposition: A | Discharge status: Home / Self Care | Payer: Medicare Other | Source: Ambulatory Visit | Attending: Pain Medicine | Admitting: Pain Medicine

## 2018-07-09 VITALS — BP 118/76 | HR 88 | Temp 98.3°F | Resp 20 | Ht 64.0 in | Wt 194.0 lb

## 2018-07-09 DIAGNOSIS — M79604 Pain in right leg: Secondary | ICD-10-CM | POA: Diagnosis present

## 2018-07-09 DIAGNOSIS — M5116 Intervertebral disc disorders with radiculopathy, lumbar region: Secondary | ICD-10-CM | POA: Insufficient documentation

## 2018-07-09 DIAGNOSIS — M4316 Spondylolisthesis, lumbar region: Secondary | ICD-10-CM | POA: Diagnosis not present

## 2018-07-09 DIAGNOSIS — M4726 Other spondylosis with radiculopathy, lumbar region: Secondary | ICD-10-CM | POA: Diagnosis not present

## 2018-07-09 DIAGNOSIS — G8929 Other chronic pain: Secondary | ICD-10-CM

## 2018-07-09 DIAGNOSIS — M48061 Spinal stenosis, lumbar region without neurogenic claudication: Secondary | ICD-10-CM | POA: Diagnosis not present

## 2018-07-09 DIAGNOSIS — M4310 Spondylolisthesis, site unspecified: Secondary | ICD-10-CM

## 2018-07-09 DIAGNOSIS — R937 Abnormal findings on diagnostic imaging of other parts of musculoskeletal system: Secondary | ICD-10-CM

## 2018-07-09 DIAGNOSIS — M5136 Other intervertebral disc degeneration, lumbar region: Secondary | ICD-10-CM | POA: Diagnosis not present

## 2018-07-09 DIAGNOSIS — M5417 Radiculopathy, lumbosacral region: Secondary | ICD-10-CM

## 2018-07-09 DIAGNOSIS — M51369 Other intervertebral disc degeneration, lumbar region without mention of lumbar back pain or lower extremity pain: Secondary | ICD-10-CM

## 2018-07-09 DIAGNOSIS — M5416 Radiculopathy, lumbar region: Secondary | ICD-10-CM

## 2018-07-09 MED ORDER — LACTATED RINGERS IV SOLN: 1000.0000 mL | Freq: Once | INTRAVENOUS | Status: DC

## 2018-07-09 MED ORDER — DEXAMETHASONE SODIUM PHOSPHATE 10 MG/ML IJ SOLN
10.0000 mg | Freq: Once | INTRAMUSCULAR | Status: AC
  Administered 2018-07-09: 10 mg
  Filled 2018-07-09: qty 1

## 2018-07-09 MED ORDER — FENTANYL CITRATE (PF) 100 MCG/2ML IJ SOLN: 25.0000 ug | INTRAMUSCULAR | Status: DC | PRN

## 2018-07-09 MED ORDER — SODIUM CHLORIDE 0.9% FLUSH
2.0000 mL | Freq: Once | INTRAVENOUS | Status: AC
  Administered 2018-07-09: 2 mL

## 2018-07-09 MED ORDER — TRIAMCINOLONE ACETONIDE 40 MG/ML IJ SUSP
40.0000 mg | Freq: Once | INTRAMUSCULAR | Status: AC
  Administered 2018-07-09: 40 mg
  Filled 2018-07-09: qty 1

## 2018-07-09 MED ORDER — SODIUM CHLORIDE 0.9% FLUSH
1.0000 mL | Freq: Once | INTRAVENOUS | Status: AC
  Administered 2018-07-09: 1 mL

## 2018-07-09 MED ORDER — SODIUM CHLORIDE 0.9 % IJ SOLN
INTRAMUSCULAR | Status: AC
  Filled 2018-07-09: qty 10

## 2018-07-09 MED ORDER — ROPIVACAINE HCL 2 MG/ML IJ SOLN
2.0000 mL | Freq: Once | INTRAMUSCULAR | Status: DC
  Filled 2018-07-09: qty 10

## 2018-07-09 MED ORDER — ROPIVACAINE HCL 2 MG/ML IJ SOLN
1.0000 mL | Freq: Once | INTRAMUSCULAR | Status: DC
  Filled 2018-07-09: qty 10

## 2018-07-09 MED ORDER — LIDOCAINE HCL 2 % IJ SOLN
20.0000 mL | Freq: Once | INTRAMUSCULAR | Status: AC
  Administered 2018-07-09: 400 mg
  Filled 2018-07-09: qty 40

## 2018-07-09 MED ORDER — IOPAMIDOL (ISOVUE-M 200) INJECTION 41%
10.0000 mL | Freq: Once | INTRAMUSCULAR | Status: AC
  Administered 2018-07-09: 10 mL via EPIDURAL
  Filled 2018-07-09: qty 10

## 2018-07-09 MED ORDER — MIDAZOLAM HCL 5 MG/5ML IJ SOLN: 1.0000 mg | INTRAMUSCULAR | Status: DC | PRN

## 2018-07-09 NOTE — Progress Notes (Signed)
Patient's Name: [name] [name]  MRN: [id number]  Referring Provider: Jodi Marble, MD  DOB: 1945-11-20  PCP: Jodi Marble, MD  DOS: 07/09/2018  Note by: Gaspar Cola, MD  Service setting: Ambulatory outpatient  Specialty: Interventional Pain Management  Patient type: Established  Location: [facility] (AMB) Pain Management Facility  Visit type: Interventional Procedure   Primary Reason for Visit: Interventional Pain Management Treatment. CC: Leg Pain (right)  Procedure:          Anesthesia, Analgesia, Anxiolysis:  Type: Diagnostic Inter-Laminar Epidural Steroid Injection  #1  Region: Lumbar Level: L4-5 Level. Laterality: Right-Sided Paramedial   NOTE: Today we had schedule the patient to come in for an L5 transforaminal epidural steroid injection, which we attempted to do, but we were unable to complete due to the fact that the patient has hypertrophy of the L5-S1 facet joint, blocking the path to the region needed for the transforaminal/selective nerve block.  This particular procedure is not possible at this site, on this patient, because of the degenerative process of her lumbar facet.  Type: Local Anesthesia Indication(s): Analgesia         Route: Infiltration ([place]/IM) IV Access: Declined Sedation: Declined  Local Anesthetic: Lidocaine 1-2%  Position: Prone with head of the table was raised to facilitate breathing.   Indications: 1. DDD (degenerative disc disease), lumbar   2. Lumbar spondylosis with radiculopathy (Right)   3. Lumbosacral radiculopathy at L5 (Right)   4. Chronic lumbar radicular pain (L5 dermatome) (Right)   5. 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 Lumbar Grade 1 Anterolisthesis of L4 over L5   7. 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 Chronic lower extremity pain (Primary Area of Pain) (Right)   9. Abnormal MRI, lumbar spine    Pain Score: Pre-procedure: 10-Worst pain  ever/10 Post-procedure: 0-No pain/10  Pre-op Assessment:  [name] [name] is a 72 y.o. (year old), female patient, seen today for interventional treatment. 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 [name] [name] has a current medication list which includes the following prescription(s): acarbose, acetaminophen, aspirin, atorvastatin, glipizide, lisinopril-hydrochlorothiazide, pioglitazone, and sitagliptin, and the following Facility-Administered Medications: ropivacaine (pf) 2 mg/ml (0.2%) and ropivacaine (pf) 2 mg/ml (0.2%). Her primarily concern today is the Leg Pain (right)  Initial Vital Signs:  Pulse/HCG Rate: 94  Temp: 98.3 F (36.8 C) Resp: 15 BP: 133/78 SpO2: 99 %  BMI: Estimated body mass index is 33.3 kg/m as calculated from the following:   Height as of this encounter: 5\' 4"  (1.626 m).   Weight as of this encounter: 194 lb (88 kg).  Risk Assessment: Allergies: Reviewed. She is allergic to cortisone; gabapentin; and nsaids.  Allergy Precautions: None required.  Further questioning revealed that the patient is really not allergic to cortisone.  What she has today is that when she takes any kind of steroids her blood sugar goes up.  The patient was informed that this would happen with the triamcinolone as well.  She indicated that she was okay with it. Coagulopathies: Reviewed. None identified.  Blood-thinner therapy: None at this time Active Infection(s): Reviewed. None identified. [name] is afebrile  Site Confirmation: [name] [name] was asked to confirm the procedure and laterality before marking the site Procedure checklist: Completed Consent: Before the procedure and under the influence of no sedative(s), amnesic(s), or anxiolytics, the patient was informed  of the treatment options, risks and possible complications. To fulfill our ethical and legal obligations, as recommended  by the American Medical Association's Code of Ethics, I have informed the patient of my clinical impression; the nature and purpose of the treatment or procedure; the risks, benefits, and possible complications of the intervention; the alternatives, including doing nothing; the risk(s) and benefit(s) of the alternative treatment(s) or procedure(s); and the risk(s) and benefit(s) of doing nothing. The patient was provided information about the general risks and possible complications associated with the procedure. These may include, but are not limited to: failure to achieve desired goals, infection, bleeding, organ or nerve damage, allergic reactions, paralysis, and death. In addition, the patient was informed of those risks and complications associated to Spine-related procedures, such as failure to decrease pain; infection (i.e.: Meningitis, epidural or intraspinal abscess); bleeding (i.e.: epidural hematoma, subarachnoid hemorrhage, or any other type of intraspinal or peri-dural bleeding); organ or nerve damage (i.e.: Any type of peripheral nerve, nerve root, or spinal cord injury) with subsequent damage to sensory, motor, and/or autonomic systems, resulting in permanent pain, numbness, and/or weakness of one or several areas of the body; allergic reactions; (i.e.: anaphylactic reaction); and/or death. Furthermore, the patient was informed of those risks and complications associated with the medications. These include, but are not limited to: allergic reactions (i.e.: anaphylactic or anaphylactoid reaction(s)); adrenal axis suppression; blood sugar elevation that in diabetics may result in ketoacidosis or comma; water retention that in patients with history of congestive heart failure may result in shortness of breath, pulmonary edema, and decompensation with resultant heart failure; weight gain; swelling or edema; medication-induced neural toxicity; particulate matter embolism and blood vessel occlusion with  resultant organ, and/or nervous system infarction; and/or aseptic necrosis of one or more joints. Finally, the patient was informed that Medicine is not an exact science; therefore, there is also the possibility of unforeseen or unpredictable risks and/or possible complications that may result in a catastrophic outcome. The patient indicated having understood very clearly. We have given the patient no guarantees and we have made no promises. Enough time was given to the patient to ask questions, all of which were answered to the patient's satisfaction. Ms. Gillson has indicated that she wanted to continue with the procedure. Attestation: I, the ordering provider, attest that I have discussed with the patient the benefits, risks, side-effects, alternatives, likelihood of achieving goals, and potential problems during recovery for the procedure that I have provided informed consent. Date  Time: 07/09/2018  8:25 AM  Pre-Procedure Preparation:  Monitoring: As per clinic protocol. Respiration, ETCO2, SpO2, BP, heart rate and rhythm monitor placed and checked for adequate function Safety Precautions: Patient was assessed for positional comfort and pressure points before starting the procedure. Time-out: I initiated and conducted the "Time-out" before starting the procedure, as per protocol. The patient was asked to participate by confirming the accuracy of the "Time Out" information. Verification of the correct person, site, and procedure were performed and confirmed by me, the nursing staff, and the patient. "Time-out" conducted as per Joint Commission's Universal Protocol (UP.01.01.01). Time: 0931  Description of Procedure:          Target Area: The interlaminar space, initially targeting the lower laminar border of the superior vertebral body. Approach: Paramedial approach. Area Prepped: Entire Posterior Lumbar Region Prepping solution: ChloraPrep (2% chlorhexidine gluconate and 70% isopropyl  alcohol) Safety Precautions: Aspiration looking for blood return was conducted prior to all injections. At no point did we inject any  substances, as a needle was being advanced. No attempts were made at seeking any paresthesias. Safe injection practices and needle disposal techniques used. Medications properly checked for expiration dates. SDV (single dose vial) medications used. Description of the Procedure: Protocol guidelines were followed. The procedure needle was introduced through the skin, ipsilateral to the reported pain, and advanced to the target area. Bone was contacted and the needle walked caudad, until the lamina was cleared. The epidural space was identified using "loss-of-resistance technique" with 2-3 ml of PF-NaCl (0.9% NSS), in a 5cc LOR glass syringe.  Vitals:   07/09/18 0920 07/09/18 0930 07/09/18 0940 07/09/18 0950  BP: 123/78 129/74 112/74 118/76  Pulse: 99 86 90 88  Resp: 15 16 18 20   Temp:      SpO2: 99% 100% 99% 100%  Weight:      Height:        Start Time: 0931 hrs. End Time: 0947 hrs.  Materials:  Needle(s) Type: Epidural needle Gauge: 17G Length: 3.5-in Medication(s): Please see orders for medications and dosing details.  Imaging Guidance (Spinal):          Type of Imaging Technique: Fluoroscopy Guidance (Spinal) Indication(s): Assistance in needle guidance and placement for procedures requiring needle placement in or near specific anatomical locations not easily accessible without such assistance. Exposure Time: Please see nurses notes. Contrast: Before injecting any contrast, we confirmed that the patient did not have an allergy to iodine, shellfish, or radiological contrast. Once satisfactory needle placement was completed at the desired level, radiological contrast was injected. Contrast injected under live fluoroscopy. No contrast complications. See chart for type and volume of contrast used. Fluoroscopic Guidance: I was personally present during the use  of fluoroscopy. "Tunnel Vision Technique" used to obtain the best possible view of the target area. Parallax error corrected before commencing the procedure. "Direction-depth-direction" technique used to introduce the needle under continuous pulsed fluoroscopy. Once target was reached, antero-posterior, oblique, and lateral fluoroscopic projection used confirm needle placement in all planes. Images permanently stored in EMR. Interpretation: I personally interpreted the imaging intraoperatively. Adequate needle placement confirmed in multiple planes. Appropriate spread of contrast into desired area was observed. No evidence of afferent or efferent intravascular uptake. No intrathecal or subarachnoid spread observed. Permanent images saved into the patient's record.  Antibiotic Prophylaxis:   Anti-infectives (From admission, onward)   None     Indication(s): None identified  Post-operative Assessment:  Post-procedure Vital Signs:  Pulse/HCG Rate: 88  Temp: 98.3 F (36.8 C) Resp: 20 BP: 118/76 SpO2: 100 %  EBL: None  Complications: No immediate post-treatment complications observed by team, or reported by patient.  Note: The patient tolerated the entire procedure well. A repeat set of vitals were taken after the procedure and the patient was kept under observation following institutional policy, for this type of procedure. Post-procedural neurological assessment was performed, showing return to baseline, prior to discharge. The patient was provided with post-procedure discharge instructions, including a section on how to identify potential problems. Should any problems arise concerning this procedure, the patient was given instructions to immediately contact us, at any time, without hesitation. In any case, we plan to contact the patient by telephone for a follow-up status report regarding this interventional procedure.  Comments:  No additional relevant information.  Plan of Care    Imaging Orders     DG C-Arm 1-60 Min-No Report  Procedure Orders     Lumbar Epidural Injection     Lumbar Transforaminal Epidural  Medications  ordered for procedure: Meds ordered this encounter  Medications  . iopamidol (ISOVUE-M) 41 % intrathecal injection 10 mL    Must be Myelogram-compatible. If not available, you may substitute with a water-soluble, non-ionic, hypoallergenic, myelogram-compatible radiological contrast medium.  Marland Kitchen lidocaine (XYLOCAINE) 2 % (with pres) injection 400 mg  . DISCONTD: midazolam (VERSED) 5 MG/5ML injection 1-2 mg    Make sure Flumazenil is available in the pyxis when using this medication. If oversedation occurs, administer 0.2 mg IV over 15 sec. If after 45 sec no response, administer 0.2 mg again over 1 min; may repeat at 1 min intervals; not to exceed 4 doses (1 mg)  . DISCONTD: fentaNYL (SUBLIMAZE) injection 25-50 mcg    Make sure Narcan is available in the pyxis when using this medication. In the event of respiratory depression (RR< 8/min): Titrate NARCAN (naloxone) in increments of 0.1 to 0.2 mg IV at 2-3 minute intervals, until desired degree of reversal.  . DISCONTD: lactated ringers infusion 1,000 mL  . sodium chloride flush (NS) 0.9 % injection 1 mL  . ropivacaine (PF) 2 mg/mL (0.2%) (NAROPIN) injection 1 mL  . dexamethasone (DECADRON) injection 10 mg  . sodium chloride flush (NS) 0.9 % injection 2 mL  . ropivacaine (PF) 2 mg/mL (0.2%) (NAROPIN) injection 2 mL  . triamcinolone acetonide (KENALOG-40) injection 40 mg   Medications administered: We administered iopamidol, lidocaine, sodium chloride flush, dexamethasone, sodium chloride flush, and triamcinolone acetonide.  See the medical record for exact dosing, route, and time of administration.  Disposition: Discharge home  Discharge Date & Time: 07/09/2018; 0954 hrs.   Physician-requested Follow-up: Return for post-procedure eval (2 wks), w/ Dr. Dossie Arbour.  Future Appointments  Date Time  Provider [place]  07/27/2018  8:15 AM Milinda Pointer, MD [facility] None   Primary Care Physician: Jodi Marble, MD Location: [facility] Note by: Gaspar Cola, MD Date: 07/09/2018; Time: 10:04 AM  Disclaimer:  Medicine is not an exact science. The only guarantee in medicine is that nothing is guaranteed. It is important to note that the decision to proceed with this intervention was based on the information collected from the patient. The Data and conclusions were drawn from the patient's questionnaire, the interview, and the physical examination. Because the information was provided in large part by the patient, it cannot be guaranteed that it has not been purposely or unconsciously manipulated. Every effort has been made to obtain as much relevant data as possible for this evaluation. It is important to note that the conclusions that lead to this procedure are derived in large part from the available data. Always take into account that the treatment will also be dependent on availability of resources and existing treatment guidelines, considered by other Pain Management Practitioners as being common knowledge and practice, at the time of the intervention. For Medico-Legal purposes, it is also important to point out that variation in procedural techniques and pharmacological choices are the acceptable norm. The indications, contraindications, technique, and results of the above procedure should only be interpreted and judged by a Board-Certified Interventional Pain Specialist with extensive familiarity and expertise in the same exact procedure and technique.

## 2018-07-09 NOTE — Patient Instructions (Addendum)
____________________________________________________________________________________________  Post-Procedure Discharge Instructions  Instructions:  Apply ice: Fill a plastic sandwich bag with crushed ice. Cover it with a small towel and apply to injection site. Apply for 15 minutes then remove x 15 minutes. Repeat sequence on day of procedure, until you go to bed. The purpose is to minimize swelling and discomfort after procedure.  Apply heat: Apply heat to procedure site starting the day following the procedure. The purpose is to treat any soreness and discomfort from the procedure.  Food intake: Start with clear liquids (like water) and advance to regular food, as tolerated.   Physical activities: Keep activities to a minimum for the first 8 hours after the procedure.   Driving: If you have received any sedation, you are not allowed to drive for 24 hours after your procedure.  Blood thinner: Restart your blood thinner 6 hours after your procedure. (Only for those taking blood thinners)  Insulin: As soon as you can eat, you may resume your normal dosing schedule. (Only for those taking insulin)  Infection prevention: Keep procedure site clean and dry.  Post-procedure Pain Diary: Extremely important that this be done correctly and accurately. Recorded information will be used to determine the next step in treatment.  Pain evaluated is that of treated area only. Do not include pain from an untreated area.  Complete every hour, on the hour, for the initial 8 hours. Set an alarm to help you do this part accurately.  Do not go to sleep and have it completed later. It will not be accurate.  Follow-up appointment: Keep your follow-up appointment after the procedure. Usually 2 weeks for most procedures. (6 weeks in the case of radiofrequency.) Bring you pain diary.   Expect:  From numbing medicine (AKA: Local Anesthetics): Numbness or decrease in pain.  Onset: Full effect within 15  minutes of injected.  Duration: It will depend on the type of local anesthetic used. On the average, 1 to 8 hours.   From steroids: Decrease in swelling or inflammation. Once inflammation is improved, relief of the pain will follow.  Onset of benefits: Depends on the amount of swelling present. The more swelling, the longer it will take for the benefits to be seen. In some cases, up to 10 days.  Duration: Steroids will stay in the system x 2 weeks. Duration of benefits will depend on multiple posibilities including persistent irritating factors.  Occasional side-effects: Facial flushing, cramps (if present, drink Gatorade and take over-the-counter Magnesium 450-500 mg once to twice a day).  From procedure: Some discomfort is to be expected once the numbing medicine wears off. This should be minimal if ice and heat are applied as instructed.  Call if:  You experience numbness and weakness that gets worse with time, as opposed to wearing off.  New onset bowel or bladder incontinence. (This applies to Spinal procedures only)  Emergency Numbers:  [place] business hours (Monday - Thursday, 8:00 AM - 4:00 PM) (Friday, 9:00 AM - 12:00 Noon): [phone]  After hours: [phone] ____________________________________________________________________________________________   ____________________________________________________________________________________________  Pain Scale  Introduction: The pain score used by this practice is the Verbal Numerical Rating Scale (VNRS-11). This is an 11-point scale. It is for adults and children 10 years or older. There are significant differences in how the pain score is reported, used, and applied. Forget everything you learned in the past and learn this scoring system.  General Information: The scale should reflect your current level of pain. Unless you are specifically asked   for the level of your worst pain, or your average pain. If you are asked  for one of these two, then it should be understood that it is over the past 24 hours.  Basic Activities of Daily Living (ADL): Personal hygiene, dressing, eating, transferring, and using restroom.  Instructions: Most patients tend to report their level of pain as a combination of two factors, their physical pain and their psychosocial pain. This last one is also known as "suffering" and it is reflection of how physical pain affects you socially and psychologically. From now on, report them separately. From this point on, when asked to report your pain level, report only your physical pain. Use the following table for reference.  Pain Clinic Pain Levels (0-5/10)  Pain Level Score  Description  No Pain 0   Mild pain 1 Nagging, annoying, but does not interfere with basic activities of daily living (ADL). Patients are able to eat, bathe, get dressed, toileting (being able to get on and off the toilet and perform personal hygiene functions), transfer (move in and out of bed or a chair without assistance), and maintain continence (able to control bladder and bowel functions). Blood pressure and heart rate are unaffected. A normal heart rate for a healthy adult ranges from 60 to 100 bpm (beats per minute).   Mild to moderate pain 2 Noticeable and distracting. Impossible to hide from other people. More frequent flare-ups. Still possible to adapt and function close to normal. It can be very annoying and may have occasional stronger flare-ups. With discipline, patients may get used to it and adapt.   Moderate pain 3 Interferes significantly with activities of daily living (ADL). It becomes difficult to feed, bathe, get dressed, get on and off the toilet or to perform personal hygiene functions. Difficult to get in and out of bed or a chair without assistance. Very distracting. With effort, it can be ignored when deeply involved in activities.   Moderately severe pain 4 Impossible to ignore for more than a few  minutes. With effort, patients may still be able to manage work or participate in some social activities. Very difficult to concentrate. Signs of autonomic nervous system discharge are evident: dilated pupils (mydriasis); mild sweating (diaphoresis); sleep interference. Heart rate becomes elevated (>115 bpm). Diastolic blood pressure (lower number) rises above 100 mmHg. Patients find relief in laying down and not moving.   Severe pain 5 Intense and extremely unpleasant. Associated with frowning face and frequent crying. Pain overwhelms the senses.  Ability to do any activity or maintain social relationships becomes significantly limited. Conversation becomes difficult. Pacing back and forth is common, as getting into a comfortable position is nearly impossible. Pain wakes you up from deep sleep. Physical signs will be obvious: pupillary dilation; increased sweating; goosebumps; brisk reflexes; cold, clammy hands and feet; nausea, vomiting or dry heaves; loss of appetite; significant sleep disturbance with inability to fall asleep or to remain asleep. When persistent, significant weight loss is observed due to the complete loss of appetite and sleep deprivation.  Blood pressure and heart rate becomes significantly elevated. Caution: If elevated blood pressure triggers a pounding headache associated with blurred vision, then the patient should immediately seek attention at an urgent or emergency care unit, as these may be signs of an impending stroke.    Emergency Department Pain Levels (6-10/10)  Emergency Room Pain 6 Severely limiting. Requires emergency care and should not be seen or managed at an outpatient pain management facility. Communication becomes difficult  and requires great effort. Assistance to reach the emergency department may be required. Facial flushing and profuse sweating along with potentially dangerous increases in heart rate and blood pressure will be evident.   Distressing pain 7  Self-care is very difficult. Assistance is required to transport, or use restroom. Assistance to reach the emergency department will be required. Tasks requiring coordination, such as bathing and getting dressed become very difficult.   Disabling pain 8 Self-care is no longer possible. At this level, pain is disabling. The individual is unable to do even the most "basic" activities such as walking, eating, bathing, dressing, transferring to a bed, or toileting. Fine motor skills are lost. It is difficult to think clearly.   Incapacitating pain 9 Pain becomes incapacitating. Thought processing is no longer possible. Difficult to remember your own name. Control of movement and coordination are lost.   The worst pain imaginable 10 At this level, most patients pass out from pain. When this level is reached, collapse of the autonomic nervous system occurs, leading to a sudden drop in blood pressure and heart rate. This in turn results in a temporary and dramatic drop in blood flow to the brain, leading to a loss of consciousness. Fainting is one of the body's self defense mechanisms. Passing out puts the brain in a calmed state and causes it to shut down for a while, in order to begin the healing process.    Summary: 1. Refer to this scale when providing [place] with your pain level. 2. Be accurate and careful when reporting your pain level. This will help with your care. 3. Over-reporting your pain level will lead to loss of credibility. 4. Even a level of 1/10 means that there is pain and will be treated at our facility. 5. High, inaccurate reporting will be documented as "Symptom Exaggeration", leading to loss of credibility and suspicions of possible secondary gains such as obtaining more narcotics, or wanting to appear disabled, for fraudulent reasons. 6. Only pain levels of 5 or below will be seen at our facility. 7. Pain levels of 6 and above will be sent to the Emergency Department and the appointment  cancelled. ____________________________________________________________________________________________   ____________________________________________________________________________________________  Post-Procedure Discharge Instructions  Instructions:  Apply ice: Fill a plastic sandwich bag with crushed ice. Cover it with a small towel and apply to injection site. Apply for 15 minutes then remove x 15 minutes. Repeat sequence on day of procedure, until you go to bed. The purpose is to minimize swelling and discomfort after procedure.  Apply heat: Apply heat to procedure site starting the day following the procedure. The purpose is to treat any soreness and discomfort from the procedure.  Food intake: Start with clear liquids (like water) and advance to regular food, as tolerated.   Physical activities: Keep activities to a minimum for the first 8 hours after the procedure.   Driving: If you have received any sedation, you are not allowed to drive for 24 hours after your procedure.  Blood thinner: Restart your blood thinner 6 hours after your procedure. (Only for those taking blood thinners)  Insulin: As soon as you can eat, you may resume your normal dosing schedule. (Only for those taking insulin)  Infection prevention: Keep procedure site clean and dry.  Post-procedure Pain Diary: Extremely important that this be done correctly and accurately. Recorded information will be used to determine the next step in treatment.  Pain evaluated is that of treated area only. Do not include pain from an  untreated area.  Complete every hour, on the hour, for the initial 8 hours. Set an alarm to help you do this part accurately.  Do not go to sleep and have it completed later. It will not be accurate.  Follow-up appointment: Keep your follow-up appointment after the procedure. Usually 2 weeks for most procedures. (6 weeks in the case of radiofrequency.) Bring you pain diary.   Expect:  From  numbing medicine (AKA: Local Anesthetics): Numbness or decrease in pain.  Onset: Full effect within 15 minutes of injected.  Duration: It will depend on the type of local anesthetic used. On the average, 1 to 8 hours.   From steroids: Decrease in swelling or inflammation. Once inflammation is improved, relief of the pain will follow.  Onset of benefits: Depends on the amount of swelling present. The more swelling, the longer it will take for the benefits to be seen. In some cases, up to 10 days.  Duration: Steroids will stay in the system x 2 weeks. Duration of benefits will depend on multiple posibilities including persistent irritating factors.  Occasional side-effects: Facial flushing, cramps (if present, drink Gatorade and take over-the-counter Magnesium 450-500 mg once to twice a day).  From procedure: Some discomfort is to be expected once the numbing medicine wears off. This should be minimal if ice and heat are applied as instructed.  Call if:  You experience numbness and weakness that gets worse with time, as opposed to wearing off.  New onset bowel or bladder incontinence. (This applies to Spinal procedures only)  Emergency Numbers:  [place] business hours (Monday - Thursday, 8:00 AM - 4:00 PM) (Friday, 9:00 AM - 12:00 Noon): [phone]  After hours: [phone] ____________________________________________________________________________________________

## 2018-07-09 NOTE — Progress Notes (Signed)
Safety precautions to be maintained throughout the outpatient stay will include: orient to surroundings, keep bed in low position, maintain call bell within reach at all times, provide assistance with transfer out of bed and ambulation.  

## 2018-07-10 ENCOUNTER — Telehealth: Payer: Self-pay

## 2018-07-10 NOTE — Telephone Encounter (Signed)
Post procedure phone call.   No answer.  

## 2018-07-15 ENCOUNTER — Telehealth: Payer: Self-pay | Admitting: Pain Medicine

## 2018-07-15 NOTE — Telephone Encounter (Signed)
Work note can be written for day of procedure and day after. Patient will accept this.

## 2018-07-15 NOTE — Telephone Encounter (Addendum)
Patient states she has to go back to work today and needs a written note from Dr. Dossie Arbour saying she can go back to work and why she has not been able to work for the last 30 days due to her leg pain and having procedure done on it. Says she is still hurting some but she needs to go back to work.  [phone]

## 2018-07-23 NOTE — Patient Instructions (Addendum)
____________________________________________________________________________________________  Pain Scale  Introduction: The pain score used by this practice is the Verbal Numerical Rating Scale (VNRS-11). This is an 11-point scale. It is for adults and children 10 years or older. There are significant differences in how the pain score is reported, used, and applied. Forget everything you learned in the past and learn this scoring system.  General Information: The scale should reflect your current level of pain. Unless you are specifically asked for the level of your worst pain, or your average pain. If you are asked for one of these two, then it should be understood that it is over the past 24 hours.  Basic Activities of Daily Living (ADL): Personal hygiene, dressing, eating, transferring, and using restroom.  Instructions: Most patients tend to report their level of pain as a combination of two factors, their physical pain and their psychosocial pain. This last one is also known as "suffering" and it is reflection of how physical pain affects you socially and psychologically. From now on, report them separately. From this point on, when asked to report your pain level, report only your physical pain. Use the following table for reference.  Pain Clinic Pain Levels (0-5/10)  Pain Level Score  Description  No Pain 0   Mild pain 1 Nagging, annoying, but does not interfere with basic activities of daily living (ADL). Patients are able to eat, bathe, get dressed, toileting (being able to get on and off the toilet and perform personal hygiene functions), transfer (move in and out of bed or a chair without assistance), and maintain continence (able to control bladder and bowel functions). Blood pressure and heart rate are unaffected. A normal heart rate for a healthy adult ranges from 60 to 100 bpm (beats per minute).   Mild to moderate pain 2 Noticeable and distracting. Impossible to hide from other  people. More frequent flare-ups. Still possible to adapt and function close to normal. It can be very annoying and may have occasional stronger flare-ups. With discipline, patients may get used to it and adapt.   Moderate pain 3 Interferes significantly with activities of daily living (ADL). It becomes difficult to feed, bathe, get dressed, get on and off the toilet or to perform personal hygiene functions. Difficult to get in and out of bed or a chair without assistance. Very distracting. With effort, it can be ignored when deeply involved in activities.   Moderately severe pain 4 Impossible to ignore for more than a few minutes. With effort, patients may still be able to manage work or participate in some social activities. Very difficult to concentrate. Signs of autonomic nervous system discharge are evident: dilated pupils (mydriasis); mild sweating (diaphoresis); sleep interference. Heart rate becomes elevated (>115 bpm). Diastolic blood pressure (lower number) rises above 100 mmHg. Patients find relief in laying down and not moving.   Severe pain 5 Intense and extremely unpleasant. Associated with frowning face and frequent crying. Pain overwhelms the senses.  Ability to do any activity or maintain social relationships becomes significantly limited. Conversation becomes difficult. Pacing back and forth is common, as getting into a comfortable position is nearly impossible. Pain wakes you up from deep sleep. Physical signs will be obvious: pupillary dilation; increased sweating; goosebumps; brisk reflexes; cold, clammy hands and feet; nausea, vomiting or dry heaves; loss of appetite; significant sleep disturbance with inability to fall asleep or to remain asleep. When persistent, significant weight loss is observed due to the complete loss of appetite and sleep deprivation.  Blood   pressure and heart rate becomes significantly elevated. Caution: If elevated blood pressure triggers a pounding headache  associated with blurred vision, then the patient should immediately seek attention at an urgent or emergency care unit, as these may be signs of an impending stroke.    Emergency Department Pain Levels (6-10/10)  Emergency Room Pain 6 Severely limiting. Requires emergency care and should not be seen or managed at an outpatient pain management facility. Communication becomes difficult and requires great effort. Assistance to reach the emergency department may be required. Facial flushing and profuse sweating along with potentially dangerous increases in heart rate and blood pressure will be evident.   Distressing pain 7 Self-care is very difficult. Assistance is required to transport, or use restroom. Assistance to reach the emergency department will be required. Tasks requiring coordination, such as bathing and getting dressed become very difficult.   Disabling pain 8 Self-care is no longer possible. At this level, pain is disabling. The individual is unable to do even the most "basic" activities such as walking, eating, bathing, dressing, transferring to a bed, or toileting. Fine motor skills are lost. It is difficult to think clearly.   Incapacitating pain 9 Pain becomes incapacitating. Thought processing is no longer possible. Difficult to remember your own name. Control of movement and coordination are lost.   The worst pain imaginable 10 At this level, most patients pass out from pain. When this level is reached, collapse of the autonomic nervous system occurs, leading to a sudden drop in blood pressure and heart rate. This in turn results in a temporary and dramatic drop in blood flow to the brain, leading to a loss of consciousness. Fainting is one of the body's self defense mechanisms. Passing out puts the brain in a calmed state and causes it to shut down for a while, in order to begin the healing process.    Summary: 1. Refer to this scale when providing us with your pain level. 2. Be  accurate and careful when reporting your pain level. This will help with your care. 3. Over-reporting your pain level will lead to loss of credibility. 4. Even a level of 1/10 means that there is pain and will be treated at our facility. 5. High, inaccurate reporting will be documented as "Symptom Exaggeration", leading to loss of credibility and suspicions of possible secondary gains such as obtaining more narcotics, or wanting to appear disabled, for fraudulent reasons. 6. Only pain levels of 5 or below will be seen at our facility. 7. Pain levels of 6 and above will be sent to the Emergency Department and the appointment cancelled. ____________________________________________________________________________________________   ____________________________________________________________________________________________  Preparing for Procedure with Sedation  Instructions: . Oral Intake: Do not eat or drink anything for at least 8 hours prior to your procedure. . Transportation: Public transportation is not allowed. Bring an adult driver. The driver must be physically present in our waiting room before any procedure can be started. . Physical Assistance: Bring an adult physically capable of assisting you, in the event you need help. This adult should keep you company at home for at least 6 hours after the procedure. . Blood Pressure Medicine: Take your blood pressure medicine with a sip of water the morning of the procedure. . Blood thinners: Notify our staff if you are taking any blood thinners. Depending on which one you take, there will be specific instructions on how and when to stop it. . Diabetics on insulin: Notify the staff so that you can be   scheduled 1st case in the morning. If your diabetes requires high dose insulin, take only  of your normal insulin dose the morning of the procedure and notify the staff that you have done so. . Preventing infections: Shower with an antibacterial soap  the morning of your procedure. . Build-up your immune system: Take 1000 mg of Vitamin C with every meal (3 times a day) the day prior to your procedure. Marland Kitchen Antibiotics: Inform the staff if you have a condition or reason that requires you to take antibiotics before dental procedures. . Pregnancy: If you are pregnant, call and cancel the procedure. . Sickness: If you have a cold, fever, or any active infections, call and cancel the procedure. . Arrival: You must be in the facility at least 30 minutes prior to your scheduled procedure. . Children: Do not bring children with you. . Dress appropriately: Bring dark clothing that you would not mind if they get stained. . Valuables: Do not bring any jewelry or valuables.  Procedure appointments are reserved for interventional treatments only. Marland Kitchen No Prescription Refills. . No medication changes will be discussed during procedure appointments. . No disability issues will be discussed.  Reasons to call and reschedule or cancel your procedure: (Following these recommendations will minimize the risk of a serious complication.) . Surgeries: Avoid having procedures within 2 weeks of any surgery. (Avoid for 2 weeks before or after any surgery). . Flu Shots: Avoid having procedures within 2 weeks of a flu shots or . (Avoid for 2 weeks before or after immunizations). . Barium: Avoid having a procedure within 7-10 days after having had a radiological study involving the use of radiological contrast. (Myelograms, Barium swallow or enema study). . Heart attacks: Avoid any elective procedures or surgeries for the initial 6 months after a "Myocardial Infarction" (Heart Attack). . Blood thinners: It is imperative that you stop these medications before procedures. Let us know if you if you take any blood thinner.  . Infection: Avoid procedures during or within two weeks of an infection (including chest colds or gastrointestinal problems). Symptoms associated with  infections include: Localized redness, fever, chills, night sweats or profuse sweating, burning sensation when voiding, cough, congestion, stuffiness, runny nose, sore throat, diarrhea, nausea, vomiting, cold or Flu symptoms, recent or current infections. It is specially important if the infection is over the area that we intend to treat. Marland Kitchen Heart and lung problems: Symptoms that may suggest an active cardiopulmonary problem include: cough, chest pain, breathing difficulties or shortness of breath, dizziness, ankle swelling, uncontrolled high or unusually low blood pressure, and/or palpitations. If you are experiencing any of these symptoms, cancel your procedure and contact your primary care physician for an evaluation.  Remember:  Regular Business hours are:  Monday to Thursday 8:00 AM to 4:00 PM  Provider's Schedule: Milinda Pointer, MD:  Procedure days: Tuesday and Thursday 7:30 AM to 4:00 PM  Gillis Santa, MD:  Procedure days: Monday and Wednesday 7:30 AM to 4:00 PM ____________________________________________________________________________________________   Pain Management Discharge Instructions  General Discharge Instructions :  If you need to reach your doctor call: Monday-Friday 8:00 am - 4:00 pm at [phone] or toll free [phone].  After clinic hours [phone] to have operator reach doctor.  Bring all of your medication bottles to all your appointments in the pain clinic.  To cancel or reschedule your appointment with Pain Management please remember to call 24 hours in advance to avoid a fee.  Refer to the educational materials which you have  been given on: General Risks, I had my Procedure. Discharge Instructions, Post Sedation.  Post Procedure Instructions:  The drugs you were given will stay in your system until tomorrow, so for the next 24 hours you should not drive, make any legal decisions or drink any alcoholic beverages.  You may eat anything you  prefer, but it is better to start with liquids then soups and crackers, and gradually work up to solid foods.  Please notify your doctor immediately if you have any unusual bleeding, trouble breathing or pain that is not related to your normal pain.  Depending on the type of procedure that was done, some parts of your body may feel week and/or numb.  This usually clears up by tonight or the next day.  Walk with the use of an assistive device or accompanied by an adult for the 24 hours.  You may use ice on the affected area for the first 24 hours.  Put ice in a Ziploc bag and cover with a towel and place against area 15 minutes on 15 minutes off.  You may switch to heat after 24 hours.Facet Blocks Patient Information  Description: The facets are joints in the spine between the vertebrae.  Like any joints in the body, facets can become irritated and painful.  Arthritis can also effect the facets.  By injecting steroids and local anesthetic in and around these joints, we can temporarily block the nerve supply to them.  Steroids act directly on irritated nerves and tissues to reduce selling and inflammation which often leads to decreased pain.  Facet blocks may be done anywhere along the spine from the neck to the low back depending upon the location of your pain.   After numbing the skin with local anesthetic (like Novocaine), a small needle is passed onto the facet joints under x-ray guidance.  You may experience a sensation of pressure while this is being done.  The entire block usually lasts about 15-25 minutes.   Conditions which may be treated by facet blocks:   Low back/buttock pain  Neck/shoulder pain  Certain types of headaches  Preparation for the injection:  1. Do not eat any solid food or dairy products within 8 hours of your appointment. 2. You may drink clear liquid up to 3 hours before appointment.  Clear liquids include water, black coffee, juice or soda.  No milk or cream  please. 3. You may take your regular medication, including pain medications, with a sip of water before your appointment.  Diabetics should hold regular insulin (if taken separately) and take 1/2 normal NPH dose the morning of the procedure.  Carry some sugar containing items with you to your appointment. 4. A driver must accompany you and be prepared to drive you home after your procedure. 5. Bring all your current medications with you. 6. An IV may be inserted and sedation may be given at the discretion of the physician. 7. A blood pressure cuff, EKG and other monitors will often be applied during the procedure.  Some patients may need to have extra oxygen administered for a short period. 8. You will be asked to provide medical information, including your allergies and medications, prior to the procedure.  We must know immediately if you are taking blood thinners (like Coumadin/Warfarin) or if you are allergic to IV iodine contrast (dye).  We must know if you could possible be pregnant.  Possible side-effects:   Bleeding from needle site  Infection (rare, may require surgery)  Nerve injury (rare)  Numbness & tingling (temporary)  Difficulty urinating (rare, temporary)  Spinal headache (a headache worse with upright posture)  Light-headedness (temporary)  Pain at injection site (serveral days)  Decreased blood pressure (rare, temporary)  Weakness in arm/leg (temporary)  Pressure sensation in back/neck (temporary)   Call if you experience:   Fever/chills associated with headache or increased back/neck pain  Headache worsened by an upright position  New onset, weakness or numbness of an extremity below the injection site  Hives or difficulty breathing (go to the emergency room)  Inflammation or drainage at the injection site(s)  Severe back/neck pain greater than usual  New symptoms which are concerning to you  Please note:  Although the local anesthetic injected  can often make your back or neck feel good for several hours after the injection, the pain will likely return. It takes 3-7 days for steroids to work.  You may not notice any pain relief for at least one week.  If effective, we will often do a series of 2-3 injections spaced 3-6 weeks apart to maximally decrease your pain.  After the initial series, you may be a candidate for a more permanent nerve block of the facets.  If you have any questions, please call #[phone]) [facility]____________________________________________________________________________________________  CANNABIDIOL (AKA: CBD Oil or Pills)  Applies to: All patients receiving prescriptions of controlled substances (written and/or electronic).  General Information: Cannabidiol (CBD) was discovered in 73. It is one of some 113 identified cannabinoids in cannabis (Marijuana) plants, accounting for up to 40% of the plant's extract. As of 2018, preliminary clinical research on cannabidiol included studies of anxiety, cognition, movement disorders, and pain.  Cannabidiol is consummed in multiple ways, including inhalation of cannabis smoke or vapor, as an aerosol spray into the cheek, and by mouth. It may be supplied as CBD oil containing CBD as the active ingredient (no added tetrahydrocannabinol (THC) or terpenes), a full-plant CBD-dominant hemp extract oil, capsules, dried cannabis, or as a liquid solution. CBD is thought not have the same psychoactivity as THC, and may affect the actions of THC. Studies suggest that CBD may interact with different biological targets, including cannabinoid receptors and other neurotransmitter receptors. As of 2018 the mechanism of action for its biological effects has not been determined.  In the [place], cannabidiol has a limited approval by the Food and Drug Administration (FDA) for treatment of only two types of epilepsy disorders. The side effects of  long-term use of the drug include somnolence, decreased appetite, diarrhea, fatigue, malaise, weakness, sleeping problems, and others.  CBD remains a Schedule I drug prohibited for any use.  Legality: Some manufacturers ship CBD products nationally, an illegal action which the FDA has not enforced in 2018, with CBD remaining the subject of an FDA investigational new drug evaluation, and is not considered legal as a dietary supplement or food ingredient as of December 2018. Federal illegality has made it difficult historically to conduct research on CBD. CBD is openly sold in head shops and health food stores in some states where such sales have not been explicitly legalized.  Warning: Because it is not FDA approved for general use or treatment of pain, it is not required to undergo the same manufacturing controls as prescription drugs.  This means that the available cannabidiol (CBD) may be contaminated with THC.  If this is the case, it will trigger a positive urine drug screen (UDS) test for cannabinoids (Marijuana).  Because  a positive UDS for illicit substances is a violation of our medication agreement, your opioid analgesics (pain medicine) may be permanently discontinued. (Last update: 12/11/2017) ____________________________________________________________________________________________

## 2018-07-23 NOTE — Progress Notes (Signed)
Patient's Name: [name] [name]  MRN: [id number]  Referring Provider: Jodi Marble, MD  DOB: 07/04/1946  PCP: Jodi Marble, MD  DOS: 07/27/2018  Note by: Gaspar Cola, MD  Service setting: Ambulatory outpatient  Specialty: Interventional Pain Management  Location: [facility] (AMB) Pain Management Facility    Patient type: Established   Primary Reason(s) for Visit: Encounter for post-procedure evaluation of chronic illness with mild to moderate exacerbation CC: Back Pain (low)  HPI  [name] [name] is a 72 y.o. year old, female patient, who comes today for a post-procedure evaluation. 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); Hx of total hip arthroplasty (Right); DDD (degenerative disc disease), lumbar; Abnormal MRI, lumbar spine (06/27/2018); Chronic lower extremity pain (Primary Area of Pain) (Right); Lumbosacral radiculopathy at L5 (Right); Chronic lumbar radicular pain (L5 dermatome) (Right); Lumbar spondylosis; Lumbar spondylosis with radiculopathy (Right); S/P total hip replacement (Right) (05/05/2018); Lumbar Grade 1 Anterolisthesis of L4 over L5; Lumbar facet hypertrophy (Bilateral); 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 Her primarily concern today is the Back Pain (low)  Pain Assessment: Location: Lower Back Radiating: denies Onset: More than a month ago Duration:  Chronic pain Quality: Constant, Aching Severity: 0-No pain/10 (subjective, self-reported pain score)  Note: Reported level is compatible with observation.                         When using our objective Pain Scale, levels between 6 and 10/10 are said to belong in an emergency room, as it progressively worsens from a 6/10, described as severely limiting, requiring emergency care not usually available at an outpatient pain management facility. At a 6/10 level, communication becomes difficult and requires great effort. Assistance to reach the emergency department may be required. Facial flushing and profuse sweating along with potentially dangerous increases in heart rate and blood pressure will be evident. Effect on ADL:   Timing: Intermittent Modifying factors: goes away when resting BP: 120/71  HR: 86  [name] [name] comes in today for post-procedure evaluation.  Further details on both, my assessment(s), as well as the proposed treatment plan, please see below.  Post-Procedure Assessment  07/15/2018 Procedure: Diagnostic right-sided L4-5 interlaminar ESI #1 under fluoroscopic guidance, no sedation. Pre-procedure pain score:  10/10 Post-procedure pain score: 0/10 (100% relief) Influential Factors: BMI: 33.30 kg/m Intra-procedural challenges: None observed.         Assessment challenges: None detected.              Reported side-effects: None.        Post-procedural adverse reactions or complications: None reported         Sedation: No sedation used. When no sedatives are used, the analgesic levels obtained are directly associated to the effectiveness of the local anesthetics. However, when sedation is provided, the level of analgesia obtained during the initial 1 hour following the intervention, is believed to be the result of a combination of factors. These factors may include, but are not limited to: 1.  The effectiveness of the local anesthetics used. 2. The effects of the analgesic(s)  and/or anxiolytic(s) used. 3. The degree of discomfort experienced by the patient at the time of the procedure. 4. The patients ability and reliability in recalling and recording the events. 5. The presence and influence of possible secondary gains and/or psychosocial factors. Reported result: Relief experienced during the 1st hour after the procedure: 50 % (Ultra-Short Term Relief)            Interpretative annotation: Clinically appropriate result. No IV Analgesic or Anxiolytic given, therefore benefits are completely due to Local Anesthetic effects.          Effects of local anesthetic: The analgesic effects attained during this period are directly associated to the localized infiltration of local anesthetics and therefore cary significant diagnostic value as to the etiological location, or anatomical origin, of the pain. Expected duration of relief is directly dependent on the pharmacodynamics of the local anesthetic used. Long-acting (4-6 hours) anesthetics used.  Reported result: Relief during the next 4 to 6 hour after the procedure: 50 % (Short-Term Relief)            Interpretative annotation: Clinically appropriate result. Analgesia during this period is likely to be Local Anesthetic-related.          Long-term benefit: Defined as the period of time past the expected duration of local anesthetics (1 hour for short-acting and 4-6 hours for long-acting). With the possible exception of prolonged sympathetic blockade from the local anesthetics, benefits during this period are typically attributed to, or associated with, other factors such as analgesic sensory neuropraxia, antiinflammatory effects, or beneficial biochemical changes provided by agents other than the local anesthetics.  Reported result: Extended relief following procedure: 100 %(continues as of today) (Long-Term Relief)            Interpretative annotation: Clinically possible results. Good relief. No permanent benefit expected.  Inflammation plays a part in the etiology to the pain.          Current benefits: Defined as reported results that persistent at this point in time.   Analgesia: 100 % [name] [name] reports improvement of extremity symptoms. Function: Ms. Snader reports improvement in function ROM: Ms. Halliwell reports improvement in ROM Interpretative annotation: Complete relief. Therapeutic success. Effective therapeutic approach. Benefit could be steroid-related.  Interpretation: Results would suggest a successful diagnostic intervention.                  Plan:  Set up procedure as a PRN palliative treatment option for this patient. All of the patient's radicular symptoms are now gone.  However, she is now noticing more of her low back pain.  This is likely to be secondary to lumbar facet syndrome.          Laboratory Chemistry  Inflammation Markers (CRP: Acute Phase) (ESR: Chronic Phase) No results found.  Rheumatology Markers No results found.  Renal Markers Lab Results  Component Value Date   BUN 21 (H) 05/06/2017   CREATININE 1.14 (H) 05/06/2017   GFRAA 55 (L) 05/06/2017   GFRNONAA 48 (L) 05/06/2017                             Hepatic Markers Lab Results  Component Value Date   AST 19 11/08/2016   ALT 15 11/08/2016   ALBUMIN 4.1 11/08/2016  Neuropathy Markers No results found.  Hematology Parameters Lab Results  Component Value Date   INR 1.0 12/23/2013   LABPROT 12.9 12/23/2013   APTT 37.6 (H) 12/23/2013   PLT 177 05/07/2017   HGB 9.6 (L) 05/07/2017   HCT 28.4 (L) 05/07/2017                        CV Markers Lab Results  Component Value Date   CKTOTAL 98 12/23/2013   CKMB 1.0 12/23/2013   TROPONINI < 0.02 12/23/2013                         Note: Lab results reviewed.  Recent Imaging Results   Results for orders placed in visit on 07/09/18  DG C-Arm 1-60 Min-No Report   Narrative Fluoroscopy was utilized by the requesting physician.  No  radiographic  interpretation.    Interpretation Report: Fluoroscopy was used during the procedure to assist with needle guidance. The images were interpreted intraoperatively by the requesting physician.  Meds   Current Outpatient Medications:  .  acarbose (PRECOSE) 100 MG tablet, Take 100 mg by mouth 3 (three) times daily with meals. , Disp: , Rfl:  .  acetaminophen (TYLENOL) 325 MG tablet, Take 2 tablets (650 mg total) by mouth every 6 (six) hours as needed for mild pain (or Fever >/= 101)., Disp: 60 tablet, Rfl: 1 .  aspirin 325 MG tablet, Take 1 tablet (325 mg total) by mouth 2 (two) times daily., Disp: 60 tablet, Rfl: 0 .  atorvastatin (LIPITOR) 20 MG tablet, Take 20 mg by mouth daily. , Disp: , Rfl:  .  glipiZIDE (GLUCOTROL) 10 MG tablet, Take 10 mg by mouth 2 (two) times daily before a meal. , Disp: , Rfl: 0 .  lisinopril-hydrochlorothiazide (PRINZIDE,ZESTORETIC) 20-12.5 MG tablet, Take 1 tablet by mouth daily., Disp: , Rfl:  .  pioglitazone (ACTOS) 45 MG tablet, Take 45 mg by mouth daily. , Disp: , Rfl: 0 .  sitaGLIPtin (JANUVIA) 100 MG tablet, Take 100 mg by mouth daily. , Disp: , Rfl:   ROS  Constitutional: Denies any fever or chills Gastrointestinal: No reported hemesis, hematochezia, vomiting, or acute GI distress Musculoskeletal: Denies any acute onset joint swelling, redness, loss of ROM, or weakness Neurological: No reported episodes of acute onset apraxia, aphasia, dysarthria, agnosia, amnesia, paralysis, loss of coordination, or loss of consciousness  Allergies  Ms. Friedly is allergic to cortisone; gabapentin; and nsaids.  PFSH  Drug: Ms. Dombeck  reports that she does not use drugs. Alcohol:  reports that she does not drink alcohol. Tobacco:  reports that she quit smoking about 6 years ago. She has never used smokeless tobacco. Medical:  has a past medical history of Arthritis, Diabetes mellitus without complication ([place]), Hypertension, and Stroke ([place]). Surgical:  Ms. Wojdyla  has a past surgical history that includes Cholecystectomy; Hip surgery; Joint replacement; and Total hip arthroplasty (Right, 05/05/2017). Family: family history includes Alcohol abuse in her father; Heart disease in her mother.  Constitutional Exam  General appearance: Well nourished, well developed, and well hydrated. In no apparent acute distress Vitals:   07/27/18 0824  BP: 120/71  Pulse: 86  Resp: 18  Temp: 98.3 F (36.8 C)  TempSrc: Oral  SpO2: 100%  Weight: 194 lb (88 kg)  Height: 5' 4" (1.626 m)   BMI Assessment: Estimated body mass index is 33.3 kg/m as calculated from the following:   Height as  of this encounter: 5' 4" (1.626 m).   Weight as of this encounter: 194 lb (88 kg).  BMI interpretation table: BMI level Category Range association with higher incidence of chronic pain  <18 kg/m2 Underweight   18.5-24.9 kg/m2 Ideal body weight   25-29.9 kg/m2 Overweight Increased incidence by 20%  30-34.9 kg/m2 Obese (Class I) Increased incidence by 68%  35-39.9 kg/m2 Severe obesity (Class II) Increased incidence by 136%  >40 kg/m2 Extreme obesity (Class III) Increased incidence by 254%   Patient's current BMI Ideal Body weight  Body mass index is 33.3 kg/m. Ideal body weight: 54.7 kg (120 lb 9.5 oz) Adjusted ideal body weight: 68 kg (149 lb 15.3 oz)   BMI Readings from Last 4 Encounters:  07/27/18 33.30 kg/m  07/09/18 33.30 kg/m  07/01/18 33.64 kg/m  05/05/17 35.87 kg/m   Wt Readings from Last 4 Encounters:  07/27/18 194 lb (88 kg)  07/09/18 194 lb (88 kg)  07/01/18 196 lb (88.9 kg)  05/05/17 209 lb (94.8 kg)  Psych/Mental status: Alert, oriented x 3 (person, place, & time)       Eyes: PERLA Respiratory: No evidence of acute respiratory distress  Cervical Spine Area Exam  Skin & Axial Inspection: No masses, redness, edema, swelling, or associated skin lesions Alignment: Symmetrical Functional ROM: Unrestricted ROM      Stability: No instability  detected Muscle Tone/Strength: Functionally intact. No obvious neuro-muscular anomalies detected. Sensory (Neurological): Unimpaired Palpation: No palpable anomalies              Upper Extremity (UE) Exam    Side: Right upper extremity  Side: Left upper extremity  Skin & Extremity Inspection: Skin color, temperature, and hair growth are WNL. No peripheral edema or cyanosis. No masses, redness, swelling, asymmetry, or associated skin lesions. No contractures.  Skin & Extremity Inspection: Skin color, temperature, and hair growth are WNL. No peripheral edema or cyanosis. No masses, redness, swelling, asymmetry, or associated skin lesions. No contractures.  Functional ROM: Unrestricted ROM          Functional ROM: Unrestricted ROM          Muscle Tone/Strength: Functionally intact. No obvious neuro-muscular anomalies detected.  Muscle Tone/Strength: Functionally intact. No obvious neuro-muscular anomalies detected.  Sensory (Neurological): Unimpaired          Sensory (Neurological): Unimpaired          Palpation: No palpable anomalies              Palpation: No palpable anomalies              Provocative Test(s):  Phalen's test: deferred Tinel's test: deferred Apley's scratch test (touch opposite shoulder):  Action 1 (Across chest): deferred Action 2 (Overhead): deferred Action 3 (LB reach): deferred   Provocative Test(s):  Phalen's test: deferred Tinel's test: deferred Apley's scratch test (touch opposite shoulder):  Action 1 (Across chest): deferred Action 2 (Overhead): deferred Action 3 (LB reach): deferred    Thoracic Spine Area Exam  Skin & Axial Inspection: No masses, redness, or swelling Alignment: Symmetrical Functional ROM: Unrestricted ROM Stability: No instability detected Muscle Tone/Strength: Functionally intact. No obvious neuro-muscular anomalies detected. Sensory (Neurological): Unimpaired Muscle strength & Tone: No palpable anomalies  Lumbar Spine Area Exam  Skin &  Axial Inspection: No masses, redness, or swelling Alignment: Symmetrical Functional ROM: Decreased ROM affecting both sides Stability: No instability detected Muscle Tone/Strength: Functionally intact. No obvious neuro-muscular anomalies detected. Sensory (Neurological): Movement-associated pain Palpation: Complains of area being tender  to palpation       Provocative Tests: Hyperextension/rotation test: (+) bilaterally for facet joint pain. Lumbar quadrant test (Kemp's test): (+) bilaterally for facet joint pain. Lateral bending test: deferred today       Patrick's Maneuver: deferred today                   FABER test: deferred today                   S-I anterior distraction/compression test: deferred today         S-I lateral compression test: deferred today         S-I Thigh-thrust test: deferred today         S-I Gaenslen's test: deferred today          Gait & Posture Assessment  Ambulation: Unassisted Gait: Relatively normal for age and body habitus Posture: WNL   Lower Extremity Exam    Side: Right lower extremity  Side: Left lower extremity  Stability: No instability observed          Stability: No instability observed          Skin & Extremity Inspection: Skin color, temperature, and hair growth are WNL. No peripheral edema or cyanosis. No masses, redness, swelling, asymmetry, or associated skin lesions. No contractures.  Skin & Extremity Inspection: Skin color, temperature, and hair growth are WNL. No peripheral edema or cyanosis. No masses, redness, swelling, asymmetry, or associated skin lesions. No contractures.  Functional ROM: Unrestricted ROM                  Functional ROM: Unrestricted ROM                  Muscle Tone/Strength: Functionally intact. No obvious neuro-muscular anomalies detected.  Muscle Tone/Strength: Functionally intact. No obvious neuro-muscular anomalies detected.  Sensory (Neurological): Unimpaired  Sensory (Neurological): Unimpaired  Palpation: No  palpable anomalies  Palpation: No palpable anomalies   Assessment  Primary Diagnosis & Pertinent Problem List: The primary encounter diagnosis was Chronic lower extremity pain (Primary Area of Pain) (Right). 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  Status Diagnosis  Controlled Controlled Controlled 1. Chronic lower extremity pain (Primary Area of Pain) (Right)   2. Chronic lumbar radicular pain (L5 dermatome) (Right)   3. Lumbosacral radiculopathy at L5 (Right)   4. Chronic low back pain (Secondary Area of Pain) (Bilateral) w/ sciatica (Right)   5. DDD (degenerative disc disease), lumbar   6. Lumbar Grade 1 Anterolisthesis of L4 over L5   7. 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 Chronic pain disorder   9. Pharmacologic therapy   10. Disorder of skeletal system   11. Problems influencing health status   12. Lumbar facet arthropathy   13. Lumbar facet hypertrophy (Bilateral)   14. Lumbar facet syndrome   15. Chronic low back pain (Secondary Area of Pain) (Bilateral) (R>L) w/o sciatica     Problems updated and reviewed during this  visit: Problem  Chronic Pain Disorder  Lumbar facet syndrome  Chronic low back pain (Secondary Area of Pain) (Bilateral) (R>L) w/o sciatica  Abnormal MRI, lumbar spine (06/27/2018)   FINDINGS: Segmentation: Transitional. Full  size ribs at T12 and hypoplastic ribs at L1 demonstrated on the February comparison. This numbering system differs from that on 12/10/2017, today a nearly full size S1-S2 disc space is designated but the S1 level is otherwise sacralized. Correlation with radiographs is recommended prior to any operative intervention.  Alignment: Stable preserved lumbar lordosis with mild dextroconvex spinal curvature. Mild grade 1 anterolisthesis of L4 on L5.  Vertebrae: Degenerative appearing left lateral endplate marrow edema at L2-L3. Background bone marrow signal appears normal. No suspicious marrow lesion identified. Intact visible sacrum and SI joints.  Conus medullaris and cauda equina: Conus extends to the L1 level. No lower spinal cord or conus signal abnormality.  Paraspinal and other soft tissues: Negative.  Disc levels:  No lower thoracic spinal stenosis.  L1-L2:  Mild disc bulge.  No stenosis.  L2-L3: Chronic disc desiccation disc space loss and endplate degeneration. Left eccentric circumferential disc bulge and endplate spurring with broad-based left subarticular and foraminal component. Mild facet and ligament flavum hypertrophy. No spinal stenosis or convincing lateral recess stenosis. Stable moderate to severe left L2 foraminal stenosis. No right side stenosis.  L3-L4: Circumferential disc bulge with mild to moderate facet and ligament flavum hypertrophy. Chronic left facet joint fluid. No spinal or lateral recess stenosis. Moderate left and mild right L3 foraminal stenosis is stable.  L4-L5: Grade 1 anterolisthesis. Circumferential disc bulge with broad-based posterior and subarticular components. Severe facet and mild to moderate ligament flavum hypertrophy. Mild bilateral lateral recess stenosis without spinal stenosis (L5 nerve levels). Mild left and moderate right L4 foraminal stenosis (series 3, image 6. This level is stable.  L5-S1: Circumferential but mostly far lateral disc bulging.  Severe right and mild to moderate left facet hypertrophy. Chronic right facet joint fluid. No spinal stenosis. Mild right lateral recess stenosis (right S1 nerve level). Mild to moderate right L5 foraminal stenosis (series 3, image 6). This level is stable.  S1-S2: Vestigial disc space, otherwise negative.  IMPRESSION: 1. Transitional lumbosacral anatomy with a numbering system today, based on the skeletal survey in February, that differs from the 12/10/2017 MRI. Correlation with radiographs is recommended prior to any operative intervention. 2. In terms of right side radiculopathy there is L4-L5 and L5-S1 degeneration with up to moderate stenosis at the right L4 and L5 nerve levels, plus mild right lateral recess stenosis affecting the right S1 nerve level. 3. Advanced disc and endplate degeneration at L2-L3 with moderate to severe left L2 foraminal stenosis. Moderate left foraminal stenosis at the L3-L4 level.  Electronically Signed   By: Genevie Ann M.D.   On: 06/27/2018 12:32    Pharmacologic Therapy  Disorder of Skeletal System  Problems Influencing Health Status   Plan of Care  Pharmacotherapy (Medications Ordered): No orders of the defined types were placed in this encounter.  Medications administered today: Willena C. Hedlund had no medications administered during this visit.   Procedure Orders     LUMBAR FACET(MEDIAL BRANCH NERVE BLOCK) MBNB  Lab Orders     Comp. Metabolic Panel (12)     Magnesium     Vitamin B12     Sedimentation rate     25-Hydroxyvitamin D Lcms D2+D3     C-reactive protein Imaging Orders  No imaging studies ordered today   Referral Orders  No referral(s) requested today   Interventional management options: Planned, scheduled, and/or pending:   NOTE: Severe right-sided L5-S1 lumbar facet hypertrophy blocking the path to transforaminal injections at that level. Diagnostic bilateral lumbar facet block #1 under fluoroscopic guidance and IV sedation    Considering:   Diagnostic left-sided L2 transforaminal ESI  Diagnostic bilateral L3 transforaminal ESI  Diagnostic bilateral L3 transforaminal ESI  Diagnostic right-sided L4-5 interlaminar LESI  Diagnostic right-sided L5-S1 interlaminar LESI  Diagnostic bilateral lumbar facet block  Possible bilateral lumbar facet RFA  Diagnostic/therapeutic left-sided L3-4 intra-articular facet joint injection  Diagnostic/therapeutic right-sided L5-S1 intra-articular facet joint injection    Palliative PRN treatment(s):   Diagnostic right-sided L4-5 interlaminar ESI #1 under fluoroscopic guidance, no sedation   Provider-requested follow-up: Return in about 2 weeks (around 08/10/2018) for Procedure (w/ sedation): (B) L-FCT BLK #1.  Future Appointments  Date Time Provider [place]  08/13/2018  8:00 AM Milinda Pointer, MD [facility] None   Primary Care Physician: Jodi Marble, MD Location: [facility] Out[facility] Pain Management Facility Note by: Gaspar Cola, MD Date: 07/27/2018; Time: 9:59 AM

## 2018-07-27 ENCOUNTER — Encounter: Payer: Self-pay | Admitting: Pain Medicine

## 2018-07-27 ENCOUNTER — Other Ambulatory Visit: Payer: Self-pay

## 2018-07-27 ENCOUNTER — Ambulatory Visit: Payer: Medicare Other | Attending: Pain Medicine | Admitting: Pain Medicine

## 2018-07-27 VITALS — BP 120/71 | HR 86 | Temp 98.3°F | Resp 18 | Ht 64.0 in | Wt 194.0 lb

## 2018-07-27 DIAGNOSIS — Z7982 Long term (current) use of aspirin: Secondary | ICD-10-CM | POA: Insufficient documentation

## 2018-07-27 DIAGNOSIS — E559 Vitamin D deficiency, unspecified: Secondary | ICD-10-CM

## 2018-07-27 DIAGNOSIS — M79604 Pain in right leg: Secondary | ICD-10-CM | POA: Diagnosis not present

## 2018-07-27 DIAGNOSIS — M4310 Spondylolisthesis, site unspecified: Secondary | ICD-10-CM

## 2018-07-27 DIAGNOSIS — M5441 Lumbago with sciatica, right side: Secondary | ICD-10-CM | POA: Diagnosis not present

## 2018-07-27 DIAGNOSIS — Z87891 Personal history of nicotine dependence: Secondary | ICD-10-CM | POA: Diagnosis not present

## 2018-07-27 DIAGNOSIS — Z886 Allergy status to analgesic agent status: Secondary | ICD-10-CM | POA: Insufficient documentation

## 2018-07-27 DIAGNOSIS — M4316 Spondylolisthesis, lumbar region: Secondary | ICD-10-CM | POA: Insufficient documentation

## 2018-07-27 DIAGNOSIS — Z789 Other specified health status: Secondary | ICD-10-CM | POA: Insufficient documentation

## 2018-07-27 DIAGNOSIS — G894 Chronic pain syndrome: Secondary | ICD-10-CM

## 2018-07-27 DIAGNOSIS — M5416 Radiculopathy, lumbar region: Secondary | ICD-10-CM | POA: Diagnosis not present

## 2018-07-27 DIAGNOSIS — M4726 Other spondylosis with radiculopathy, lumbar region: Secondary | ICD-10-CM | POA: Diagnosis not present

## 2018-07-27 DIAGNOSIS — Z79899 Other long term (current) drug therapy: Secondary | ICD-10-CM

## 2018-07-27 DIAGNOSIS — I10 Essential (primary) hypertension: Secondary | ICD-10-CM | POA: Insufficient documentation

## 2018-07-27 DIAGNOSIS — M48061 Spinal stenosis, lumbar region without neurogenic claudication: Secondary | ICD-10-CM | POA: Diagnosis not present

## 2018-07-27 DIAGNOSIS — Z8673 Personal history of transient ischemic attack (TIA), and cerebral infarction without residual deficits: Secondary | ICD-10-CM | POA: Insufficient documentation

## 2018-07-27 DIAGNOSIS — M1611 Unilateral primary osteoarthritis, right hip: Secondary | ICD-10-CM | POA: Diagnosis not present

## 2018-07-27 DIAGNOSIS — Z888 Allergy status to other drugs, medicaments and biological substances status: Secondary | ICD-10-CM | POA: Insufficient documentation

## 2018-07-27 DIAGNOSIS — M545 Low back pain: Secondary | ICD-10-CM

## 2018-07-27 DIAGNOSIS — M899 Disorder of bone, unspecified: Secondary | ICD-10-CM

## 2018-07-27 DIAGNOSIS — M5417 Radiculopathy, lumbosacral region: Secondary | ICD-10-CM | POA: Diagnosis not present

## 2018-07-27 DIAGNOSIS — M4807 Spinal stenosis, lumbosacral region: Secondary | ICD-10-CM | POA: Diagnosis not present

## 2018-07-27 DIAGNOSIS — M8938 Hypertrophy of bone, other site: Secondary | ICD-10-CM | POA: Diagnosis not present

## 2018-07-27 DIAGNOSIS — M5116 Intervertebral disc disorders with radiculopathy, lumbar region: Secondary | ICD-10-CM | POA: Diagnosis not present

## 2018-07-27 DIAGNOSIS — Z5181 Encounter for therapeutic drug level monitoring: Secondary | ICD-10-CM | POA: Diagnosis not present

## 2018-07-27 DIAGNOSIS — Z7984 Long term (current) use of oral hypoglycemic drugs: Secondary | ICD-10-CM | POA: Diagnosis not present

## 2018-07-27 DIAGNOSIS — M5117 Intervertebral disc disorders with radiculopathy, lumbosacral region: Secondary | ICD-10-CM | POA: Insufficient documentation

## 2018-07-27 DIAGNOSIS — Z8249 Family history of ischemic heart disease and other diseases of the circulatory system: Secondary | ICD-10-CM | POA: Diagnosis not present

## 2018-07-27 DIAGNOSIS — Z96641 Presence of right artificial hip joint: Secondary | ICD-10-CM | POA: Insufficient documentation

## 2018-07-27 DIAGNOSIS — D472 Monoclonal gammopathy: Secondary | ICD-10-CM | POA: Insufficient documentation

## 2018-07-27 DIAGNOSIS — G8929 Other chronic pain: Secondary | ICD-10-CM

## 2018-07-27 DIAGNOSIS — E119 Type 2 diabetes mellitus without complications: Secondary | ICD-10-CM | POA: Insufficient documentation

## 2018-07-27 DIAGNOSIS — M5136 Other intervertebral disc degeneration, lumbar region: Secondary | ICD-10-CM

## 2018-07-27 DIAGNOSIS — M47816 Spondylosis without myelopathy or radiculopathy, lumbar region: Secondary | ICD-10-CM | POA: Insufficient documentation

## 2018-07-27 DIAGNOSIS — M51369 Other intervertebral disc degeneration, lumbar region without mention of lumbar back pain or lower extremity pain: Secondary | ICD-10-CM

## 2018-07-27 NOTE — Progress Notes (Signed)
Safety precautions to be maintained throughout the outpatient stay will include: orient to surroundings, keep bed in low position, maintain call bell within reach at all times, provide assistance with transfer out of bed and ambulation.  

## 2018-07-31 LAB — MAGNESIUM: Magnesium: 2.2 mg/dL (ref 1.6–2.3)

## 2018-07-31 LAB — COMP. METABOLIC PANEL (12)
ALBUMIN: 4.1 g/dL (ref 3.5–4.8)
AST: 12 IU/L (ref 0–40)
Albumin/Globulin Ratio: 1 — ABNORMAL LOW (ref 1.2–2.2)
Alkaline Phosphatase: 58 IU/L (ref 39–117)
BUN / CREAT RATIO: 22 (ref 12–28)
BUN: 29 mg/dL — ABNORMAL HIGH (ref 8–27)
Bilirubin Total: 0.5 mg/dL (ref 0.0–1.2)
CALCIUM: 9.4 mg/dL (ref 8.7–10.3)
Chloride: 103 mmol/L (ref 96–106)
Creatinine, Ser: 1.31 mg/dL — ABNORMAL HIGH (ref 0.57–1.00)
GFR, EST AFRICAN AMERICAN: 47 mL/min/{1.73_m2} — AB (ref >59)
GFR, EST NON AFRICAN AMERICAN: 41 mL/min/{1.73_m2} — AB (ref >59)
GLOBULIN, TOTAL: 4 g/dL (ref 1.5–4.5)
Glucose: 169 mg/dL — ABNORMAL HIGH (ref 65–99)
POTASSIUM: 4.4 mmol/L (ref 3.5–5.2)
SODIUM: 137 mmol/L (ref 134–144)
Total Protein: 8.1 g/dL (ref 6.0–8.5)

## 2018-07-31 LAB — 25-HYDROXYVITAMIN D LCMS D2+D3: 25-HYDROXY, VITAMIN D-3: 14 ng/mL

## 2018-07-31 LAB — SEDIMENTATION RATE: SED RATE: 43 mm/h — AB (ref 0–40)

## 2018-07-31 LAB — VITAMIN B12: Vitamin B-12: 451 pg/mL (ref 232–1245)

## 2018-07-31 LAB — 25-HYDROXY VITAMIN D LCMS D2+D3
25-Hydroxy, Vitamin D-2: 6.6 ng/mL
25-Hydroxy, Vitamin D: 21 ng/mL — ABNORMAL LOW

## 2018-07-31 LAB — C-REACTIVE PROTEIN: CRP: 2 mg/L (ref 0–10)

## 2018-08-13 ENCOUNTER — Ambulatory Visit: Payer: Medicare Other | Admitting: Pain Medicine

## 2018-08-18 ENCOUNTER — Encounter: Payer: Self-pay | Admitting: Pain Medicine

## 2018-08-18 DIAGNOSIS — E559 Vitamin D deficiency, unspecified: Secondary | ICD-10-CM | POA: Insufficient documentation

## 2018-08-18 MED ORDER — MAGNESIUM 500 MG PO CAPS: 500.0000 mg | ORAL_CAPSULE | Freq: Every day | ORAL | 5 refills | Status: DC

## 2018-08-18 MED ORDER — VITAMIN D3 125 MCG (5000 UT) PO CAPS: 1.0000 | ORAL_CAPSULE | Freq: Every day | ORAL | 5 refills | Status: AC

## 2018-08-18 MED ORDER — GNP CALCIUM 1200 1200-1000 MG-UNIT PO CHEW: 1200.0000 mg | CHEWABLE_TABLET | Freq: Every day | ORAL | 5 refills | Status: DC

## 2018-08-18 MED ORDER — ERGOCALCIFEROL 1.25 MG (50000 UT) PO CAPS: 50000.0000 [IU] | ORAL_CAPSULE | ORAL | 0 refills | Status: AC

## 2018-08-18 NOTE — Addendum Note (Signed)
Addended by: Milinda Pointer A on: 08/18/2018 11:31 AM   Modules accepted: Orders

## 2019-11-05 ENCOUNTER — Ambulatory Visit: Payer: Medicare Other | Attending: Internal Medicine

## 2019-11-05 DIAGNOSIS — Z23 Encounter for immunization: Secondary | ICD-10-CM

## 2019-11-05 NOTE — Progress Notes (Signed)
   Covid-19 Vaccination Clinic  Name:  [name] [name]    MRN: [id number] DOB: 04-14-1946  11/05/2019  Ms. Binz was observed post Covid-19 immunization for 15 minutes without incidence. She was provided with Vaccine Information Sheet and instruction to access the V-Safe system.   Ms. Blancher was instructed to call 911 with any severe reactions post vaccine: Marland Kitchen Difficulty breathing  . Swelling of your face and throat  . A fast heartbeat  . A bad rash all over your body  . Dizziness and weakness    Immunizations Administered    Name Date Dose VIS Date Route   Pfizer COVID-19 Vaccine 11/05/2019 12:04 PM 0.3 mL 09/03/2019 Intramuscular   Manufacturer: [place]   Lot: [id number]   [place]: [id number]

## 2019-11-30 ENCOUNTER — Ambulatory Visit: Payer: Medicare Other | Attending: Internal Medicine

## 2019-11-30 DIAGNOSIS — Z23 Encounter for immunization: Secondary | ICD-10-CM | POA: Insufficient documentation

## 2019-11-30 NOTE — Progress Notes (Signed)
   Covid-19 Vaccination Clinic  Name:  Shaakira C Zuk    MRN: [id number] DOB: June 07, 1946  11/30/2019  Ms. Rieke was observed post Covid-19 immunization for 15 minutes without incident. She was provided with Vaccine Information Sheet and instruction to access the V-Safe system.   Ms. Lohn was instructed to call 911 with any severe reactions post vaccine: Marland Kitchen Difficulty breathing  . Swelling of face and throat  . A fast heartbeat  . A bad rash all over body  . Dizziness and weakness   Immunizations Administered    Name Date Dose VIS Date Route   Pfizer COVID-19 Vaccine 11/30/2019  3:51 PM 0.3 mL 09/03/2019 Intramuscular   Manufacturer: [place]   Lot: [id number]   [place]: [id number]

## 2020-09-04 ENCOUNTER — Other Ambulatory Visit: Payer: Self-pay | Admitting: Internal Medicine

## 2020-09-04 DIAGNOSIS — Z1231 Encounter for screening mammogram for malignant neoplasm of breast: Secondary | ICD-10-CM

## 2020-10-25 ENCOUNTER — Other Ambulatory Visit: Payer: Self-pay

## 2020-10-25 ENCOUNTER — Ambulatory Visit
Admission: RE | Admit: 2020-10-25 | Discharge: 2020-10-25 | Disposition: A | Discharge status: Home / Self Care | Payer: Medicare Other | Source: Ambulatory Visit | Attending: Internal Medicine | Admitting: Internal Medicine

## 2020-10-25 DIAGNOSIS — Z1231 Encounter for screening mammogram for malignant neoplasm of breast: Secondary | ICD-10-CM | POA: Diagnosis present

## 2021-11-27 ENCOUNTER — Other Ambulatory Visit: Payer: Self-pay | Admitting: Internal Medicine

## 2021-11-27 DIAGNOSIS — Z1231 Encounter for screening mammogram for malignant neoplasm of breast: Secondary | ICD-10-CM

## 2021-12-03 ENCOUNTER — Ambulatory Visit
Admission: RE | Admit: 2021-12-03 | Discharge: 2021-12-03 | Disposition: A | Discharge status: Home / Self Care | Payer: Medicare Other | Source: Ambulatory Visit | Attending: Internal Medicine | Admitting: Internal Medicine

## 2021-12-03 ENCOUNTER — Other Ambulatory Visit: Payer: Self-pay

## 2021-12-03 DIAGNOSIS — Z1231 Encounter for screening mammogram for malignant neoplasm of breast: Secondary | ICD-10-CM | POA: Insufficient documentation

## 2022-01-31 ENCOUNTER — Ambulatory Visit (INDEPENDENT_AMBULATORY_CARE_PROVIDER_SITE_OTHER): Payer: Medicare Other | Admitting: Podiatry

## 2022-01-31 ENCOUNTER — Encounter: Payer: Self-pay | Admitting: Podiatry

## 2022-01-31 DIAGNOSIS — M79674 Pain in right toe(s): Secondary | ICD-10-CM | POA: Diagnosis not present

## 2022-01-31 DIAGNOSIS — E119 Type 2 diabetes mellitus without complications: Secondary | ICD-10-CM | POA: Diagnosis not present

## 2022-01-31 DIAGNOSIS — M79675 Pain in left toe(s): Secondary | ICD-10-CM

## 2022-01-31 DIAGNOSIS — B351 Tinea unguium: Secondary | ICD-10-CM

## 2022-01-31 NOTE — Progress Notes (Signed)
This patient presents  to my office for at risk foot care.  This patient requires this care by a professional since this patient will be at risk due to having diabetes   This patient is unable to cut nails herself since the patient cannot reach her nails.These nails are painful walking and wearing shoes.  This patient presents for at risk foot care today. ? ?General Appearance  Alert, conversant and in no acute stress. ? ?Vascular  Dorsalis pedis and posterior tibial  pulses are  weakly palpable  bilaterally.  Capillary return is within normal limits  bilaterally. Temperature is within normal limits  bilaterally. ? ?Neurologic  Senn-Weinstein monofilament wire test within normal limits  bilaterally. Muscle power within normal limits bilaterally. ? ?Nails Thick disfigured discolored nails with subungual debris  from hallux to fifth toes bilaterally. No evidence of bacterial infection or drainage bilaterally. ? ?Orthopedic  No limitations of motion  feet .  No crepitus or effusions noted.  No bony pathology or digital deformities noted. ? ?Skin  normotropic skin with no porokeratosis noted bilaterally.  No signs of infections or ulcers noted.    ? ?Onychomycosis  Pain in right toes  Pain in left toes ? ?Consent was obtained for treatment procedures.   IE   Mechanical debridement of nails 1-5  bilaterally performed with a nail nipper.  Filed with dremel without incident.  ? ? ?Return office visit   prn                  Told patient to return for periodic foot care and evaluation due to potential at risk complications. ? ? ?Gardiner Barefoot DPM   ?

## 2022-10-08 DIAGNOSIS — E1121 Type 2 diabetes mellitus with diabetic nephropathy: Secondary | ICD-10-CM | POA: Insufficient documentation

## 2022-10-08 DIAGNOSIS — E1141 Type 2 diabetes mellitus with diabetic mononeuropathy: Secondary | ICD-10-CM | POA: Insufficient documentation

## 2022-10-08 DIAGNOSIS — N1832 Chronic kidney disease, stage 3b: Secondary | ICD-10-CM | POA: Insufficient documentation

## 2022-10-08 DIAGNOSIS — I700 Atherosclerosis of aorta: Secondary | ICD-10-CM | POA: Insufficient documentation

## 2022-10-29 ENCOUNTER — Other Ambulatory Visit: Payer: Self-pay | Admitting: Internal Medicine

## 2022-10-29 DIAGNOSIS — Z1231 Encounter for screening mammogram for malignant neoplasm of breast: Secondary | ICD-10-CM

## 2022-11-08 DIAGNOSIS — Z794 Long term (current) use of insulin: Secondary | ICD-10-CM | POA: Insufficient documentation

## 2022-12-05 ENCOUNTER — Ambulatory Visit
Admission: RE | Admit: 2022-12-05 | Discharge: 2022-12-05 | Disposition: A | Discharge status: Home / Self Care | Payer: Medicare HMO | Source: Ambulatory Visit | Attending: Internal Medicine | Admitting: Internal Medicine

## 2022-12-05 DIAGNOSIS — Z1231 Encounter for screening mammogram for malignant neoplasm of breast: Secondary | ICD-10-CM | POA: Diagnosis present

## 2023-01-28 ENCOUNTER — Emergency Department: Payer: Medicare HMO

## 2023-01-28 ENCOUNTER — Observation Stay
Admission: EM | Admit: 2023-01-28 | Discharge: 2023-01-31 | Disposition: A | Discharge status: Home / Self Care | Payer: Medicare HMO | Attending: Student | Admitting: Student

## 2023-01-28 ENCOUNTER — Encounter: Payer: Self-pay | Admitting: Emergency Medicine

## 2023-01-28 ENCOUNTER — Other Ambulatory Visit: Payer: Self-pay

## 2023-01-28 DIAGNOSIS — N1831 Chronic kidney disease, stage 3a: Secondary | ICD-10-CM | POA: Diagnosis not present

## 2023-01-28 DIAGNOSIS — R748 Abnormal levels of other serum enzymes: Secondary | ICD-10-CM

## 2023-01-28 DIAGNOSIS — Z794 Long term (current) use of insulin: Secondary | ICD-10-CM | POA: Insufficient documentation

## 2023-01-28 DIAGNOSIS — Z8673 Personal history of transient ischemic attack (TIA), and cerebral infarction without residual deficits: Secondary | ICD-10-CM | POA: Insufficient documentation

## 2023-01-28 DIAGNOSIS — Z96643 Presence of artificial hip joint, bilateral: Secondary | ICD-10-CM | POA: Diagnosis not present

## 2023-01-28 DIAGNOSIS — N179 Acute kidney failure, unspecified: Secondary | ICD-10-CM

## 2023-01-28 DIAGNOSIS — Z7982 Long term (current) use of aspirin: Secondary | ICD-10-CM | POA: Insufficient documentation

## 2023-01-28 DIAGNOSIS — Z87891 Personal history of nicotine dependence: Secondary | ICD-10-CM | POA: Diagnosis not present

## 2023-01-28 DIAGNOSIS — R1013 Epigastric pain: Secondary | ICD-10-CM

## 2023-01-28 DIAGNOSIS — E1165 Type 2 diabetes mellitus with hyperglycemia: Secondary | ICD-10-CM

## 2023-01-28 DIAGNOSIS — D72829 Elevated white blood cell count, unspecified: Secondary | ICD-10-CM | POA: Insufficient documentation

## 2023-01-28 DIAGNOSIS — I129 Hypertensive chronic kidney disease with stage 1 through stage 4 chronic kidney disease, or unspecified chronic kidney disease: Secondary | ICD-10-CM | POA: Diagnosis not present

## 2023-01-28 DIAGNOSIS — B179 Acute viral hepatitis, unspecified: Principal | ICD-10-CM | POA: Diagnosis present

## 2023-01-28 DIAGNOSIS — R945 Abnormal results of liver function studies: Secondary | ICD-10-CM | POA: Insufficient documentation

## 2023-01-28 DIAGNOSIS — R7401 Elevation of levels of liver transaminase levels: Secondary | ICD-10-CM | POA: Diagnosis not present

## 2023-01-28 DIAGNOSIS — E1122 Type 2 diabetes mellitus with diabetic chronic kidney disease: Secondary | ICD-10-CM | POA: Diagnosis not present

## 2023-01-28 DIAGNOSIS — Z7984 Long term (current) use of oral hypoglycemic drugs: Secondary | ICD-10-CM | POA: Insufficient documentation

## 2023-01-28 DIAGNOSIS — I10 Essential (primary) hypertension: Secondary | ICD-10-CM | POA: Diagnosis not present

## 2023-01-28 DIAGNOSIS — E785 Hyperlipidemia, unspecified: Secondary | ICD-10-CM | POA: Insufficient documentation

## 2023-01-28 DIAGNOSIS — N189 Chronic kidney disease, unspecified: Secondary | ICD-10-CM

## 2023-01-28 DIAGNOSIS — Z79899 Other long term (current) drug therapy: Secondary | ICD-10-CM | POA: Insufficient documentation

## 2023-01-28 LAB — COMPREHENSIVE METABOLIC PANEL
ALT: 311 U/L — ABNORMAL HIGH (ref 0–44)
AST: 696 U/L — ABNORMAL HIGH (ref 15–41)
Albumin: 3.9 g/dL (ref 3.5–5.0)
Alkaline Phosphatase: 499 U/L — ABNORMAL HIGH (ref 38–126)
Anion gap: 13 (ref 5–15)
BUN: 46 mg/dL — ABNORMAL HIGH (ref 8–23)
CO2: 22 mmol/L (ref 22–32)
Calcium: 9.5 mg/dL (ref 8.9–10.3)
Chloride: 103 mmol/L (ref 98–111)
Creatinine, Ser: 1.59 mg/dL — ABNORMAL HIGH (ref 0.44–1.00)
GFR, Estimated: 34 mL/min — ABNORMAL LOW (ref >60)
Glucose, Bld: 291 mg/dL — ABNORMAL HIGH (ref 70–99)
Potassium: 4.5 mmol/L (ref 3.5–5.1)
Sodium: 138 mmol/L (ref 135–145)
Total Bilirubin: 2.9 mg/dL — ABNORMAL HIGH (ref 0.3–1.2)
Total Protein: 9.4 g/dL — ABNORMAL HIGH (ref 6.5–8.1)

## 2023-01-28 LAB — URINALYSIS, ROUTINE W REFLEX MICROSCOPIC
Bacteria, UA: NONE SEEN
Bilirubin Urine: NEGATIVE
Glucose, UA: >=500 mg/dL — AB
Hgb urine dipstick: NEGATIVE
Ketones, ur: 5 mg/dL — AB
Leukocytes,Ua: NEGATIVE
Nitrite: NEGATIVE
Protein, ur: NEGATIVE mg/dL
Specific Gravity, Urine: 1.022 (ref 1.005–1.030)
pH: 5 (ref 5.0–8.0)

## 2023-01-28 LAB — CBG MONITORING, ED: Glucose-Capillary: 256 mg/dL — ABNORMAL HIGH (ref 70–99)

## 2023-01-28 LAB — CBC
HCT: 41.2 % (ref 36.0–46.0)
Hemoglobin: 13.1 g/dL (ref 12.0–15.0)
MCH: 28.7 pg (ref 26.0–34.0)
MCHC: 31.8 g/dL (ref 30.0–36.0)
MCV: 90.4 fL (ref 80.0–100.0)
Platelets: 324 10*3/uL (ref 150–400)
RBC: 4.56 MIL/uL (ref 3.87–5.11)
RDW: 13.6 % (ref 11.5–15.5)
WBC: 24.1 10*3/uL — ABNORMAL HIGH (ref 4.0–10.5)
nRBC: 0 % (ref 0.0–0.2)

## 2023-01-28 LAB — LIPASE, BLOOD: Lipase: 59 U/L — ABNORMAL HIGH (ref 11–51)

## 2023-01-28 MED ORDER — TRAMADOL HCL 50 MG PO TABS: 50.0000 mg | ORAL_TABLET | Freq: Three times a day (TID) | ORAL | Status: DC | PRN

## 2023-01-28 MED ORDER — MAGNESIUM OXIDE -MG SUPPLEMENT 400 (240 MG) MG PO TABS
400.0000 mg | ORAL_TABLET | Freq: Every day | ORAL | Status: DC
  Administered 2023-01-29 – 2023-01-31 (×3): 400 mg via ORAL
  Filled 2023-01-28 (×3): qty 1

## 2023-01-28 MED ORDER — MAGNESIUM HYDROXIDE 400 MG/5ML PO SUSP: 30.0000 mL | Freq: Every day | ORAL | Status: DC | PRN

## 2023-01-28 MED ORDER — TRAZODONE HCL 50 MG PO TABS: 25.0000 mg | ORAL_TABLET | Freq: Every evening | ORAL | Status: DC | PRN

## 2023-01-28 MED ORDER — TRIAMCINOLONE ACETONIDE 0.1 % EX CREA
1.0000 | TOPICAL_CREAM | Freq: Three times a day (TID) | CUTANEOUS | Status: DC
  Administered 2023-01-29 – 2023-01-31 (×6): 1 via TOPICAL
  Filled 2023-01-28: qty 15

## 2023-01-28 MED ORDER — GLIPIZIDE 10 MG PO TABS
10.0000 mg | ORAL_TABLET | Freq: Two times a day (BID) | ORAL | Status: DC
  Filled 2023-01-28: qty 1

## 2023-01-28 MED ORDER — ACARBOSE 25 MG PO TABS
100.0000 mg | ORAL_TABLET | Freq: Three times a day (TID) | ORAL | Status: DC
  Filled 2023-01-28 (×2): qty 4

## 2023-01-28 MED ORDER — GNP CALCIUM 1200 1200-1000 MG-UNIT PO CHEW: 1200.0000 mg | CHEWABLE_TABLET | Freq: Every day | ORAL | Status: DC

## 2023-01-28 MED ORDER — ONDANSETRON HCL 4 MG/2ML IJ SOLN
4.0000 mg | Freq: Once | INTRAMUSCULAR | Status: AC
  Administered 2023-01-28: 4 mg via INTRAVENOUS
  Filled 2023-01-28: qty 2

## 2023-01-28 MED ORDER — SODIUM CHLORIDE 0.9 % IV BOLUS
1000.0000 mL | Freq: Once | INTRAVENOUS | Status: AC
  Administered 2023-01-28: 1000 mL via INTRAVENOUS

## 2023-01-28 MED ORDER — ONDANSETRON HCL 4 MG/2ML IJ SOLN: 4.0000 mg | Freq: Four times a day (QID) | INTRAMUSCULAR | Status: DC | PRN

## 2023-01-28 MED ORDER — EMPAGLIFLOZIN 25 MG PO TABS
25.0000 mg | ORAL_TABLET | Freq: Every morning | ORAL | Status: DC
  Filled 2023-01-28: qty 1

## 2023-01-28 MED ORDER — OYSTER SHELL CALCIUM/D3 500-5 MG-MCG PO TABS
1.0000 | ORAL_TABLET | Freq: Every day | ORAL | Status: DC
  Administered 2023-01-29 – 2023-01-31 (×3): 1 via ORAL
  Filled 2023-01-28 (×3): qty 1

## 2023-01-28 MED ORDER — ENOXAPARIN SODIUM 40 MG/0.4ML IJ SOSY
40.0000 mg | PREFILLED_SYRINGE | INTRAMUSCULAR | Status: DC
  Administered 2023-01-29 – 2023-01-31 (×3): 40 mg via SUBCUTANEOUS
  Filled 2023-01-28 (×3): qty 0.4

## 2023-01-28 MED ORDER — NYSTATIN 100000 UNIT/GM EX POWD
1.0000 | Freq: Two times a day (BID) | CUTANEOUS | Status: DC
  Administered 2023-01-29 – 2023-01-31 (×5): 1 via TOPICAL
  Filled 2023-01-28: qty 15

## 2023-01-28 MED ORDER — LORAZEPAM 2 MG/ML IJ SOLN
0.5000 mg | Freq: Once | INTRAMUSCULAR | Status: AC
  Administered 2023-01-28: 0.5 mg via INTRAVENOUS
  Filled 2023-01-28: qty 1

## 2023-01-28 MED ORDER — ONDANSETRON HCL 4 MG PO TABS: 4.0000 mg | ORAL_TABLET | Freq: Four times a day (QID) | ORAL | Status: DC | PRN

## 2023-01-28 MED ORDER — INSULIN GLARGINE-YFGN 100 UNIT/ML ~~LOC~~ SOLN
40.0000 [IU] | Freq: Every day | SUBCUTANEOUS | Status: DC
  Administered 2023-01-29: 40 [IU] via SUBCUTANEOUS
  Filled 2023-01-28: qty 0.4

## 2023-01-28 MED ORDER — SODIUM CHLORIDE 0.9 % IV SOLN: INTRAVENOUS | Status: DC

## 2023-01-28 NOTE — Assessment & Plan Note (Addendum)
-   The patient will be admitted to a medical observation bed. - Will obtain acute viral hepatitis panel. - Will avoid hepatotoxins - She will be hydrated with IV normal saline. - We will follow daily LFTs. - The patient will be placed on as needed antiemetics. - GI consult to be obtained. - I notified Dr. Allegra Lai about the patient.

## 2023-01-28 NOTE — ED Provider Notes (Signed)
-----------------------------------------   10:57 PM on 01/28/2023 ----------------------------------------- Patient care assumed from [facility].  Patient's MRCP and ultrasound have resulted showing no significant abnormality.  Suspect given the patient's elevated white blood cell count elevated LFTs possibly more of a viral gastroenteritis that possibly be something like hepatitis A which could cause liver dysfunction as well as nausea vomiting.  However given the patient's initial dehydration and elevated LFTs white blood cell count will admit to the hospital service for further workup and treatment.   Minna Antis, MD 01/28/23 2258

## 2023-01-28 NOTE — ED Notes (Signed)
Pt gone to MRI 

## 2023-01-28 NOTE — Assessment & Plan Note (Signed)
-   We will hold off lisinopril HCT given acute kidney injury. - We will place the patient on as needed IV labetalol.

## 2023-01-28 NOTE — Assessment & Plan Note (Addendum)
-   The is acute kidney injury superimposed on stage IIIa chronic kidney disease. - The patient will be hydrated with IV normal saline. - We will avoid nephrotoxins. - Will follow BMP. - This could be part of hepatorenal syndrome. - It is likely prerenal due to volume depletion and dehydration from recurrent vomiting.

## 2023-01-28 NOTE — H&P (Signed)
[place]   PATIENT NAME: [name] [name]    MR#:  [id number]  DATE OF BIRTH:  1946-02-22  DATE OF ADMISSION:  01/28/2023  PRIMARY CARE PHYSICIAN: Sherron Monday, MD   Patient is coming from: Home  REQUESTING/REFERRING PHYSICIAN: Minna Antis, MD  CHIEF COMPLAINT:   Chief Complaint  Patient presents with   Abdominal Pain    HISTORY OF PRESENT ILLNESS:  Saree C Timperley is a 77 y.o. female with medical history significant for type 2 diabetes mellitus, hypertension and CVA/TIA as well as osteoarthritis and stage IIIa chronic kidney disease, who presented to the ER with acute onset of epigastric abdominal pain with associated recurrent vomiting that started earlier today.  No bilious vomitus or hematemesis.  She denies any diarrhea or melena or bright red bleeding per rectum.  No rhinorrhea or nasal congestion or sore throat.  No chest pain or palpitations.  No dyspnea or cough or wheezing.  She denies any fever or chills.  No dysuria, oliguria or hematuria or flank pain.  ED Course: When she came to the ER, vital signs were within normal.  Labs revealed an elevated blood glucose of 291, elevated BUN of 46 and creatinine 1.59 above previous levels, significantly elevated alk phos of 499 and AST of 692 with ALT of 311 and total protein was 9.4 with total bili of 2.9 and albumin was 3.9.  Serum lipase was 59.  CBC showed significant leukocytosis of 24.1.  UA showed more than 500 glucose and 5 ketones and was otherwise unremarkable. EKG as reviewed by me : Showed sinus tachycardia with a rate of 117. Imaging: Abdominal pelvic CT scan revealed the following: Subtle area of fold thickening or wall thickening along a short segment loop of jejunum in the left midabdomen with some adjacent fat stranding. Please correlate for focal enteritis. If needed a follow up IV contrast CT may be useful to further delineate the walls of the bowel.   Colonic diverticulosis.  No obstruction or  free air.   No obstructing renal stone.   Previous cholecystectomy with a dilated biliary tree but this was seen on the study of 2015.  Right upper quadrant ultrasound revealed a stable biliary dilatation postcholecystectomy and unremarkable sonographic appearance of the liver.  MRCP showed the following: 1. Status post cholecystectomy. Severe, predominantly extrahepatic biliary ductal dilatation, the common bile duct measuring up to 2.2 cm in caliber although tapering smoothly to the ampulla. No calculus or other obstructing etiology identified. This is generally greater than expected degree of postoperative biliary ductal dilatation, although this may nonetheless reflect benign postoperative biliary ductal dilatation. Consider nuclear scintigraphic HIDA or ERCP to further assess for patency of the common bile duct if there is clinical concern for obstruction. 2. Descending and sigmoid diverticulosis.  The patient was given 2 L bolus of IV normal saline, 4 mg of IV Zofran, 0.5 mg of IV Ativan.  She will be admitted to a medical observation bed for further evaluation and management.  PAST MEDICAL HISTORY:   Past Medical History:  Diagnosis Date   Arthritis    Diabetes mellitus without complication (HCC)    Hypertension    Stroke (HCC)    "light stoke" No residual deficits    PAST SURGICAL HISTORY:   Past Surgical History:  Procedure Laterality Date   CHOLECYSTECTOMY     HIP SURGERY     JOINT REPLACEMENT     left hip   TOTAL HIP ARTHROPLASTY Right 05/05/2017  Procedure: TOTAL HIP ARTHROPLASTY ANTERIOR APPROACH;  Surgeon: Lyndle Herrlich, MD;  Location: [facility] ORS;  Service: Orthopedics;  Laterality: Right;    SOCIAL HISTORY:   Social History   Tobacco Use   Smoking status: Former    Types: Cigarettes    Quit date: 04/28/2012    Years since quitting: 10.7   Smokeless tobacco: Never  Substance Use Topics   Alcohol use: No    FAMILY HISTORY:   Family History   Problem Relation Age of Onset   Heart disease Mother    Alcohol abuse Father    Breast cancer Neg Hx     DRUG ALLERGIES:   Allergies  Allergen Reactions   Cortisone     Elevates blood sugar   Gabapentin     Kidney doctor told me not to take   Nsaids     Kidney doctor told me not to take    REVIEW OF SYSTEMS:   ROS As per history of present illness. All pertinent systems were reviewed above. Constitutional, HEENT, cardiovascular, respiratory, GI, GU, musculoskeletal, neuro, psychiatric, endocrine, integumentary and hematologic systems were reviewed and are otherwise negative/unremarkable except for positive findings mentioned above in the HPI.   MEDICATIONS AT HOME:   Prior to Admission medications   Medication Sig Start Date End Date Taking? Authorizing Provider  acarbose (PRECOSE) 100 MG tablet Take 100 mg by mouth 3 (three) times daily with meals.    Yes [provider]  acetaminophen (TYLENOL) 325 MG tablet Take 2 tablets (650 mg total) by mouth every 6 (six) hours as needed for mild pain (or Fever >/= 101). 05/07/17  Yes Lyndle Herrlich, MD  atorvastatin (LIPITOR) 20 MG tablet Take 20 mg by mouth daily.    Yes [provider]  Continuous Glucose Sensor (FREESTYLE LIBRE 2 SENSOR) MISC Apply topically. 01/08/23  Yes [provider]  glipiZIDE (GLUCOTROL) 10 MG tablet Take 10 mg by mouth 2 (two) times daily before a meal. 06/14/15  Yes [provider]  JARDIANCE 25 MG TABS tablet Take 25 mg by mouth every morning.   Yes [provider]  lisinopril-hydrochlorothiazide (PRINZIDE,ZESTORETIC) 20-12.5 MG tablet Take 1 tablet by mouth daily.   Yes [provider]  metFORMIN (GLUCOPHAGE-XR) 500 MG 24 hr tablet Take 500 mg by mouth daily.   Yes [provider]  nystatin (MYCOSTATIN/NYSTOP) powder Apply 1 Application topically 2 (two) times daily. 01/21/23  Yes [provider]  OZEMPIC, 0.25 OR 0.5 MG/DOSE, 2 MG/3ML  SOPN Inject 0.25 mg into the skin once a week. 01/21/23  Yes [provider]  TRESIBA FLEXTOUCH 200 UNIT/ML FlexTouch Pen Inject 40 Units into the skin daily. 01/09/23  Yes [provider]  triamcinolone cream (KENALOG) 0.1 % Apply 1 Application topically 3 (three) times daily. 01/21/23  Yes [provider]  aspirin 325 MG tablet Take 1 tablet (325 mg total) by mouth 2 (two) times daily. Patient not taking: Reported on 01/31/2022 05/07/17   Lyndle Herrlich, MD  Calcium Carbonate-Vit D-[facility] CALCIUM 1200) 1200-1000 MG-UNIT CHEW Chew 1,200 mg by mouth daily with breakfast. Take in combination with vitamin D and magnesium. 08/18/18 02/14/19  Delano Metz, MD      VITAL SIGNS:  Blood pressure (!) 140/71, pulse (!) 116, temperature 98.4 F (36.9 C), temperature source Oral, resp. rate 18, SpO2 98 %.  PHYSICAL EXAMINATION:  Physical Exam  GENERAL:  77 y.o.-year-old female patient lying in the bed with no acute distress.  EYES:  Pupils equal, round, reactive to light and accommodation. No scleral icterus. Extraocular muscles intact.  HEENT: Head atraumatic, normocephalic. Oropharynx and nasopharynx clear.  NECK:  Supple, no jugular venous distention. No thyroid enlargement, no tenderness.  LUNGS: Normal breath sounds bilaterally, no wheezing, rales,rhonchi or crepitation. No use of accessory muscles of respiration.  CARDIOVASCULAR: Regular rate and rhythm, S1, S2 normal. No murmurs, rubs, or gallops.  ABDOMEN: Soft, nondistended, with right upper quadrant tenderness without rebound/guarding or rigidity. Bowel sounds present. No organomegaly or mass.  EXTREMITIES: No pedal edema, cyanosis, or clubbing.  NEUROLOGIC: Cranial nerves II through XII are intact. Muscle strength 5/5 in all extremities. Sensation intact. Gait not checked.  PSYCHIATRIC: The patient is alert and oriented x 3.  Normal affect and good eye contact. SKIN: No obvious rash, lesion, or ulcer.    LABORATORY PANEL:   CBC Recent Labs  Lab 01/28/23 1458  WBC 24.1*  HGB 13.1  HCT 41.2  PLT 324   ------------------------------------------------------------------------------------------------------------------  Chemistries  Recent Labs  Lab 01/28/23 1458  NA 138  K 4.5  CL 103  CO2 22  GLUCOSE 291*  BUN 46*  CREATININE 1.59*  CALCIUM 9.5  AST 696*  ALT 311*  ALKPHOS 499*  BILITOT 2.9*   ------------------------------------------------------------------------------------------------------------------  Cardiac Enzymes No results for input(s): "TROPONINI" in the last 168 hours. ------------------------------------------------------------------------------------------------------------------  RADIOLOGY:  MR ABDOMEN MRCP WO CONTRAST  Result Date: 01/28/2023 CLINICAL DATA:  Right upper quadrant abdominal pain, status post cholecystectomy EXAM: MRI ABDOMEN WITHOUT CONTRAST  (INCLUDING MRCP) TECHNIQUE: Multiplanar multisequence MR imaging of the abdomen was performed. Heavily T2-weighted images of the biliary and pancreatic ducts were obtained, and three-dimensional MRCP images were rendered by post processing. COMPARISON:  CT abdomen pelvis, 01/28/2023 MR abdomen, 12/24/2013 FINDINGS: Lower chest: No acute abnormality. Hepatobiliary: No focal liver abnormality is seen. Status post cholecystectomy. Severe, predominantly extrahepatic biliary ductal dilatation, the common bile duct measuring up to 2.2 cm in caliber although tapering smoothly to the ampulla (series 3, image 22). No calculus or other obstructing etiology identified. Pancreas: Unremarkable. No pancreatic ductal dilatation or surrounding inflammatory changes. Spleen: Normal in size without significant abnormality. Adrenals/Urinary Tract: Adrenal glands are unremarkable. Kidneys are normal, without obvious renal calculi, solid lesion, or hydronephrosis. Stomach/Bowel: Stomach is within normal limits. No evidence of bowel  wall thickening, distention, or inflammatory changes. Descending and sigmoid diverticulosis. Vascular/Lymphatic: No significant vascular findings are present. No enlarged abdominal lymph nodes. Other: No abdominal wall hernia or abnormality. No ascites. Musculoskeletal: No acute or significant osseous findings. IMPRESSION: 1. Status post cholecystectomy. Severe, predominantly extrahepatic biliary ductal dilatation, the common bile duct measuring up to 2.2 cm in caliber although tapering smoothly to the ampulla. No calculus or other obstructing etiology identified. This is generally greater than expected degree of postoperative biliary ductal dilatation, although this may nonetheless reflect benign postoperative biliary ductal dilatation. Consider nuclear scintigraphic HIDA or ERCP to further assess for patency of the common bile duct if there is clinical concern for obstruction. 2. Descending and sigmoid diverticulosis. Electronically Signed   By: Jearld Lesch M.D.   On: 01/28/2023 21:44   MR 3D Recon At Scanner  Result Date: 01/28/2023 CLINICAL DATA:  Right upper quadrant abdominal pain, status post cholecystectomy EXAM: MRI ABDOMEN WITHOUT CONTRAST  (INCLUDING MRCP) TECHNIQUE: Multiplanar multisequence MR imaging of the abdomen was performed. Heavily T2-weighted images of the biliary and pancreatic ducts were obtained, and three-dimensional MRCP images were rendered by post processing. COMPARISON:  CT abdomen pelvis, 01/28/2023 MR  abdomen, 12/24/2013 FINDINGS: Lower chest: No acute abnormality. Hepatobiliary: No focal liver abnormality is seen. Status post cholecystectomy. Severe, predominantly extrahepatic biliary ductal dilatation, the common bile duct measuring up to 2.2 cm in caliber although tapering smoothly to the ampulla (series 3, image 22). No calculus or other obstructing etiology identified. Pancreas: Unremarkable. No pancreatic ductal dilatation or surrounding inflammatory changes. Spleen: Normal  in size without significant abnormality. Adrenals/Urinary Tract: Adrenal glands are unremarkable. Kidneys are normal, without obvious renal calculi, solid lesion, or hydronephrosis. Stomach/Bowel: Stomach is within normal limits. No evidence of bowel wall thickening, distention, or inflammatory changes. Descending and sigmoid diverticulosis. Vascular/Lymphatic: No significant vascular findings are present. No enlarged abdominal lymph nodes. Other: No abdominal wall hernia or abnormality. No ascites. Musculoskeletal: No acute or significant osseous findings. IMPRESSION: 1. Status post cholecystectomy. Severe, predominantly extrahepatic biliary ductal dilatation, the common bile duct measuring up to 2.2 cm in caliber although tapering smoothly to the ampulla. No calculus or other obstructing etiology identified. This is generally greater than expected degree of postoperative biliary ductal dilatation, although this may nonetheless reflect benign postoperative biliary ductal dilatation. Consider nuclear scintigraphic HIDA or ERCP to further assess for patency of the common bile duct if there is clinical concern for obstruction. 2. Descending and sigmoid diverticulosis. Electronically Signed   By: Jearld Lesch M.D.   On: 01/28/2023 21:44   US Abdomen Limited RUQ (LIVER/GB)  Result Date: 01/28/2023 CLINICAL DATA:  Abdominal pain. EXAM: ULTRASOUND ABDOMEN LIMITED RIGHT UPPER QUADRANT COMPARISON:  CT earlier today, concurrent MRCP FINDINGS: Gallbladder: Surgically absent. Common bile duct: Diameter: Dilated at 18 mm, this is unchanged from the earlier CT. Liver: No focal lesion identified. Within normal limits in parenchymal echogenicity. Portal vein is patent on color Doppler imaging with normal direction of blood flow towards the liver. Other: No right upper quadrant ascites. IMPRESSION: 1. Stable biliary dilatation postcholecystectomy. 2. Unremarkable sonographic appearance of the liver. Electronically Signed   By:  Narda Rutherford M.D.   On: 01/28/2023 21:41   CT ABDOMEN PELVIS WO CONTRAST  Result Date: 01/28/2023 CLINICAL DATA:  Abdominal pain and vomiting. EXAM: CT ABDOMEN AND PELVIS WITHOUT CONTRAST TECHNIQUE: Multidetector CT imaging of the abdomen and pelvis was performed following the standard protocol without IV contrast. RADIATION DOSE REDUCTION: This exam was performed according to the departmental dose-optimization program which includes automated exposure control, adjustment of the mA and/or kV according to patient size and/or use of iterative reconstruction technique. COMPARISON:  Prior CT scan 12/23/2013 of the abdomen and pelvis. FINDINGS: Lower chest: There is some linear opacity seen along bases likely scar or atelectasis. No pleural effusion. Scattered bronchiectasis. Coronary artery calcifications are seen. Hepatobiliary: Previous cholecystectomy. Stable dilated common duct measuring up to 20 mm. Previously 18 mm. Normal tapering towards the pancreatic head. Preserved hepatic parenchyma on this noncontrast exam. Pancreas: Unremarkable. No pancreatic ductal dilatation or surrounding inflammatory changes. Spleen: Normal in size without focal abnormality. Adrenals/Urinary Tract: The adrenal glands are preserved. No abnormal calcifications are seen within either kidney nor along the course of either ureter. 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 Stomach/Bowel: Stomach is mildly distended with fluid. The large bowel is nondilated. Left-sided colonic diverticulosis greater than right. The appendix is not clearly seen in the right lower quadrant. No pericecal stranding or fluid. There is a loop of small bowel in the left midabdomen which has some mild wall thickening and there  is some adjacent mesenteric stranding as seen on axial series 2, image 48 and coronal series 5, image 38. No dilatation. This could  represent any area of enteritis. There are a few small mesenteric nodes. Vascular/Lymphatic: Normal caliber aorta and IVC with mild vascular calcifications. No specific abnormal lymph node enlargement seen in the abdomen and pelvis. Reproductive: Uterus and bilateral adnexa are unremarkable. Other: No free air or free fluid. 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 Healed traumatic deformities along the left pubic bone. Moderate degenerative changes along the lumbar spine. Transitional lumbosacral segment. Trace listhesis of L3 on L4. IMPRESSION: Subtle area of fold thickening or wall thickening along a short segment loop of jejunum in the left midabdomen with some adjacent fat stranding. Please correlate for focal enteritis. If needed a follow up IV contrast CT may be useful to further delineate the walls of the bowel. Colonic diverticulosis.  No obstruction or free air. No obstructing renal stone. Previous cholecystectomy with a dilated biliary tree but this was seen on the study of 2015. Please correlate with clinical history Electronically Signed   By: Karen Kays M.D.   On: 01/28/2023 17:25      IMPRESSION AND PLAN:  Assessment and Plan: * Acute hepatitis - The patient will be admitted to a medical observation bed. - Will obtain acute viral hepatitis panel. - Will avoid hepatotoxins - She will be hydrated with IV normal saline. - We will follow daily LFTs. - The patient will be placed on as needed antiemetics. - GI consult to be obtained. - I notified Dr. Allegra Lai about the patient.  Acute kidney injury superimposed on chronic kidney disease (HCC) - The is acute kidney injury superimposed on stage IIIa chronic kidney disease. - The patient will be hydrated with IV normal saline. - We will avoid nephrotoxins. - Will follow BMP. - This could be part of hepatorenal syndrome. - It is likely prerenal due to volume depletion and dehydration from  recurrent vomiting.  Uncontrolled type 2 diabetes mellitus with hyperglycemia, with long-term current use of insulin (HCC) - The patient be placed on supplemental coverage with NovoLog. - We will continue basal coverage as well as oral antidiabetics.  Essential hypertension - We will hold off lisinopril HCT given acute kidney injury. - We will place the patient on as needed IV labetalol.  Dyslipidemia - We will hold off statin for be given elevated LFTs.   DVT prophylaxis: Lovenox.  Advanced Care Planning:  Code Status: full code.  Family Communication:  The plan of care was discussed in details with the patient (and family). I answered all questions. The patient agreed to proceed with the above mentioned plan. Further management will depend upon hospital course. Disposition Plan: Back to previous home environment Consults called: GI consult All the records are reviewed and case discussed with ED provider.  Status is: Observation  I certify that at the time of admission, it is my clinical judgment that the patient will require hospital care extending less than 2 midnights.                            Dispo: The patient is from: Home              Anticipated d/c is to: Home              Patient currently is not medically stable to d/c.  Difficult to place patient: No  Hannah Beat M.D on 01/28/2023 at 11:27 PM  [facility]   From 7 PM-7 AM, contact night-coverage [email or web address]  CC: Primary care physician; Sherron Monday, MD

## 2023-01-28 NOTE — Assessment & Plan Note (Signed)
-   The patient be placed on supplemental coverage with NovoLog. - We will continue basal coverage as well as oral antidiabetics.

## 2023-01-28 NOTE — ED Triage Notes (Addendum)
Patient to ED via POV for abd pain with vomiting. Patient states it started today. States this occurred last Friday as well. Hx of gallbladder removed approx 5 years ago.

## 2023-01-28 NOTE — ED Notes (Signed)
MRI called for patient questions

## 2023-01-28 NOTE — Assessment & Plan Note (Signed)
-   We will hold off statin for be given elevated LFTs.

## 2023-01-28 NOTE — ED Provider Notes (Signed)
[facility] Provider Note    Event Date/Time   First MD Initiated Contact with Patient 01/28/23 1544     (approximate)   History   Abdominal Pain   HPI  [name] [name] is a 77 y.o. female with history of diabetes type 2, osteoarthritis, CVA, hypertension and as listed in EMR presents to the emergency department for treatment and evaluation of epigastric abdominal pain.  Symptoms started earlier today.  She has had multiple episodes of vomiting.  No diarrhea or fever.  No cough, congestion. Marland Kitchen      Physical Exam   Triage Vital Signs: ED Triage Vitals [01/28/23 1457]  Enc Vitals Group     BP 103/68     Pulse Rate 96     Resp 18     Temp 98.4 F (36.9 C)     Temp Source Oral     SpO2 100 %     Weight      Height      Head Circumference      Peak Flow      Pain Score 10     Pain Loc      Pain Edu?      Excl. in GC?     Most recent vital signs: Vitals:   01/28/23 1801 01/28/23 1901  BP: 132/72 (!) 140/71  Pulse: 100 (!) 116  Resp: 18 18  Temp:    SpO2: 97% 98%    General: Awake, no distress.  CV:  Good peripheral perfusion.  Resp:  Normal effort.  Abd:  No distention.  Other:  Skin breakdown and erythema in skin fold of stomach overlying pubis with white, malodorous exudate. Tenderness in epigastric area on palpation.    ED Results / Procedures / Treatments   Labs (all labs ordered are listed, but only abnormal results are displayed) Labs Reviewed  LIPASE, BLOOD - Abnormal; Notable for the following components:      Result Value   Lipase 59 (*)    All other components within normal limits  COMPREHENSIVE METABOLIC PANEL - Abnormal; Notable for the following components:   Glucose, Bld 291 (*)    BUN 46 (*)    Creatinine, Ser 1.59 (*)    Total Protein 9.4 (*)    AST 696 (*)    ALT 311 (*)    Alkaline Phosphatase 499 (*)    Total Bilirubin 2.9 (*)    GFR, Estimated 34 (*)    All other components within normal limits  CBC  - Abnormal; Notable for the following components:   WBC 24.1 (*)    All other components within normal limits  URINALYSIS, ROUTINE W REFLEX MICROSCOPIC - Abnormal; Notable for the following components:   Color, Urine YELLOW (*)    APPearance HAZY (*)    Glucose, UA >=500 (*)    Ketones, ur 5 (*)    All other components within normal limits  CBG MONITORING, ED - Abnormal; Notable for the following components:   Glucose-Capillary 256 (*)    All other components within normal limits     EKG  Sinus tachycardia with a rate of 117.   RADIOLOGY  Image and radiology report reviewed and interpreted by me. Radiology report consistent with the same.  CT of the abdomen and pelvis without contrast shows a subtle area of folded thickening or wall thickening along a short segment loop of the jejunum in the left mid abdomen with some adjacent fat stranding.  Correlation  for focal enteritis is recommended.  No diverticulosis.  No obstruction or free air.  No obstructing renal stone.  Prior cholecystectomy with a dilated biliary tree.  PROCEDURES:  Critical Care performed: No  Procedures   MEDICATIONS ORDERED IN ED:  Medications  sodium chloride 0.9 % bolus 1,000 mL (0 mLs Intravenous Stopped 01/28/23 1801)  ondansetron (ZOFRAN) injection 4 mg (4 mg Intravenous Given 01/28/23 1643)     IMPRESSION / MDM / ASSESSMENT AND PLAN / ED COURSE   I have reviewed the triage note.  Differential diagnosis includes, but is not limited to, intra-abdominal infection, peptic ulcer, choledocholithiasis, colitis, enteritis  Patient's presentation is most consistent with acute presentation with potential threat to life or bodily function.  77 year old female presenting with epigastric abdominal pain with onset of nausea and vomiting today.  No fever.  See HPI for further details.  Plan will be to get labs and CT of the abdomen and pelvis.  She will be given some fluids and Zofran.  Clinical Course as of  01/28/23 1934  Tue Jan 28, 2023  1750 Multiple lab abnormalities.  Leukocytosis at 24.1, lipase 59, BUN of 46 with a 1.59 creatinine and a GFR of 34.  AST of 696 with an ALT of 311 and alkaline phosphatase 499.  Awaiting results of the CT and urinalysis. [CT]  [id number] Surgery paged for consult regarding CT findings.  [CT]  [id number] Dr. Maia Plan with general surgery does not feel this is a surgical abdomen and recommends GI consult or admission to medicine. Hospitalist consult paged.  [CT]  1926 Patient's pain and nausea have resolved and she has not  vomited while here in the emergency department. Case discussed with Dr. Arville Care, hospitalist, who requests that the patient undergo ultrasound to rule out biliary duct dilatation and/or retained stone.  He also request that gastroenterology is aware of the patient and agrees to follow her during admission.  Care relinquished to Dr.Paduchowski who will follow-up on the ultrasound and MRCP and discuss with GI specialist. [CT]    Clinical Course User Index [CT] Kensie Susman B, FNP     FINAL CLINICAL IMPRESSION(S) / ED DIAGNOSES   Final diagnoses:  Epigastric pain  Elevated liver enzymes  Leukocytosis, unspecified type     Rx / DC Orders   ED Discharge Orders     None        Note:  This document was prepared using Dragon voice recognition software and may include unintentional dictation errors.   Chinita Pester, FNP 01/28/23 1934    Minna Antis, MD 01/28/23 2144

## 2023-01-29 DIAGNOSIS — B179 Acute viral hepatitis, unspecified: Secondary | ICD-10-CM | POA: Diagnosis not present

## 2023-01-29 LAB — HEMOGLOBIN A1C
Hgb A1c MFr Bld: 9.3 % — ABNORMAL HIGH (ref 4.8–5.6)
Mean Plasma Glucose: 220.21 mg/dL

## 2023-01-29 LAB — GLUCOSE, CAPILLARY
Glucose-Capillary: 92 mg/dL (ref 70–99)
Glucose-Capillary: 131 mg/dL — ABNORMAL HIGH (ref 70–99)
Glucose-Capillary: 65 mg/dL — ABNORMAL LOW (ref 70–99)
Glucose-Capillary: 80 mg/dL (ref 70–99)
Glucose-Capillary: 121 mg/dL — ABNORMAL HIGH (ref 70–99)

## 2023-01-29 LAB — CBC
HCT: 34.5 % — ABNORMAL LOW (ref 36.0–46.0)
Hemoglobin: 11 g/dL — ABNORMAL LOW (ref 12.0–15.0)
MCH: 28.9 pg (ref 26.0–34.0)
MCHC: 31.9 g/dL (ref 30.0–36.0)
MCV: 90.6 fL (ref 80.0–100.0)
Platelets: 266 10*3/uL (ref 150–400)
RBC: 3.81 MIL/uL — ABNORMAL LOW (ref 3.87–5.11)
RDW: 13.8 % (ref 11.5–15.5)
WBC: 16.3 10*3/uL — ABNORMAL HIGH (ref 4.0–10.5)
nRBC: 0 % (ref 0.0–0.2)

## 2023-01-29 LAB — COMPREHENSIVE METABOLIC PANEL
ALT: 452 U/L — ABNORMAL HIGH (ref 0–44)
AST: 584 U/L — ABNORMAL HIGH (ref 15–41)
Albumin: 3.1 g/dL — ABNORMAL LOW (ref 3.5–5.0)
Alkaline Phosphatase: 463 U/L — ABNORMAL HIGH (ref 38–126)
Anion gap: 4 — ABNORMAL LOW (ref 5–15)
BUN: 40 mg/dL — ABNORMAL HIGH (ref 8–23)
CO2: 24 mmol/L (ref 22–32)
Calcium: 8.3 mg/dL — ABNORMAL LOW (ref 8.9–10.3)
Chloride: 112 mmol/L — ABNORMAL HIGH (ref 98–111)
Creatinine, Ser: 1.53 mg/dL — ABNORMAL HIGH (ref 0.44–1.00)
GFR, Estimated: 35 mL/min — ABNORMAL LOW (ref >60)
Glucose, Bld: 135 mg/dL — ABNORMAL HIGH (ref 70–99)
Potassium: 3.7 mmol/L (ref 3.5–5.1)
Sodium: 140 mmol/L (ref 135–145)
Total Bilirubin: 3.7 mg/dL — ABNORMAL HIGH (ref 0.3–1.2)
Total Protein: 7.8 g/dL (ref 6.5–8.1)

## 2023-01-29 LAB — PHOSPHORUS: Phosphorus: 3.5 mg/dL (ref 2.5–4.6)

## 2023-01-29 LAB — HEPATITIS PANEL, ACUTE
HCV Ab: NONREACTIVE
Hep A IgM: NONREACTIVE
Hep B C IgM: NONREACTIVE
Hepatitis B Surface Ag: NONREACTIVE

## 2023-01-29 LAB — MAGNESIUM: Magnesium: 2.2 mg/dL (ref 1.7–2.4)

## 2023-01-29 MED ORDER — INSULIN GLARGINE-YFGN 100 UNIT/ML ~~LOC~~ SOLN
10.0000 [IU] | Freq: Every day | SUBCUTANEOUS | Status: DC
  Administered 2023-01-30: 10 [IU] via SUBCUTANEOUS
  Filled 2023-01-29: qty 0.1

## 2023-01-29 MED ORDER — INSULIN ASPART 100 UNIT/ML IJ SOLN
0.0000 [IU] | Freq: Three times a day (TID) | INTRAMUSCULAR | Status: DC
  Administered 2023-01-29 – 2023-01-30 (×3): 2 [IU] via SUBCUTANEOUS
  Administered 2023-01-31 (×2): 3 [IU] via SUBCUTANEOUS
  Filled 2023-01-29 (×5): qty 1

## 2023-01-29 MED ORDER — ACARBOSE 25 MG PO TABS
100.0000 mg | ORAL_TABLET | Freq: Three times a day (TID) | ORAL | Status: DC
  Filled 2023-01-29: qty 4

## 2023-01-29 NOTE — Consult Note (Signed)
GI Inpatient Consult Note  Reason for Consult: Elevated LFTs, abnormal MRCP, epigastric abd pain   Attending Requesting Consult: Dr. Valente David  History of Present Illness: [name] [name] is a 77 y.o. female seen for evaluation of elevated LFTs, abnormal MRCP, and epigastric abdominal pain at the request of admitting hospitalist - Dr. Valente David. Patient has a PMH of HTN, OA, CKD Stage III, hx of TIA, hx of CVA, T2DM, and obesity (BMI 33). She presented to the [facility] ED yesterday afternoon for chief complaint of acute epigastric/LUQ abd pain with associated nausea and vomiting. Upon presentation to the ED, all vital signs were stable. Labs significant for leukocytosis (24.1K), lipase 59, BUN 46, serum creatinine 1.59, AST 696, ALT 311, alk phos 499, and total bilirubin 2.9. She had RUQ US showing dilated CBD 18 mm and s/p cholecystectomy state with unremarkable sonographic appearance of liver. CT abd/pelvis with contrast commented on enteritis. MRI/MRCP commented on severe, predominantly extrahepatic biliary ductal dilatation with CBD measuring up to 2.2 cm in caliber although tapering smoothly to ampulla. No calculous or obstructing etiology identified. She was given 2L bolus of IV normal saline, 4 mg IV Zofran, and 0.5 mg IV Ativan. She was admitted to medical observation bed. GI consulted for further evaluation and management.   Patient seen and examined this afternoon resting comfortably in hospital bed. No acute events overnight. She denies any current abdominal pain, nausea, or vomiting. She reports her abdominal pain seemed to start after eating bacon, eggs, ham, and toast. She reports she had many episodes of non-bloody and non-bilious emesis. Epigastric abdominal pain was very severe and seemed to radiate to her LUQ. It was described as spasm-like, cramping, and sharp. She had similar episode a week ago which only lasted a few hours and then symptoms resolved on their own. This started after  eating pork chops. She had her gallbladder removed April 2015. Repeat LFTs this morning showed AST 584, ALT 452, alk phos 463, and total bilirubin 3.7.    Past Medical History:  Past Medical History:  Diagnosis Date   Arthritis    Diabetes mellitus without complication (HCC)    Hypertension    Stroke (HCC)    "light stoke" No residual deficits    Problem List: Patient Active Problem List   Diagnosis Date Noted   Acute hepatitis 01/28/2023   Acute kidney injury superimposed on chronic kidney disease (HCC) 01/28/2023   Uncontrolled type 2 diabetes mellitus with hyperglycemia, with long-term current use of insulin (HCC) 01/28/2023   Dyslipidemia 01/28/2023   Essential hypertension 01/28/2023   Pain due to onychomycosis of toenails of both feet 01/31/2022   Vitamin D insufficiency 08/18/2018   Chronic pain disorder 07/27/2018   Pharmacologic therapy 07/27/2018   Disorder of skeletal system 07/27/2018   Problems influencing health status 07/27/2018   Lumbar facet syndrome 07/27/2018   Chronic low back pain (Secondary Area of Pain) (Bilateral) (R>L) w/o sciatica 07/27/2018   DDD (degenerative disc disease), lumbar 07/01/2018   Abnormal MRI, lumbar spine (06/27/2018) 07/01/2018   Chronic lower extremity pain (Primary Area of Pain) (Right) 07/01/2018   Lumbosacral radiculopathy at L5 (Right) 07/01/2018   Chronic lumbar radicular pain (L5 dermatome) (Right) 07/01/2018   Lumbar spondylosis 07/01/2018   Lumbar spondylosis with radiculopathy (Right) 07/01/2018   S/P total hip replacement (Right) (05/05/2018) 07/01/2018   Lumbar Grade 1 Anterolisthesis of L4 over L5 07/01/2018   Lumbar facet hypertrophy (Bilateral) 07/01/2018   Lumbar foraminal stenosis (Left: L2-3  severe) (Bilateral: L3-4, L4-5) (Right: L5-S1) 07/01/2018   Lumbar lateral recess stenosis (Bilateral: L4-5) (Right: L5-S1) 07/01/2018   Lumbar facet arthropathy 07/01/2018   Chronic low back pain (Secondary Area of Pain)  (Bilateral) w/ sciatica (Right) 12/15/2017   Hx of total hip arthroplasty (Right) 05/15/2017   Status post THR (total hip replacement) 05/05/2017   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) 03/03/2017   MGUS (monoclonal gammopathy of unknown significance) 11/22/2016   Diabetes mellitus type 2, uncomplicated (HCC) 11/08/2016    Past Surgical History: Past Surgical History:  Procedure Laterality Date   CHOLECYSTECTOMY     HIP SURGERY     JOINT REPLACEMENT     left hip   TOTAL HIP ARTHROPLASTY Right 05/05/2017   Procedure: TOTAL HIP ARTHROPLASTY ANTERIOR APPROACH;  Surgeon: Lyndle Herrlich, MD;  Location: [facility] ORS;  Service: Orthopedics;  Laterality: Right;    Allergies: Allergies  Allergen Reactions   Cortisone     Elevates blood sugar   Gabapentin     Kidney doctor told me not to take   Nsaids     Kidney doctor told me not to take    Home Medications: Medications Prior to Admission  Medication Sig Dispense Refill Last Dose   acarbose (PRECOSE) 100 MG tablet Take 100 mg by mouth 3 (three) times daily with meals.    Past Week   acetaminophen (TYLENOL) 325 MG tablet Take 2 tablets (650 mg total) by mouth every 6 (six) hours as needed for mild pain (or Fever >/= 101). 60 tablet 1 unk at unk   atorvastatin (LIPITOR) 20 MG tablet Take 20 mg by mouth daily.    Past Week   Continuous Glucose Sensor (FREESTYLE LIBRE 2 SENSOR) MISC Apply topically.   unk at unk   glipiZIDE (GLUCOTROL) 10 MG tablet Take 10 mg by mouth 2 (two) times daily before a meal.  0 Past Week   JARDIANCE 25 MG TABS tablet Take 25 mg by mouth every morning.   Past Week   lisinopril-hydrochlorothiazide (PRINZIDE,ZESTORETIC) 20-12.5 MG tablet Take 1 tablet by mouth daily.   Past Week   metFORMIN (GLUCOPHAGE-XR) 500 MG 24 hr tablet Take 500 mg by mouth daily.   Past Week   nystatin (MYCOSTATIN/NYSTOP) powder Apply 1 Application topically 2 (two) times daily.   Past Week   OZEMPIC, 0.25 OR 0.5  MG/DOSE, 2 MG/3ML SOPN Inject 0.25 mg into the skin once a week.   Past Week   TRESIBA FLEXTOUCH 200 UNIT/ML FlexTouch Pen Inject 40 Units into the skin daily.   Past Week   triamcinolone cream (KENALOG) 0.1 % Apply 1 Application topically 3 (three) times daily.   Past Week   aspirin 325 MG tablet Take 1 tablet (325 mg total) by mouth 2 (two) times daily. (Patient not taking: Reported on 01/31/2022) 60 tablet 0 Not Taking   Calcium Carbonate-Vit D-Min (GNP CALCIUM 1200) 1200-1000 MG-UNIT CHEW Chew 1,200 mg by mouth daily with breakfast. Take in combination with vitamin D and magnesium. 30 tablet 5    Home medication reconciliation was completed with the patient.   Scheduled Inpatient Medications:    calcium-vitamin D  1 tablet Oral Q breakfast   And   magnesium oxide  400 mg Oral Q breakfast   enoxaparin (LOVENOX) injection  40 mg Subcutaneous Q24H   insulin aspart  0-15 Units Subcutaneous TID WC   [START ON 01/30/2023] insulin glargine-yfgn  10 Units Subcutaneous Daily   nystatin  1 Application  Topical BID   triamcinolone cream  1 Application Topical TID    Continuous Inpatient Infusions:    sodium chloride 125 mL/hr at 01/29/23 1205    PRN Inpatient Medications:  magnesium hydroxide, ondansetron **OR** ondansetron (ZOFRAN) IV, traMADol, traZODone  Family History: family history includes Alcohol abuse in her father; Heart disease in her mother.  The patient's family history is negative for inflammatory bowel disorders, GI malignancy, or solid organ transplantation.  Social History:   reports that she quit smoking about 10 years ago. She has never used smokeless tobacco. She reports that she does not drink alcohol and does not use drugs. The patient denies ETOH, tobacco, or drug use.   Review of Systems: Constitutional: Weight is stable.  Eyes: No changes in vision. ENT: No oral lesions, sore throat.  GI: see HPI.  Heme/Lymph: No easy bruising.  CV: No chest pain.  GU: No  hematuria.  Integumentary: No rashes.  Neuro: No headaches.  Psych: No depression/anxiety.  Endocrine: No heat/cold intolerance.  Allergic/Immunologic: No urticaria.  Resp: No cough, SOB.  Musculoskeletal: No joint swelling.    Physical Examination: BP 104/60 (BP Location: Left Arm)   Pulse 78   Temp 98.1 F (36.7 C)   Resp 18   Ht 5\' 4"  (1.626 m)   Wt 87.6 kg   SpO2 96%   BMI 33.15 kg/m  Gen: NAD, alert and oriented x 4 HEENT: PEERLA, EOMI, Neck: supple, no JVD or thyromegaly Chest: CTA bilaterally, no wheezes, crackles, or other adventitious sounds CV: RRR, no m/g/c/r Abd: soft, ND, +BS in all four quadrants; tender to deep palpation in epigastric and LUQ, no RUQ TTP, no HSM, guarding, ridigity, or rebound tenderness Ext: no edema, well perfused with 2+ pulses, Skin: no rash or lesions noted Lymph: no LAD  Data: Lab Results  Component Value Date   WBC 16.3 (H) 01/29/2023   HGB 11.0 (L) 01/29/2023   HCT 34.5 (L) 01/29/2023   MCV 90.6 01/29/2023   PLT 266 01/29/2023   Recent Labs  Lab 01/28/23 1458 01/29/23 0533  HGB 13.1 11.0*   Lab Results  Component Value Date   NA 140 01/29/2023   K 3.7 01/29/2023   CL 112 (H) 01/29/2023   CO2 24 01/29/2023   BUN 40 (H) 01/29/2023   CREATININE 1.53 (H) 01/29/2023   Lab Results  Component Value Date   ALT 452 (H) 01/29/2023   AST 584 (H) 01/29/2023   ALKPHOS 463 (H) 01/29/2023   BILITOT 3.7 (H) 01/29/2023   No results for input(s): "APTT", "INR", "PTT" in the last 168 hours.  MRI/MRCP 01/28/2023: IMPRESSION: 1. Status post cholecystectomy. Severe, predominantly extrahepatic biliary ductal dilatation, the common bile duct measuring up to 2.2 cm in caliber although tapering smoothly to the ampulla. No calculus or other obstructing etiology identified. This is generally greater than expected degree of postoperative biliary ductal dilatation, although this may nonetheless reflect benign postoperative biliary ductal  dilatation. Consider nuclear scintigraphic HIDA or ERCP to further assess for patency of the common bile duct if there is clinical concern for obstruction. 2. Descending and sigmoid diverticulosis.  CT abd/pelvis with contrast 01/28/2023: IMPRESSION: Subtle area of fold thickening or wall thickening along a short segment loop of jejunum in the left midabdomen with some adjacent fat stranding. Please correlate for focal enteritis. If needed a follow up IV contrast CT may be useful to further delineate the walls of the bowel.   Colonic diverticulosis.  No obstruction or free air.  No obstructing renal stone.   Previous cholecystectomy with a dilated biliary tree but this was seen on the study of 2015. Please correlate with clinical history  RUQ [place] 01/28/2023: FINDINGS: Gallbladder:   Surgically absent.   Common bile duct:   Diameter: Dilated at 18 mm, this is unchanged from the earlier CT.   Liver:   No focal lesion identified. Within normal limits in parenchymal echogenicity. Portal vein is patent on color Doppler imaging with normal direction of blood flow towards the liver.   Other: No right upper quadrant ascites.   IMPRESSION: 1. Stable biliary dilatation postcholecystectomy. 2. Unremarkable sonographic appearance of the liver.  Assessment/Plan:  77 y/o AA female with a PMH of HTN, OA, CKD Stage III, hx of TIA, hx of CVA, T2DM, and obesity (BMI 33) presented to the [facility] ED yesterday afternoon for chief complaint of 1-day history of epigastric abdominal pain with associated nausea and vomiting.   Elevated LFTs - mixed pattern with evidence of cholestasis with total bilirubin 3.7, alk phos 463 and transaminitis with AST 584, ALT 452  CBD dilatation (2.2 cm) - MRCP commented on extrahepatic biliary dilatation with CBD 2.2 cm tapering smoothly to ampulla  Epigastric abdominal pain/nausea/vomiting - potentially biliary obstruction, acute gastroenteritis, etc  AKI on CKD  Stage III  Uncontrolled T2DM  Obesity   Recommendations:  - Continue supportive care with IV fluid hydration, antiemetics, and pain control prn - Reviewed cross-sectional imaging with patient in room today - No signs of altered mental status or fever or hepatic decompensation - Will proceed with HIDA scan to assess patency of CBD. If abnormal, will likely need ERCP with Dr. Servando Snare. HIDA scheduled for tomorrow at 12 PM.  - NPO after 6 AM tomorrow - Continue management of other medical comorbidities per primary team - Continue to trend LFTs. Daily CBC and CMP.  - Full liquid diet for now - GI following along with you   Thank you for the consult. Please call with questions or concerns.  Gilda Crease, PA-C [facility] [phone]

## 2023-01-29 NOTE — Progress Notes (Signed)
[facility] Progress Note  Patient: [name] [name]    ZOX:[id number]  DOA: 01/28/2023     Date of Service: the patient was seen and examined on 01/29/2023  Chief Complaint  Patient presents with   Abdominal Pain   Brief hospital course: [name] [name] is a 77 y.o. African-American female with medical history significant for type 2 diabetes mellitus, hypertension and CVA/TIA as well as osteoarthritis and stage IIIa chronic kidney disease, who presented to the ER with acute onset of epigastric abdominal pain with associated recurrent vomiting that started earlier on the day of admission.   ED w/up: Mild hyperglycemia, serum glucose 291, creatinine 1.59, elevated LFTs alk phos 499, AST 696, ALT 311, total bili 2.9, lipase 59 elevated.  Leukocytosis WBC 24.1 US Liver: Stable biliary dilatation postcholecystectomy. Unremarkable sonographic appearance of the liver. CT A/P: Subtle area of fold thickening or wall thickening along a short segment loop of jejunum in the left midabdomen with some adjacent fat stranding. Please correlate for focal enteritis. If needed a follow up IV contrast CT may be useful to further delineate the walls of the bowel. Colonic diverticulosis.  No obstruction or free air. No obstructing renal stone. Previous cholecystectomy with a dilated biliary tree but this was seen on the study of 2015. Please correlate with clinical history MRCP: Status post cholecystectomy. Severe, predominantly extrahepatic biliary ductal dilatation, the common bile duct measuring up to 2.2 cm in caliber although tapering smoothly to the ampulla. No calculus or other obstructing etiology identified. This is generally greater than expected degree of postoperative biliary ductal dilatation, although this may nonetheless reflect benign postoperative biliary ductal dilatation. Consider nuclear scintigraphic HIDA or ERCP to further assess for patency of the common bile duct if there is clinical  concern for obstruction. Descending and sigmoid diverticulosis.  Assessment and Plan:  # Acute hepatitis Elevated LFTs, continue to monitor Follow acute viral hepatitis panel Ultrasound, CT and MRCP reviewed as above Continue symptomatic treatment Continue IV fluid and follow liquid diet today GI consulted, recommended HIDA scan which will be done tomorrow a.m. Keep n.p.o. after midnight   # AKI, on CKDstage IIIa Baseline creatinine 1.1-1.3 Creatinine 1.59 on admission Continue IV fluid for hydration, monitor renal functions and urine output Avoid nephrotoxic medications, use renally dose medications  # Hypertension and hyperlipidemia Blood pressure soft Held Lipitor due to elevated LFTs Held lisinopril-hydrochlorothiazide due to AKI Monitor BP and titrate medications accordingly   # IDDM T2, hemoglobin A1c 9.3, uncontrolled diabetes Held home medications for now Continue NovoLog sliding scale, monitor CBG Continue diabetic diet  Body mass index is 33.15 kg/m.  Interventions:       Diet: Full liquid diet DVT Prophylaxis: Subcutaneous Lovenox   Advance goals of care discussion: Full code  Family Communication: family was present at bedside, at the time of interview.  The pt provided permission to discuss medical plan with the family. Opportunity was given to ask question and all questions were answered satisfactorily.   Disposition:  Pt is from Home, admitted with acute hepatitis, still has elevated LFTs, which precludes a safe discharge. Discharge to home, when cleared by GI, may need few days to stay in the hospital.  Subjective: No significant events overnight, in the morning patient was feeling fine, denies any nausea vomiting or diarrhea, no abdominal pain.  Physical Exam: General: NAD, lying comfortably Appear in no distress, affect appropriate Eyes: PERRLA ENT: Oral Mucosa Clear, moist  Neck: no JVD,  Cardiovascular: S1 and S2  Present, no Murmur,   Respiratory: good respiratory effort, Bilateral Air entry equal and Decreased, no Crackles, no wheezes Abdomen: Bowel Sound present, Soft and no tenderness,  Skin: no rashes Extremities: no Pedal edema, no calf tenderness Neurologic: without any new focal findings Gait not checked due to patient safety concerns  Vitals:   01/29/23 0207 01/29/23 0539 01/29/23 0627 01/29/23 0804  BP:  (!) 97/55 113/63 104/60  Pulse:  87 93 78  Resp:  18 18 18   Temp:  98.5 F (36.9 C) 98 F (36.7 C) 98.1 F (36.7 C)  TempSrc:  Oral    SpO2:  97% 96% 96%  Weight: 87.1 kg  87.6 kg   Height: 5\' 4"  (1.626 m)  5\' 4"  (1.626 m)     Intake/Output Summary (Last 24 hours) at 01/29/2023 1608 Last data filed at 01/29/2023 1436 Gross per 24 hour  Intake 2440 ml  Output --  Net 2440 ml   Filed Weights   01/29/23 0207 01/29/23 0627  Weight: 87.1 kg 87.6 kg    Data Reviewed: I have personally reviewed and interpreted daily labs, tele strips, imagings as discussed above. I reviewed all nursing notes, pharmacy notes, vitals, pertinent old records I have discussed plan of care as described above with RN and patient/family.  CBC: Recent Labs  Lab 01/28/23 1458 01/29/23 0533  WBC 24.1* 16.3*  HGB 13.1 11.0*  HCT 41.2 34.5*  MCV 90.4 90.6  PLT 324 266   Basic Metabolic Panel: Recent Labs  Lab 01/28/23 1458 01/29/23 0533  NA 138 140  K 4.5 3.7  CL 103 112*  CO2 22 24  GLUCOSE 291* 135*  BUN 46* 40*  CREATININE 1.59* 1.53*  CALCIUM 9.5 8.3*  MG  --  2.2  PHOS  --  3.5    Studies: MR ABDOMEN MRCP WO CONTRAST  Result Date: 01/28/2023 CLINICAL DATA:  Right upper quadrant abdominal pain, status post cholecystectomy EXAM: MRI ABDOMEN WITHOUT CONTRAST  (INCLUDING MRCP) TECHNIQUE: Multiplanar multisequence MR imaging of the abdomen was performed. Heavily T2-weighted images of the biliary and pancreatic ducts were obtained, and three-dimensional MRCP images were rendered by post processing.  COMPARISON:  CT abdomen pelvis, 01/28/2023 MR abdomen, 12/24/2013 FINDINGS: Lower chest: No acute abnormality. Hepatobiliary: No focal liver abnormality is seen. Status post cholecystectomy. Severe, predominantly extrahepatic biliary ductal dilatation, the common bile duct measuring up to 2.2 cm in caliber although tapering smoothly to the ampulla (series 3, image 22). No calculus or other obstructing etiology identified. Pancreas: Unremarkable. No pancreatic ductal dilatation or surrounding inflammatory changes. Spleen: Normal in size without significant abnormality. Adrenals/Urinary Tract: Adrenal glands are unremarkable. Kidneys are normal, without obvious renal calculi, solid lesion, or hydronephrosis. Stomach/Bowel: Stomach is within normal limits. No evidence of bowel wall thickening, distention, or inflammatory changes. Descending and sigmoid diverticulosis. Vascular/Lymphatic: No significant vascular findings are present. No enlarged abdominal lymph nodes. Other: No abdominal wall hernia or abnormality. No ascites. Musculoskeletal: No acute or significant osseous findings. IMPRESSION: 1. Status post cholecystectomy. Severe, predominantly extrahepatic biliary ductal dilatation, the common bile duct measuring up to 2.2 cm in caliber although tapering smoothly to the ampulla. No calculus or other obstructing etiology identified. This is generally greater than expected degree of postoperative biliary ductal dilatation, although this may nonetheless reflect benign postoperative biliary ductal dilatation. Consider nuclear scintigraphic HIDA or ERCP to further assess for patency of the common bile duct if there is clinical concern for obstruction. 2. Descending and sigmoid diverticulosis. Electronically Signed  By: Jearld Lesch M.D.   On: 01/28/2023 21:44   MR 3D Recon At Scanner  Result Date: 01/28/2023 CLINICAL DATA:  Right upper quadrant abdominal pain, status post cholecystectomy EXAM: MRI ABDOMEN WITHOUT  CONTRAST  (INCLUDING MRCP) TECHNIQUE: Multiplanar multisequence MR imaging of the abdomen was performed. Heavily T2-weighted images of the biliary and pancreatic ducts were obtained, and three-dimensional MRCP images were rendered by post processing. COMPARISON:  CT abdomen pelvis, 01/28/2023 MR abdomen, 12/24/2013 FINDINGS: Lower chest: No acute abnormality. Hepatobiliary: No focal liver abnormality is seen. Status post cholecystectomy. Severe, predominantly extrahepatic biliary ductal dilatation, the common bile duct measuring up to 2.2 cm in caliber although tapering smoothly to the ampulla (series 3, image 22). No calculus or other obstructing etiology identified. Pancreas: Unremarkable. No pancreatic ductal dilatation or surrounding inflammatory changes. Spleen: Normal in size without significant abnormality. Adrenals/Urinary Tract: Adrenal glands are unremarkable. Kidneys are normal, without obvious renal calculi, solid lesion, or hydronephrosis. Stomach/Bowel: Stomach is within normal limits. No evidence of bowel wall thickening, distention, or inflammatory changes. Descending and sigmoid diverticulosis. Vascular/Lymphatic: No significant vascular findings are present. No enlarged abdominal lymph nodes. Other: No abdominal wall hernia or abnormality. No ascites. Musculoskeletal: No acute or significant osseous findings. IMPRESSION: 1. Status post cholecystectomy. Severe, predominantly extrahepatic biliary ductal dilatation, the common bile duct measuring up to 2.2 cm in caliber although tapering smoothly to the ampulla. No calculus or other obstructing etiology identified. This is generally greater than expected degree of postoperative biliary ductal dilatation, although this may nonetheless reflect benign postoperative biliary ductal dilatation. Consider nuclear scintigraphic HIDA or ERCP to further assess for patency of the common bile duct if there is clinical concern for obstruction. 2. Descending and  sigmoid diverticulosis. Electronically Signed   By: Jearld Lesch M.D.   On: 01/28/2023 21:44   US Abdomen Limited RUQ (LIVER/GB)  Result Date: 01/28/2023 CLINICAL DATA:  Abdominal pain. EXAM: ULTRASOUND ABDOMEN LIMITED RIGHT UPPER QUADRANT COMPARISON:  CT earlier today, concurrent MRCP FINDINGS: Gallbladder: Surgically absent. Common bile duct: Diameter: Dilated at 18 mm, this is unchanged from the earlier CT. Liver: No focal lesion identified. Within normal limits in parenchymal echogenicity. Portal vein is patent on color Doppler imaging with normal direction of blood flow towards the liver. Other: No right upper quadrant ascites. IMPRESSION: 1. Stable biliary dilatation postcholecystectomy. 2. Unremarkable sonographic appearance of the liver. Electronically Signed   By: Narda Rutherford M.D.   On: 01/28/2023 21:41   CT ABDOMEN PELVIS WO CONTRAST  Result Date: 01/28/2023 CLINICAL DATA:  Abdominal pain and vomiting. EXAM: CT ABDOMEN AND PELVIS WITHOUT CONTRAST TECHNIQUE: Multidetector CT imaging of the abdomen and pelvis was performed following the standard protocol without IV contrast. RADIATION DOSE REDUCTION: This exam was performed according to the departmental dose-optimization program which includes automated exposure control, adjustment of the mA and/or kV according to patient size and/or use of iterative reconstruction technique. COMPARISON:  Prior CT scan 12/23/2013 of the abdomen and pelvis. FINDINGS: Lower chest: There is some linear opacity seen along bases likely scar or atelectasis. No pleural effusion. Scattered bronchiectasis. Coronary artery calcifications are seen. Hepatobiliary: Previous cholecystectomy. Stable dilated common duct measuring up to 20 mm. Previously 18 mm. Normal tapering towards the pancreatic head. Preserved hepatic parenchyma on this noncontrast exam. Pancreas: Unremarkable. No pancreatic ductal dilatation or surrounding inflammatory changes. Spleen: Normal in size  without focal abnormality. Adrenals/Urinary Tract: The adrenal glands are preserved. No abnormal calcifications are seen within either kidney nor along the course  of either ureter. 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 Stomach/Bowel: Stomach is mildly distended with fluid. The large bowel is nondilated. Left-sided colonic diverticulosis greater than right. The appendix is not clearly seen in the right lower quadrant. No pericecal stranding or fluid. There is a loop of small bowel in the left midabdomen which has some mild wall thickening and there is some adjacent mesenteric stranding as seen on axial series 2, image 48 and coronal series 5, image 38. No dilatation. This could represent any area of enteritis. There are a few small mesenteric nodes. Vascular/Lymphatic: Normal caliber aorta and IVC with mild vascular calcifications. No specific abnormal lymph node enlargement seen in the abdomen and pelvis. Reproductive: Uterus and bilateral adnexa are unremarkable. Other: No free air or free fluid. 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 Healed traumatic deformities along the left pubic bone. Moderate degenerative changes along the lumbar spine. Transitional lumbosacral segment. Trace listhesis of L3 on L4. IMPRESSION: Subtle area of fold thickening or wall thickening along a short segment loop of jejunum in the left midabdomen with some adjacent fat stranding. Please correlate for focal enteritis. If needed a follow up IV contrast CT may be useful to further delineate the walls of the bowel. Colonic diverticulosis.  No obstruction or free air. No obstructing renal stone. Previous cholecystectomy with a dilated biliary tree but this was seen on the study of 2015. Please correlate with clinical history Electronically Signed   By: Karen Kays M.D.   On:  01/28/2023 17:25    Scheduled Meds:  calcium-vitamin D  1 tablet Oral Q breakfast   And   magnesium oxide  400 mg Oral Q breakfast   enoxaparin (LOVENOX) injection  40 mg Subcutaneous Q24H   insulin aspart  0-15 Units Subcutaneous TID WC   [START ON 01/30/2023] insulin glargine-yfgn  10 Units Subcutaneous Daily   nystatin  1 Application Topical BID   triamcinolone cream  1 Application Topical TID   Continuous Infusions:  sodium chloride 125 mL/hr at 01/29/23 1205   PRN Meds: magnesium hydroxide, ondansetron **OR** ondansetron (ZOFRAN) IV, traMADol, traZODone  Time spent: 35 minutes  Author: Gillis Santa. MD [facility] 01/29/2023 4:08 PM  To reach On-call, see care teams to locate the attending and reach out to them via [email or web address]. If 7PM-7AM, please contact night-coverage If you still have difficulty reaching the attending provider, please page the [facility] (Director on Call) for [facility] on amion for assistance.

## 2023-01-29 NOTE — Progress Notes (Signed)
  Transition of Care [facility]) Screening Note   Patient Details  Name: Venessa C Gatlin Date of Birth: Sep 15, 1946   Transition of Care [facility]) CM/SW Contact:    Chapman Fitch, RN Phone Number: 01/29/2023, 12:03 PM    Transition of Care Department [facility]) has reviewed patient and no TOC needs have been identified at this time. We will continue to monitor patient advancement through interdisciplinary progression rounds. If new patient transition needs arise, please place a TOC consult.

## 2023-01-30 ENCOUNTER — Observation Stay: Payer: Medicare HMO

## 2023-01-30 DIAGNOSIS — B179 Acute viral hepatitis, unspecified: Secondary | ICD-10-CM | POA: Diagnosis not present

## 2023-01-30 LAB — BASIC METABOLIC PANEL
Anion gap: 7 (ref 5–15)
BUN: 21 mg/dL (ref 8–23)
CO2: 18 mmol/L — ABNORMAL LOW (ref 22–32)
Calcium: 8.3 mg/dL — ABNORMAL LOW (ref 8.9–10.3)
Chloride: 111 mmol/L (ref 98–111)
Creatinine, Ser: 1.16 mg/dL — ABNORMAL HIGH (ref 0.44–1.00)
GFR, Estimated: 49 mL/min — ABNORMAL LOW (ref >60)
Glucose, Bld: 138 mg/dL — ABNORMAL HIGH (ref 70–99)
Potassium: 3.9 mmol/L (ref 3.5–5.1)
Sodium: 136 mmol/L (ref 135–145)

## 2023-01-30 LAB — CBC
HCT: 34.3 % — ABNORMAL LOW (ref 36.0–46.0)
Hemoglobin: 10.9 g/dL — ABNORMAL LOW (ref 12.0–15.0)
MCH: 28.8 pg (ref 26.0–34.0)
MCHC: 31.8 g/dL (ref 30.0–36.0)
MCV: 90.5 fL (ref 80.0–100.0)
Platelets: 250 10*3/uL (ref 150–400)
RBC: 3.79 MIL/uL — ABNORMAL LOW (ref 3.87–5.11)
RDW: 14.2 % (ref 11.5–15.5)
WBC: 7.5 10*3/uL (ref 4.0–10.5)
nRBC: 0 % (ref 0.0–0.2)

## 2023-01-30 LAB — GLUCOSE, CAPILLARY
Glucose-Capillary: 129 mg/dL — ABNORMAL HIGH (ref 70–99)
Glucose-Capillary: 108 mg/dL — ABNORMAL HIGH (ref 70–99)
Glucose-Capillary: 148 mg/dL — ABNORMAL HIGH (ref 70–99)
Glucose-Capillary: 148 mg/dL — ABNORMAL HIGH (ref 70–99)

## 2023-01-30 LAB — PHOSPHORUS: Phosphorus: 2.2 mg/dL — ABNORMAL LOW (ref 2.5–4.6)

## 2023-01-30 LAB — HEPATIC FUNCTION PANEL
ALT: 311 U/L — ABNORMAL HIGH (ref 0–44)
AST: 247 U/L — ABNORMAL HIGH (ref 15–41)
Albumin: 3 g/dL — ABNORMAL LOW (ref 3.5–5.0)
Alkaline Phosphatase: 446 U/L — ABNORMAL HIGH (ref 38–126)
Bilirubin, Direct: 2.6 mg/dL — ABNORMAL HIGH (ref 0.0–0.2)
Indirect Bilirubin: 1.2 mg/dL — ABNORMAL HIGH (ref 0.3–0.9)
Total Bilirubin: 3.8 mg/dL — ABNORMAL HIGH (ref 0.3–1.2)
Total Protein: 7.4 g/dL (ref 6.5–8.1)

## 2023-01-30 LAB — MAGNESIUM: Magnesium: 2 mg/dL (ref 1.7–2.4)

## 2023-01-30 LAB — LIPASE, BLOOD: Lipase: 47 U/L (ref 11–51)

## 2023-01-30 MED ORDER — K PHOS MONO-SOD PHOS DI & MONO 155-852-130 MG PO TABS
500.0000 mg | ORAL_TABLET | Freq: Three times a day (TID) | ORAL | Status: AC
  Administered 2023-01-30: 500 mg via ORAL
  Filled 2023-01-30 (×2): qty 2

## 2023-01-30 MED ORDER — TECHNETIUM TC 99M MEBROFENIN IV KIT
7.5000 | PACK | Freq: Once | INTRAVENOUS | Status: AC | PRN
  Administered 2023-01-30: 7.56 via INTRAVENOUS

## 2023-01-30 MED ORDER — SODIUM BICARBONATE 650 MG PO TABS
650.0000 mg | ORAL_TABLET | Freq: Three times a day (TID) | ORAL | Status: AC
  Administered 2023-01-30 (×3): 650 mg via ORAL
  Filled 2023-01-30 (×3): qty 1

## 2023-01-30 NOTE — Progress Notes (Signed)
[facility] Progress Note  Patient: [name] [name]    ZOX:[id number]  DOA: 01/28/2023     Date of Service: the patient was seen and examined on 01/30/2023  Chief Complaint  Patient presents with   Abdominal Pain   Brief hospital course: [name] [name] is a 76 y.o. African-American female with medical history significant for type 2 diabetes mellitus, hypertension and CVA/TIA as well as osteoarthritis and stage IIIa chronic kidney disease, who presented to the ER with acute onset of epigastric abdominal pain with associated recurrent vomiting that started earlier on the day of admission.   ED w/up: Mild hyperglycemia, serum glucose 291, creatinine 1.59, elevated LFTs alk phos 499, AST 696, ALT 311, total bili 2.9, lipase 59 elevated.  Leukocytosis WBC 24.1 US Liver: Stable biliary dilatation postcholecystectomy. Unremarkable sonographic appearance of the liver. CT A/P: Subtle area of fold thickening or wall thickening along a short segment loop of jejunum in the left midabdomen with some adjacent fat stranding. Please correlate for focal enteritis. If needed a follow up IV contrast CT may be useful to further delineate the walls of the bowel. Colonic diverticulosis.  No obstruction or free air. No obstructing renal stone. Previous cholecystectomy with a dilated biliary tree but this was seen on the study of 2015. Please correlate with clinical history MRCP: Status post cholecystectomy. Severe, predominantly extrahepatic biliary ductal dilatation, the common bile duct measuring up to 2.2 cm in caliber although tapering smoothly to the ampulla. No calculus or other obstructing etiology identified. This is generally greater than expected degree of postoperative biliary ductal dilatation, although this may nonetheless reflect benign postoperative biliary ductal dilatation. Consider nuclear scintigraphic HIDA or ERCP to further assess for patency of the common bile duct if there is clinical  concern for obstruction. Descending and sigmoid diverticulosis.  Assessment and Plan:  # Acute hepatitis Elevated LFTs, continue to monitor Follow acute viral hepatitis panel Ultrasound, CT and MRCP reviewed as above Continue symptomatic treatment GI consulted, recommended HIDA scan, Patent common bile duct. No findings for obstruction.  Continue low-fat diet Continue gentle hydration NS 75 mill per hour  # AKI, on CKDstage IIIa Baseline creatinine 1.1-1.3 Creatinine 1.59 on admission Creatinine 1.16, gradually improving Continue IV fluid for hydration, monitor renal functions and urine output Avoid nephrotoxic medications, use renally dose medications  # Hypertension and hyperlipidemia Blood pressure soft Held Lipitor due to elevated LFTs Held lisinopril-hydrochlorothiazide due to AKI Monitor BP and titrate medications accordingly   # IDDM T2, hemoglobin A1c 9.3, uncontrolled diabetes Held home medications for now Continue NovoLog sliding scale, monitor CBG Continue diabetic diet  Body mass index is 33.15 kg/m.  Interventions:       Diet: Low-fat diet DVT Prophylaxis: Subcutaneous Lovenox   Advance goals of care discussion: Full code  Family Communication: family was present at bedside, at the time of interview.  The pt provided permission to discuss medical plan with the family. Opportunity was given to ask question and all questions were answered satisfactorily.   Disposition:  Pt is from Home, admitted with acute hepatitis, still has elevated LFTs, which precludes a safe discharge. Discharge to home, when cleared by GI, may need few days to stay in the hospital.  Subjective: No significant events overnight, patient was sitting audibly in the recliner, denied any nausea vomiting, no abdominal pain.  Patient did move bowels today regular BM, no diarrhea.  Denies any chest pain or palpitation, no shortness of breath.  No any other complaints.  Physical  Exam: General: NAD, lying comfortably Appear in no distress, affect appropriate Eyes: PERRLA ENT: Oral Mucosa Clear, moist  Neck: no JVD,  Cardiovascular: S1 and S2 Present, no Murmur,  Respiratory: good respiratory effort, Bilateral Air entry equal and Decreased, no Crackles, no wheezes Abdomen: Bowel Sound present, Soft and no tenderness,  Skin: no rashes Extremities: no Pedal edema, no calf tenderness Neurologic: without any new focal findings Gait not checked due to patient safety concerns  Vitals:   01/29/23 1644 01/29/23 2114 01/30/23 0419 01/30/23 0801  BP: (!) 96/58 118/69 107/66 105/61  Pulse: 81 69 78 74  Resp: 18 18 18 18   Temp: 98.3 F (36.8 C) 98.6 F (37 C) 98.1 F (36.7 C) 98.2 F (36.8 C)  TempSrc:  Oral    SpO2: 96% 97% 95% 97%  Weight:      Height:        Intake/Output Summary (Last 24 hours) at 01/30/2023 1503 Last data filed at 01/30/2023 0537 Gross per 24 hour  Intake 1452.36 ml  Output --  Net 1452.36 ml   Filed Weights   01/29/23 0207 01/29/23 0627  Weight: 87.1 kg 87.6 kg    Data Reviewed: I have personally reviewed and interpreted daily labs, tele strips, imagings as discussed above. I reviewed all nursing notes, pharmacy notes, vitals, pertinent old records I have discussed plan of care as described above with RN and patient/family.  CBC: Recent Labs  Lab 01/28/23 1458 01/29/23 0533 01/30/23 0755  WBC 24.1* 16.3* 7.5  HGB 13.1 11.0* 10.9*  HCT 41.2 34.5* 34.3*  MCV 90.4 90.6 90.5  PLT 324 266 250   Basic Metabolic Panel: Recent Labs  Lab 01/28/23 1458 01/29/23 0533 01/30/23 0755  NA 138 140 136  K 4.5 3.7 3.9  CL 103 112* 111  CO2 22 24 18*  GLUCOSE 291* 135* 138*  BUN 46* 40* 21  CREATININE 1.59* 1.53* 1.16*  CALCIUM 9.5 8.3* 8.3*  MG  --  2.2 2.0  PHOS  --  3.5 2.2*    Studies: NM Hepatobiliary Liver Func  Result Date: 01/30/2023 CLINICAL DATA:  Biliary dilatation on recent MRI. Remote history of cholecystectomy.  EXAM: NUCLEAR MEDICINE HEPATOBILIARY IMAGING TECHNIQUE: Sequential images of the abdomen were obtained out to 60 minutes following intravenous administration of radiopharmaceutical. RADIOPHARMACEUTICALS:  7.55 mCi Tc-7m  Choletec IV COMPARISON:  MR abdomen 01/28/2023 FINDINGS: Symmetric uptake in the liver and slow excretion into the biliary tree. The common bile duct is visualized at 36 minutes. Activity is seen in the small bowel at 51 minutes. IMPRESSION: Patent common bile duct.  No findings for obstruction. Electronically Signed   By: Rudie Meyer M.D.   On: 01/30/2023 14:21    Scheduled Meds:  calcium-vitamin D  1 tablet Oral Q breakfast   And   magnesium oxide  400 mg Oral Q breakfast   enoxaparin (LOVENOX) injection  40 mg Subcutaneous Q24H   insulin aspart  0-15 Units Subcutaneous TID WC   insulin glargine-yfgn  10 Units Subcutaneous Daily   nystatin  1 Application Topical BID   phosphorus  500 mg Oral TID   sodium bicarbonate  650 mg Oral TID   triamcinolone cream  1 Application Topical TID   Continuous Infusions:  sodium chloride 125 mL/hr at 01/30/23 1426   PRN Meds: magnesium hydroxide, ondansetron **OR** ondansetron (ZOFRAN) IV, traMADol, traZODone  Time spent: 35 minutes  Author: Gillis Santa. MD [facility] 01/30/2023 3:03 PM  To reach On-call,  see care teams to locate the attending and reach out to them via [email or web address]. If 7PM-7AM, please contact night-coverage If you still have difficulty reaching the attending provider, please page the [facility] (Director on Call) for [facility] on amion for assistance.

## 2023-01-30 NOTE — Progress Notes (Signed)
[facility] Gastroenterology Inpatient Progress Note    Subjective: Patient seen for f/u abdominal pain. Patient denies further abdominal pain, just c/o some skin breakdown and discomfort on skin of right lower quadrant.  Objective: Vital signs in last 24 hours: Temp:  [98.1 F (36.7 C)-98.6 F (37 C)] 98.2 F (36.8 C) (05/09 0801) Pulse Rate:  [69-81] 74 (05/09 0801) Resp:  [18] 18 (05/09 0801) BP: (96-118)/(58-69) 105/61 (05/09 0801) SpO2:  [95 %-97 %] 97 % (05/09 0801) Blood pressure 105/61, pulse 74, temperature 98.2 F (36.8 C), resp. rate 18, height 5\' 4"  (1.626 m), weight 87.6 kg, SpO2 97 %.    Intake/Output from previous day: 05/08 0701 - 05/09 0700 In: 1892.4 [P.O.:440; I.V.:1452.4] Out: -   Intake/Output this shift: No intake/output data recorded.   Gen: NAD. Appears comfortable.  HEENT: [place]/AT. PERRLA. Normal external ear exam.  Chest: CTA, no wheezes.  CV: RR nl S1, S2. No gallops.  Abd: soft, nt, nd. BS+  Ext: no edema. Pulses 2+  Neuro: Alert and oriented. Judgement appears normal. Nonfocal.   Lab Results: Results for orders placed or performed during the hospital encounter of 01/28/23 (from the past 24 hour(s))  Glucose, capillary     Status: None   Collection Time: 01/29/23  9:42 AM  Result Value Ref Range   Glucose-Capillary 92 70 - 99 mg/dL  Glucose, capillary     Status: Abnormal   Collection Time: 01/29/23 11:50 AM  Result Value Ref Range   Glucose-Capillary 131 (H) 70 - 99 mg/dL   Comment 1 Notify RN    Comment 2 Document in Chart   Glucose, capillary     Status: Abnormal   Collection Time: 01/29/23  4:45 PM  Result Value Ref Range   Glucose-Capillary 65 (L) 70 - 99 mg/dL   Comment 1 Notify RN    Comment 2 Document in Chart   Glucose, capillary     Status: None   Collection Time: 01/29/23  7:44 PM  Result Value Ref Range   Glucose-Capillary 80 70 - 99 mg/dL  Glucose, capillary     Status: Abnormal   Collection Time: 01/29/23   9:17 PM  Result Value Ref Range   Glucose-Capillary 121 (H) 70 - 99 mg/dL  Basic metabolic panel     Status: Abnormal   Collection Time: 01/30/23  7:55 AM  Result Value Ref Range   Sodium 136 135 - 145 mmol/L   Potassium 3.9 3.5 - 5.1 mmol/L   Chloride 111 98 - 111 mmol/L   CO2 18 (L) 22 - 32 mmol/L   Glucose, Bld 138 (H) 70 - 99 mg/dL   BUN 21 8 - 23 mg/dL   Creatinine, Ser 1.61 (H) 0.44 - 1.00 mg/dL   Calcium 8.3 (L) 8.9 - 10.3 mg/dL   GFR, Estimated 49 (L) >60 mL/min   Anion gap 7 5 - 15  CBC     Status: Abnormal   Collection Time: 01/30/23  7:55 AM  Result Value Ref Range   WBC 7.5 4.0 - 10.5 K/uL   RBC 3.79 (L) 3.87 - 5.11 MIL/uL   Hemoglobin 10.9 (L) 12.0 - 15.0 g/dL   HCT 09.6 (L) 04.5 - 40.9 %   MCV 90.5 80.0 - 100.0 fL   MCH 28.8 26.0 - 34.0 pg   MCHC 31.8 30.0 - 36.0 g/dL   RDW 81.1 91.4 - 78.2 %   Platelets 250 150 - 400 K/uL   nRBC 0.0 0.0 - 0.2 %  Hepatic function panel     Status: Abnormal   Collection Time: 01/30/23  7:55 AM  Result Value Ref Range   Total Protein 7.4 6.5 - 8.1 g/dL   Albumin 3.0 (L) 3.5 - 5.0 g/dL   AST 161 (H) 15 - 41 U/L   ALT 311 (H) 0 - 44 U/L   Alkaline Phosphatase 446 (H) 38 - 126 U/L   Total Bilirubin 3.8 (H) 0.3 - 1.2 mg/dL   Bilirubin, Direct 2.6 (H) 0.0 - 0.2 mg/dL   Indirect Bilirubin 1.2 (H) 0.3 - 0.9 mg/dL  Magnesium     Status: None   Collection Time: 01/30/23  7:55 AM  Result Value Ref Range   Magnesium 2.0 1.7 - 2.4 mg/dL  Phosphorus     Status: Abnormal   Collection Time: 01/30/23  7:55 AM  Result Value Ref Range   Phosphorus 2.2 (L) 2.5 - 4.6 mg/dL  Lipase, blood     Status: None   Collection Time: 01/30/23  7:55 AM  Result Value Ref Range   Lipase 47 11 - 51 U/L     Recent Labs    01/28/23 1458 01/29/23 0533 01/30/23 0755  WBC 24.1* 16.3* 7.5  HGB 13.1 11.0* 10.9*  HCT 41.2 34.5* 34.3*  PLT 324 266 250   BMET Recent Labs    01/28/23 1458 01/29/23 0533 01/30/23 0755  NA 138 140 136  K 4.5 3.7 3.9   CL 103 112* 111  CO2 22 24 18*  GLUCOSE 291* 135* 138*  BUN 46* 40* 21  CREATININE 1.59* 1.53* 1.16*  CALCIUM 9.5 8.3* 8.3*   LFT Recent Labs    01/30/23 0755  PROT 7.4  ALBUMIN 3.0*  AST 247*  ALT 311*  ALKPHOS 446*  BILITOT 3.8*  BILIDIR 2.6*  IBILI 1.2*   PT/INR No results for input(s): "LABPROT", "INR" in the last 72 hours. Hepatitis Panel Recent Labs    01/29/23 0533  HEPBSAG NON REACTIVE  HCVAB NON REACTIVE  HEPAIGM NON REACTIVE  HEPBIGM NON REACTIVE   C-Diff No results for input(s): "CDIFFTOX" in the last 72 hours. No results for input(s): "CDIFFPCR" in the last 72 hours.   Studies/Results: MR ABDOMEN MRCP WO CONTRAST  Result Date: 01/28/2023 CLINICAL DATA:  Right upper quadrant abdominal pain, status post cholecystectomy EXAM: MRI ABDOMEN WITHOUT CONTRAST  (INCLUDING MRCP) TECHNIQUE: Multiplanar multisequence MR imaging of the abdomen was performed. Heavily T2-weighted images of the biliary and pancreatic ducts were obtained, and three-dimensional MRCP images were rendered by post processing. COMPARISON:  CT abdomen pelvis, 01/28/2023 MR abdomen, 12/24/2013 FINDINGS: Lower chest: No acute abnormality. Hepatobiliary: No focal liver abnormality is seen. Status post cholecystectomy. Severe, predominantly extrahepatic biliary ductal dilatation, the common bile duct measuring up to 2.2 cm in caliber although tapering smoothly to the ampulla (series 3, image 22). No calculus or other obstructing etiology identified. Pancreas: Unremarkable. No pancreatic ductal dilatation or surrounding inflammatory changes. Spleen: Normal in size without significant abnormality. Adrenals/Urinary Tract: Adrenal glands are unremarkable. Kidneys are normal, without obvious renal calculi, solid lesion, or hydronephrosis. Stomach/Bowel: Stomach is within normal limits. No evidence of bowel wall thickening, distention, or inflammatory changes. Descending and sigmoid diverticulosis.  Vascular/Lymphatic: No significant vascular findings are present. No enlarged abdominal lymph nodes. Other: No abdominal wall hernia or abnormality. No ascites. Musculoskeletal: No acute or significant osseous findings. IMPRESSION: 1. Status post cholecystectomy. Severe, predominantly extrahepatic biliary ductal dilatation, the common bile duct measuring up to 2.2 cm in caliber although tapering  smoothly to the ampulla. No calculus or other obstructing etiology identified. This is generally greater than expected degree of postoperative biliary ductal dilatation, although this may nonetheless reflect benign postoperative biliary ductal dilatation. Consider nuclear scintigraphic HIDA or ERCP to further assess for patency of the common bile duct if there is clinical concern for obstruction. 2. Descending and sigmoid diverticulosis. Electronically Signed   By: Jearld Lesch M.D.   On: 01/28/2023 21:44   MR 3D Recon At Scanner  Result Date: 01/28/2023 CLINICAL DATA:  Right upper quadrant abdominal pain, status post cholecystectomy EXAM: MRI ABDOMEN WITHOUT CONTRAST  (INCLUDING MRCP) TECHNIQUE: Multiplanar multisequence MR imaging of the abdomen was performed. Heavily T2-weighted images of the biliary and pancreatic ducts were obtained, and three-dimensional MRCP images were rendered by post processing. COMPARISON:  CT abdomen pelvis, 01/28/2023 MR abdomen, 12/24/2013 FINDINGS: Lower chest: No acute abnormality. Hepatobiliary: No focal liver abnormality is seen. Status post cholecystectomy. Severe, predominantly extrahepatic biliary ductal dilatation, the common bile duct measuring up to 2.2 cm in caliber although tapering smoothly to the ampulla (series 3, image 22). No calculus or other obstructing etiology identified. Pancreas: Unremarkable. No pancreatic ductal dilatation or surrounding inflammatory changes. Spleen: Normal in size without significant abnormality. Adrenals/Urinary Tract: Adrenal glands are  unremarkable. Kidneys are normal, without obvious renal calculi, solid lesion, or hydronephrosis. Stomach/Bowel: Stomach is within normal limits. No evidence of bowel wall thickening, distention, or inflammatory changes. Descending and sigmoid diverticulosis. Vascular/Lymphatic: No significant vascular findings are present. No enlarged abdominal lymph nodes. Other: No abdominal wall hernia or abnormality. No ascites. Musculoskeletal: No acute or significant osseous findings. IMPRESSION: 1. Status post cholecystectomy. Severe, predominantly extrahepatic biliary ductal dilatation, the common bile duct measuring up to 2.2 cm in caliber although tapering smoothly to the ampulla. No calculus or other obstructing etiology identified. This is generally greater than expected degree of postoperative biliary ductal dilatation, although this may nonetheless reflect benign postoperative biliary ductal dilatation. Consider nuclear scintigraphic HIDA or ERCP to further assess for patency of the common bile duct if there is clinical concern for obstruction. 2. Descending and sigmoid diverticulosis. Electronically Signed   By: Jearld Lesch M.D.   On: 01/28/2023 21:44   US Abdomen Limited RUQ (LIVER/GB)  Result Date: 01/28/2023 CLINICAL DATA:  Abdominal pain. EXAM: ULTRASOUND ABDOMEN LIMITED RIGHT UPPER QUADRANT COMPARISON:  CT earlier today, concurrent MRCP FINDINGS: Gallbladder: Surgically absent. Common bile duct: Diameter: Dilated at 18 mm, this is unchanged from the earlier CT. Liver: No focal lesion identified. Within normal limits in parenchymal echogenicity. Portal vein is patent on color Doppler imaging with normal direction of blood flow towards the liver. Other: No right upper quadrant ascites. IMPRESSION: 1. Stable biliary dilatation postcholecystectomy. 2. Unremarkable sonographic appearance of the liver. Electronically Signed   By: Narda Rutherford M.D.   On: 01/28/2023 21:41   CT ABDOMEN PELVIS WO  CONTRAST  Result Date: 01/28/2023 CLINICAL DATA:  Abdominal pain and vomiting. EXAM: CT ABDOMEN AND PELVIS WITHOUT CONTRAST TECHNIQUE: Multidetector CT imaging of the abdomen and pelvis was performed following the standard protocol without IV contrast. RADIATION DOSE REDUCTION: This exam was performed according to the departmental dose-optimization program which includes automated exposure control, adjustment of the mA and/or kV according to patient size and/or use of iterative reconstruction technique. COMPARISON:  Prior CT scan 12/23/2013 of the abdomen and pelvis. FINDINGS: Lower chest: There is some linear opacity seen along bases likely scar or atelectasis. No pleural effusion. Scattered bronchiectasis. Coronary artery calcifications are seen.  Hepatobiliary: Previous cholecystectomy. Stable dilated common duct measuring up to 20 mm. Previously 18 mm. Normal tapering towards the pancreatic head. Preserved hepatic parenchyma on this noncontrast exam. Pancreas: Unremarkable. No pancreatic ductal dilatation or surrounding inflammatory changes. Spleen: Normal in size without focal abnormality. Adrenals/Urinary Tract: The adrenal glands are preserved. No abnormal calcifications are seen within either kidney nor along the course of either ureter. 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 Stomach/Bowel: Stomach is mildly distended with fluid. The large bowel is nondilated. Left-sided colonic diverticulosis greater than right. The appendix is not clearly seen in the right lower quadrant. No pericecal stranding or fluid. There is a loop of small bowel in the left midabdomen which has some mild wall thickening and there is some adjacent mesenteric stranding as seen on axial series 2, image 48 and coronal series 5, image 38. No dilatation. This could represent any area of enteritis. There are a few small mesenteric  nodes. Vascular/Lymphatic: Normal caliber aorta and IVC with mild vascular calcifications. No specific abnormal lymph node enlargement seen in the abdomen and pelvis. Reproductive: Uterus and bilateral adnexa are unremarkable. Other: No free air or free fluid. 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 Healed traumatic deformities along the left pubic bone. Moderate degenerative changes along the lumbar spine. Transitional lumbosacral segment. Trace listhesis of L3 on L4. IMPRESSION: Subtle area of fold thickening or wall thickening along a short segment loop of jejunum in the left midabdomen with some adjacent fat stranding. Please correlate for focal enteritis. If needed a follow up IV contrast CT may be useful to further delineate the walls of the bowel. Colonic diverticulosis.  No obstruction or free air. No obstructing renal stone. Previous cholecystectomy with a dilated biliary tree but this was seen on the study of 2015. Please correlate with clinical history Electronically Signed   By: Karen Kays M.D.   On: 01/28/2023 17:25    Scheduled Inpatient Medications:    calcium-vitamin D  1 tablet Oral Q breakfast   And   magnesium oxide  400 mg Oral Q breakfast   enoxaparin (LOVENOX) injection  40 mg Subcutaneous Q24H   insulin aspart  0-15 Units Subcutaneous TID WC   insulin glargine-yfgn  10 Units Subcutaneous Daily   nystatin  1 Application Topical BID   triamcinolone cream  1 Application Topical TID    Continuous Inpatient Infusions:    sodium chloride 125 mL/hr at 01/30/23 0456    PRN Inpatient Medications:  magnesium hydroxide, ondansetron **OR** ondansetron (ZOFRAN) IV, traMADol, traZODone  Miscellaneous:   Assessment:  77 y/o AA female with a PMH of HTN, OA, CKD Stage III, hx of TIA, hx of CVA, T2DM, and obesity (BMI 33) presented to the [facility] ED yesterday afternoon for chief complaint of 1-day history of epigastric abdominal pain with  associated nausea and vomiting.    Elevated LFTs - mixed pattern with evidence of cholestasis with total bilirubin 3.7, alk phos 463 and transaminitis with AST 584, ALT 452   CBD dilatation (2.2 cm) - MRCP commented on extrahepatic biliary dilatation with CBD 2.2 cm tapering smoothly to ampulla. Planning HIDA scan today to check duct patency.   Epigastric abdominal pain/nausea/vomiting - potentially biliary obstruction, acute gastroenteritis, etc Resolved for now.   AKI on CKD Stage III   Uncontrolled T2DM   Obesity     Plan:  Await HIDA scan. Continue supportive  Tx. Following.  Naydeen Speirs K. Norma Fredrickson, M.D. 01/30/2023, 8:49 AM

## 2023-01-31 DIAGNOSIS — B179 Acute viral hepatitis, unspecified: Secondary | ICD-10-CM | POA: Diagnosis not present

## 2023-01-31 LAB — BASIC METABOLIC PANEL
Anion gap: 4 — ABNORMAL LOW (ref 5–15)
BUN: 15 mg/dL (ref 8–23)
CO2: 20 mmol/L — ABNORMAL LOW (ref 22–32)
Calcium: 8 mg/dL — ABNORMAL LOW (ref 8.9–10.3)
Chloride: 114 mmol/L — ABNORMAL HIGH (ref 98–111)
Creatinine, Ser: 1.14 mg/dL — ABNORMAL HIGH (ref 0.44–1.00)
GFR, Estimated: 50 mL/min — ABNORMAL LOW (ref >60)
Glucose, Bld: 171 mg/dL — ABNORMAL HIGH (ref 70–99)
Potassium: 3.9 mmol/L (ref 3.5–5.1)
Sodium: 138 mmol/L (ref 135–145)

## 2023-01-31 LAB — HEPATIC FUNCTION PANEL
ALT: 201 U/L — ABNORMAL HIGH (ref 0–44)
AST: 127 U/L — ABNORMAL HIGH (ref 15–41)
Albumin: 2.6 g/dL — ABNORMAL LOW (ref 3.5–5.0)
Alkaline Phosphatase: 395 U/L — ABNORMAL HIGH (ref 38–126)
Bilirubin, Direct: 1.1 mg/dL — ABNORMAL HIGH (ref 0.0–0.2)
Indirect Bilirubin: 0.9 mg/dL (ref 0.3–0.9)
Total Bilirubin: 2 mg/dL — ABNORMAL HIGH (ref 0.3–1.2)
Total Protein: 6.7 g/dL (ref 6.5–8.1)

## 2023-01-31 LAB — CBC
HCT: 30.7 % — ABNORMAL LOW (ref 36.0–46.0)
Hemoglobin: 9.8 g/dL — ABNORMAL LOW (ref 12.0–15.0)
MCH: 28.7 pg (ref 26.0–34.0)
MCHC: 31.9 g/dL (ref 30.0–36.0)
MCV: 90 fL (ref 80.0–100.0)
Platelets: 225 10*3/uL (ref 150–400)
RBC: 3.41 MIL/uL — ABNORMAL LOW (ref 3.87–5.11)
RDW: 14.1 % (ref 11.5–15.5)
WBC: 5.1 10*3/uL (ref 4.0–10.5)
nRBC: 0 % (ref 0.0–0.2)

## 2023-01-31 LAB — PHOSPHORUS: Phosphorus: 2.7 mg/dL (ref 2.5–4.6)

## 2023-01-31 LAB — GLUCOSE, CAPILLARY
Glucose-Capillary: 173 mg/dL — ABNORMAL HIGH (ref 70–99)
Glucose-Capillary: 161 mg/dL — ABNORMAL HIGH (ref 70–99)

## 2023-01-31 LAB — MAGNESIUM: Magnesium: 1.8 mg/dL (ref 1.7–2.4)

## 2023-01-31 MED ORDER — LISINOPRIL 20 MG PO TABS: 20.0000 mg | ORAL_TABLET | Freq: Every day | ORAL | 11 refills | Status: DC

## 2023-01-31 NOTE — Progress Notes (Signed)
GI Inpatient Follow-up Note  Subjective:  Patient seen in follow-up for epigastric abdominal pain, nausea, vomiting, and dilated CBD. No acute events overnight. HIDA scan performed yesterday showed patent CBD with no findings for obstruction. She reports she is feeling well this morning. No complaints of abdominal pain, nausea, or vomiting. She is tolerating solid food today without difficulties. She is wanting to go home. Labs today showed downtrending LFTs.   Scheduled Inpatient Medications:   calcium-vitamin D  1 tablet Oral Q breakfast   And   magnesium oxide  400 mg Oral Q breakfast   enoxaparin (LOVENOX) injection  40 mg Subcutaneous Q24H   insulin aspart  0-15 Units Subcutaneous TID WC   insulin glargine-yfgn  10 Units Subcutaneous Daily   nystatin  1 Application Topical BID   triamcinolone cream  1 Application Topical TID    Continuous Inpatient Infusions:    sodium chloride Stopped (01/31/23 1116)    PRN Inpatient Medications:  magnesium hydroxide, ondansetron **OR** ondansetron (ZOFRAN) IV, traMADol, traZODone  Review of Systems: Constitutional: Weight is stable.  Eyes: No changes in vision. ENT: No oral lesions, sore throat.  GI: see HPI.  Heme/Lymph: No easy bruising.  CV: No chest pain.  GU: No hematuria.  Integumentary: No rashes.  Neuro: No headaches.  Psych: No depression/anxiety.  Endocrine: No heat/cold intolerance.  Allergic/Immunologic: No urticaria.  Resp: No cough, SOB.  Musculoskeletal: No joint swelling.    Physical Examination: BP 137/66 (BP Location: Right Arm)   Pulse 70   Temp 98.5 F (36.9 C) (Oral)   Resp 20   Ht 5\' 4"  (1.626 m)   Wt 87.6 kg   SpO2 97%   BMI 33.15 kg/m  Gen: NAD, alert and oriented x 4 HEENT: PEERLA, EOMI, Neck: supple, no JVD or thyromegaly Chest: CTA bilaterally, no wheezes, crackles, or other adventitious sounds CV: RRR, no m/g/c/r Abd: soft, NT, ND, +BS in all four quadrants; no HSM, guarding, ridigity, or  rebound tenderness Ext: no edema, well perfused with 2+ pulses, Skin: no rash or lesions noted Lymph: no LAD  Data: Lab Results  Component Value Date   WBC 5.1 01/31/2023   HGB 9.8 (L) 01/31/2023   HCT 30.7 (L) 01/31/2023   MCV 90.0 01/31/2023   PLT 225 01/31/2023   Recent Labs  Lab 01/29/23 0533 01/30/23 0755 01/31/23 0455  HGB 11.0* 10.9* 9.8*   Lab Results  Component Value Date   NA 138 01/31/2023   K 3.9 01/31/2023   CL 114 (H) 01/31/2023   CO2 20 (L) 01/31/2023   BUN 15 01/31/2023   CREATININE 1.14 (H) 01/31/2023   Lab Results  Component Value Date   ALT 201 (H) 01/31/2023   AST 127 (H) 01/31/2023   ALKPHOS 395 (H) 01/31/2023   BILITOT 2.0 (H) 01/31/2023   No results for input(s): "APTT", "INR", "PTT" in the last 168 hours.  HIDA 01/30/2023: FINDINGS: Symmetric uptake in the liver and slow excretion into the biliary tree. The common bile duct is visualized at 36 minutes. Activity is seen in the small bowel at 51 minutes.   IMPRESSION: Patent common bile duct.  No findings for obstruction.  MRI/MRCP 01/28/2023: IMPRESSION: 1. Status post cholecystectomy. Severe, predominantly extrahepatic biliary ductal dilatation, the common bile duct measuring up to 2.2 cm in caliber although tapering smoothly to the ampulla. No calculus or other obstructing etiology identified. This is generally greater than expected degree of postoperative biliary ductal dilatation, although this may nonetheless reflect benign  postoperative biliary ductal dilatation. Consider nuclear scintigraphic HIDA or ERCP to further assess for patency of the common bile duct if there is clinical concern for obstruction. 2. Descending and sigmoid diverticulosis.   CT abd/pelvis with contrast 01/28/2023: IMPRESSION: Subtle area of fold thickening or wall thickening along a short segment loop of jejunum in the left midabdomen with some adjacent fat stranding. Please correlate for focal enteritis.  If needed a follow up IV contrast CT may be useful to further delineate the walls of the bowel.   Colonic diverticulosis.  No obstruction or free air.   No obstructing renal stone.   Previous cholecystectomy with a dilated biliary tree but this was seen on the study of 2015. Please correlate with clinical history   RUQ [place] 01/28/2023: FINDINGS: Gallbladder:   Surgically absent.   Common bile duct:   Diameter: Dilated at 18 mm, this is unchanged from the earlier CT.   Liver:   No focal lesion identified. Within normal limits in parenchymal echogenicity. Portal vein is patent on color Doppler imaging with normal direction of blood flow towards the liver.   Other: No right upper quadrant ascites.   IMPRESSION: 1. Stable biliary dilatation postcholecystectomy. 2. Unremarkable sonographic appearance of the liver.  Assessment/Plan:  77 y/o AA female with a PMH of HTN, OA, CKD Stage III, hx of TIA, hx of CVA, T2DM, and obesity (BMI 33) presented to the [facility] ED yesterday afternoon for chief complaint of 1-day history of epigastric abdominal pain with associated nausea and vomiting.    Acute idiopathic hepatitis/ Elevated LFTs - mixed pattern, LFTs downtrending   CBD dilatation (2.2 cm) - MRCP commented on extrahepatic biliary dilatation with CBD 2.2 cm tapering smoothly to ampulla. HIDA yesterday negative with patent CBD with no signs of obstruction.    Epigastric abdominal pain/nausea/vomiting - most likely 2/2 acute gastroenteritis, resolving    AKI on CKD Stage III   Uncontrolled T2DM   Obesity   Recommendations:  - LFTs are downtrending and look much better - Clinically, patient doing very well. Suspect self-limited gastroenteritis as cause of her symptoms. Epigastric abd pain, nausea, and vomiting have resolved.  - OK for d/c from GI standpoint - Continue supportive care with good fluid hydration  - She should follow-up with PCP in 1-2 weeks for re-check on labs -  Follow-up with GI as outpatient in 2-4 weeks. I will help arrange appointment with my office.  - GI will sign off at this time   Please call with questions or concerns.    Jacob Moores, PA-C [facility] [phone]

## 2023-01-31 NOTE — Care Management Obs Status (Signed)
MEDICARE OBSERVATION STATUS NOTIFICATION   Patient Details  Name: [name] [name] MRN: [id number] Date of Birth: 1945/12/14   Medicare Observation Status Notification Given:  Yes    Margarito Liner, LCSW 01/31/2023, 12:50 PM

## 2023-01-31 NOTE — Discharge Summary (Signed)
[facility] Discharge Summary   Patient: [name] [name] ZOX:[id number]  PCP: Sherron Monday, MD  Date of admission: 01/28/2023   Date of discharge:  01/31/2023     Discharge Diagnoses:  Principal Problem:   Acute hepatitis Active Problems:   Acute kidney injury superimposed on chronic kidney disease (HCC)   Uncontrolled type 2 diabetes mellitus with hyperglycemia, with long-term current use of insulin (HCC)   Essential hypertension   Dyslipidemia   Admitted From: Home Disposition:  Home   Recommendations for Outpatient Follow-up:  Follow-up with PCP in 1 week, continue to monitor BP and titrate medications accordingly.  Discontinued hydrochlorothiazide due to dehydration and AKI.  Continue lisinopril.  Repeat LFTs after 1 week Follow-up with GI as an outpatient of recurrent vomiting and abdominal pain Follow up LABS/TEST:  LFTs   Diet recommendation: Cardiac and Carb modified diet  Activity: The patient is advised to gradually reintroduce usual activities, as tolerated  Discharge Condition: stable  Code Status: Full code   History of present illness: As per the H and P dictated on admission Hospital Course:  [name] [name] is a 77 y.o. African-American female with medical history significant for type 2 diabetes mellitus, hypertension and CVA/TIA as well as osteoarthritis and stage IIIa chronic kidney disease, who presented to the ER with acute onset of epigastric abdominal pain with associated recurrent vomiting that started earlier on the day of admission.   ED w/up: Mild hyperglycemia, serum glucose 291, creatinine 1.59, elevated LFTs alk phos 499, AST 696, ALT 311, total bili 2.9, lipase 59 elevated.  Leukocytosis WBC 24.1 US Liver: Stable biliary dilatation postcholecystectomy. Unremarkable sonographic appearance of the liver. CT A/P: Subtle area of fold thickening or wall thickening along a short segment loop of jejunum in the left midabdomen with some adjacent  fat stranding. Please correlate for focal enteritis. If needed a follow up IV contrast CT may be useful to further delineate the walls of the bowel. Colonic diverticulosis.  No obstruction or free air. No obstructing renal stone. Previous cholecystectomy with a dilated biliary tree but this was seen on the study of 2015. Please correlate with clinical history MRCP: Status post cholecystectomy. Severe, predominantly extrahepatic biliary ductal dilatation, the common bile duct measuring up to 2.2 cm in caliber although tapering smoothly to the ampulla. No calculus or other obstructing etiology identified. This is generally greater than expected degree of postoperative biliary ductal dilatation, although this may nonetheless reflect benign postoperative biliary ductal dilatation. Consider nuclear scintigraphic HIDA or ERCP to further assess for patency of the common bile duct if there is clinical concern for obstruction. Descending and sigmoid diverticulosis.   Assessment and Plan: # Acute hepatitis, Elevated LFTs, gradually trending down, repeat LFTs after 1 week.  Follow with PCP. acute viral hepatitis panel negative. Ultrasound, CT and MRCP reviewed as above.  Patient remained asymptomatic. GI consulted, recommended HIDA scan, Patent common bile duct. No findings for obstruction.  Diet was advanced.  Patient was given IV fluid for hydration.  GI recommended to discharge and follow-up as an outpatient.  She may have passed CBD stone. # AKI, on CKDstage IIIa, Baseline creatinine 1.1-1.3. Creatinine was 1.59 on admission. Creatinine 1.14, gradually improving. S/p IV fluid for hydration.  Discontinued hydrochlorothiazide on discharge.  Patient was advised to continue adequate hydration and follow with PCP for further management. # Hypertension and hyperlipidemia Blood pressure soft, Held Lipitor during hospital stay due to elevated LFTs. Held lisinopril-hydrochlorothiazide due to AKI.  BP improved  on  discharge, resumed lisinopril, discontinued hydrochlorothiazide on discharge.  Patient was advised to monitor BP at home and follow with PCP to titrate medications accordingly. # IDDM T2, hemoglobin A1c 9.3, uncontrolled diabetes.  S/p NovoLog sliding scale.  Resumed home medications on discharge.  Monitor CBG at home and continue diabetic diet.  Follow with PCP.    Body mass index is 33.15 kg/m.  Nutrition Interventions:  Patient was ambulatory without any assistance. On the day of the discharge the patient's vitals were stable, and no other acute medical condition were reported by patient. the patient was felt safe to be discharge at Home   Consultants: GI Procedures: None  Discharge Exam: General: Appear in no distress, no Rash; Oral Mucosa Clear, moist. Cardiovascular: S1 and S2 Present, no Murmur, Respiratory: normal respiratory effort, Bilateral Air entry present and no Crackles, no wheezes Abdomen: Bowel Sound present, Soft and no tenderness, no hernia Extremities: no Pedal edema, no calf tenderness Neurology: alert and oriented to time, place, and person affect appropriate.  Filed Weights   01/29/23 0207 01/29/23 0981  Weight: 87.1 kg 87.6 kg   Vitals:   01/31/23 0406 01/31/23 0838  BP: 118/68 137/66  Pulse: (!) 57 70  Resp: 18 20  Temp: 98.3 F (36.8 C) 98.5 F (36.9 C)  SpO2: 97% 97%    DISCHARGE MEDICATION: Allergies as of 01/31/2023       Reactions   Cortisone    Elevates blood sugar   Gabapentin    Kidney doctor told me not to take   Nsaids    Kidney doctor told me not to take        Medication List     STOP taking these medications    aspirin 325 MG tablet   lisinopril-hydrochlorothiazide 20-12.5 MG tablet Commonly known as: ZESTORETIC       TAKE these medications    acarbose 100 MG tablet Commonly known as: PRECOSE Take 100 mg by mouth 3 (three) times daily with meals.   acetaminophen 325 MG tablet Commonly known as: TYLENOL Take 2  tablets (650 mg total) by mouth every 6 (six) hours as needed for mild pain (or Fever >/= 101).   atorvastatin 20 MG tablet Commonly known as: LIPITOR Take 20 mg by mouth daily.   FreeStyle Libre 2 Sensor Misc Apply topically.   glipiZIDE 10 MG tablet Commonly known as: GLUCOTROL Take 10 mg by mouth 2 (two) times daily before a meal.   GNP Calcium 1200 1200-1000 MG-UNIT Chew Chew 1,200 mg by mouth daily with breakfast. Take in combination with vitamin D and magnesium.   Jardiance 25 MG Tabs tablet Generic drug: empagliflozin Take 25 mg by mouth every morning.   lisinopril 20 MG tablet Commonly known as: ZESTRIL Take 1 tablet (20 mg total) by mouth daily.   metFORMIN 500 MG 24 hr tablet Commonly known as: GLUCOPHAGE-XR Take 500 mg by mouth daily.   nystatin powder Commonly known as: MYCOSTATIN/NYSTOP Apply 1 Application topically 2 (two) times daily.   Ozempic (0.25 or 0.5 MG/DOSE) 2 MG/3ML Sopn Generic drug: Semaglutide(0.25 or 0.5MG /DOS) Inject 0.25 mg into the skin once a week.   Evaristo Bury FlexTouch 200 UNIT/ML FlexTouch Pen Generic drug: insulin degludec Inject 40 Units into the skin daily.   triamcinolone cream 0.1 % Commonly known as: KENALOG Apply 1 Application topically 3 (three) times daily.       Allergies  Allergen Reactions   Cortisone     Elevates blood sugar   Gabapentin  Kidney doctor told me not to take   Nsaids     Kidney doctor told me not to take   Discharge Instructions     Call MD for:  difficulty breathing, headache or visual disturbances   Complete by: As directed    Call MD for:  extreme fatigue   Complete by: As directed    Call MD for:  persistant dizziness or light-headedness   Complete by: As directed    Call MD for:  persistant nausea and vomiting   Complete by: As directed    Call MD for:  severe uncontrolled pain   Complete by: As directed    Diet - low sodium heart healthy   Complete by: As directed    Discharge  instructions   Complete by: As directed    Follow-up with PCP in 1 week, continue to monitor BP and titrate medications accordingly.  Discontinued hydrochlorothiazide due to dehydration and AKI.  Continue lisinopril.  Repeat LFTs after 1 week Follow-up with GI as an outpatient of recurrent vomiting and abdominal pain   Increase activity slowly   Complete by: As directed        The results of significant diagnostics from this hospitalization (including imaging, microbiology, ancillary and laboratory) are listed below for reference.    Significant Diagnostic Studies: NM Hepatobiliary Liver Func  Result Date: 01/30/2023 CLINICAL DATA:  Biliary dilatation on recent MRI. Remote history of cholecystectomy. EXAM: NUCLEAR MEDICINE HEPATOBILIARY IMAGING TECHNIQUE: Sequential images of the abdomen were obtained out to 60 minutes following intravenous administration of radiopharmaceutical. RADIOPHARMACEUTICALS:  7.55 mCi Tc-22m  Choletec IV COMPARISON:  MR abdomen 01/28/2023 FINDINGS: Symmetric uptake in the liver and slow excretion into the biliary tree. The common bile duct is visualized at 36 minutes. Activity is seen in the small bowel at 51 minutes. IMPRESSION: Patent common bile duct.  No findings for obstruction. Electronically Signed   By: Rudie Meyer M.D.   On: 01/30/2023 14:21   MR ABDOMEN MRCP WO CONTRAST  Result Date: 01/28/2023 CLINICAL DATA:  Right upper quadrant abdominal pain, status post cholecystectomy EXAM: MRI ABDOMEN WITHOUT CONTRAST  (INCLUDING MRCP) TECHNIQUE: Multiplanar multisequence MR imaging of the abdomen was performed. Heavily T2-weighted images of the biliary and pancreatic ducts were obtained, and three-dimensional MRCP images were rendered by post processing. COMPARISON:  CT abdomen pelvis, 01/28/2023 MR abdomen, 12/24/2013 FINDINGS: Lower chest: No acute abnormality. Hepatobiliary: No focal liver abnormality is seen. Status post cholecystectomy. Severe, predominantly  extrahepatic biliary ductal dilatation, the common bile duct measuring up to 2.2 cm in caliber although tapering smoothly to the ampulla (series 3, image 22). No calculus or other obstructing etiology identified. Pancreas: Unremarkable. No pancreatic ductal dilatation or surrounding inflammatory changes. Spleen: Normal in size without significant abnormality. Adrenals/Urinary Tract: Adrenal glands are unremarkable. Kidneys are normal, without obvious renal calculi, solid lesion, or hydronephrosis. Stomach/Bowel: Stomach is within normal limits. No evidence of bowel wall thickening, distention, or inflammatory changes. Descending and sigmoid diverticulosis. Vascular/Lymphatic: No significant vascular findings are present. No enlarged abdominal lymph nodes. Other: No abdominal wall hernia or abnormality. No ascites. Musculoskeletal: No acute or significant osseous findings. IMPRESSION: 1. Status post cholecystectomy. Severe, predominantly extrahepatic biliary ductal dilatation, the common bile duct measuring up to 2.2 cm in caliber although tapering smoothly to the ampulla. No calculus or other obstructing etiology identified. This is generally greater than expected degree of postoperative biliary ductal dilatation, although this may nonetheless reflect benign postoperative biliary ductal dilatation. Consider nuclear scintigraphic HIDA  or ERCP to further assess for patency of the common bile duct if there is clinical concern for obstruction. 2. Descending and sigmoid diverticulosis. Electronically Signed   By: Jearld Lesch M.D.   On: 01/28/2023 21:44   MR 3D Recon At Scanner  Result Date: 01/28/2023 CLINICAL DATA:  Right upper quadrant abdominal pain, status post cholecystectomy EXAM: MRI ABDOMEN WITHOUT CONTRAST  (INCLUDING MRCP) TECHNIQUE: Multiplanar multisequence MR imaging of the abdomen was performed. Heavily T2-weighted images of the biliary and pancreatic ducts were obtained, and three-dimensional MRCP  images were rendered by post processing. COMPARISON:  CT abdomen pelvis, 01/28/2023 MR abdomen, 12/24/2013 FINDINGS: Lower chest: No acute abnormality. Hepatobiliary: No focal liver abnormality is seen. Status post cholecystectomy. Severe, predominantly extrahepatic biliary ductal dilatation, the common bile duct measuring up to 2.2 cm in caliber although tapering smoothly to the ampulla (series 3, image 22). No calculus or other obstructing etiology identified. Pancreas: Unremarkable. No pancreatic ductal dilatation or surrounding inflammatory changes. Spleen: Normal in size without significant abnormality. Adrenals/Urinary Tract: Adrenal glands are unremarkable. Kidneys are normal, without obvious renal calculi, solid lesion, or hydronephrosis. Stomach/Bowel: Stomach is within normal limits. No evidence of bowel wall thickening, distention, or inflammatory changes. Descending and sigmoid diverticulosis. Vascular/Lymphatic: No significant vascular findings are present. No enlarged abdominal lymph nodes. Other: No abdominal wall hernia or abnormality. No ascites. Musculoskeletal: No acute or significant osseous findings. IMPRESSION: 1. Status post cholecystectomy. Severe, predominantly extrahepatic biliary ductal dilatation, the common bile duct measuring up to 2.2 cm in caliber although tapering smoothly to the ampulla. No calculus or other obstructing etiology identified. This is generally greater than expected degree of postoperative biliary ductal dilatation, although this may nonetheless reflect benign postoperative biliary ductal dilatation. Consider nuclear scintigraphic HIDA or ERCP to further assess for patency of the common bile duct if there is clinical concern for obstruction. 2. Descending and sigmoid diverticulosis. Electronically Signed   By: Jearld Lesch M.D.   On: 01/28/2023 21:44   US Abdomen Limited RUQ (LIVER/GB)  Result Date: 01/28/2023 CLINICAL DATA:  Abdominal pain. EXAM: ULTRASOUND  ABDOMEN LIMITED RIGHT UPPER QUADRANT COMPARISON:  CT earlier today, concurrent MRCP FINDINGS: Gallbladder: Surgically absent. Common bile duct: Diameter: Dilated at 18 mm, this is unchanged from the earlier CT. Liver: No focal lesion identified. Within normal limits in parenchymal echogenicity. Portal vein is patent on color Doppler imaging with normal direction of blood flow towards the liver. Other: No right upper quadrant ascites. IMPRESSION: 1. Stable biliary dilatation postcholecystectomy. 2. Unremarkable sonographic appearance of the liver. Electronically Signed   By: Narda Rutherford M.D.   On: 01/28/2023 21:41   CT ABDOMEN PELVIS WO CONTRAST  Result Date: 01/28/2023 CLINICAL DATA:  Abdominal pain and vomiting. EXAM: CT ABDOMEN AND PELVIS WITHOUT CONTRAST TECHNIQUE: Multidetector CT imaging of the abdomen and pelvis was performed following the standard protocol without IV contrast. RADIATION DOSE REDUCTION: This exam was performed according to the departmental dose-optimization program which includes automated exposure control, adjustment of the mA and/or kV according to patient size and/or use of iterative reconstruction technique. COMPARISON:  Prior CT scan 12/23/2013 of the abdomen and pelvis. FINDINGS: Lower chest: There is some linear opacity seen along bases likely scar or atelectasis. No pleural effusion. Scattered bronchiectasis. Coronary artery calcifications are seen. Hepatobiliary: Previous cholecystectomy. Stable dilated common duct measuring up to 20 mm. Previously 18 mm. Normal tapering towards the pancreatic head. Preserved hepatic parenchyma on this noncontrast exam. Pancreas: Unremarkable. No pancreatic ductal dilatation or surrounding inflammatory  changes. Spleen: Normal in size without focal abnormality. Adrenals/Urinary Tract: The adrenal glands are preserved. No abnormal calcifications are seen within either kidney nor along the course of either ureter. 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 Stomach/Bowel: Stomach is mildly distended with fluid. The large bowel is nondilated. Left-sided colonic diverticulosis greater than right. The appendix is not clearly seen in the right lower quadrant. No pericecal stranding or fluid. There is a loop of small bowel in the left midabdomen which has some mild wall thickening and there is some adjacent mesenteric stranding as seen on axial series 2, image 48 and coronal series 5, image 38. No dilatation. This could represent any area of enteritis. There are a few small mesenteric nodes. Vascular/Lymphatic: Normal caliber aorta and IVC with mild vascular calcifications. No specific abnormal lymph node enlargement seen in the abdomen and pelvis. Reproductive: Uterus and bilateral adnexa are unremarkable. Other: No free air or free fluid. 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 Healed traumatic deformities along the left pubic bone. Moderate degenerative changes along the lumbar spine. Transitional lumbosacral segment. Trace listhesis of L3 on L4. IMPRESSION: Subtle area of fold thickening or wall thickening along a short segment loop of jejunum in the left midabdomen with some adjacent fat stranding. Please correlate for focal enteritis. If needed a follow up IV contrast CT may be useful to further delineate the walls of the bowel. Colonic diverticulosis.  No obstruction or free air. No obstructing renal stone. Previous cholecystectomy with a dilated biliary tree but this was seen on the study of 2015. Please correlate with clinical history Electronically Signed   By: Karen Kays M.D.   On: 01/28/2023 17:25    Microbiology: No results found for this or any previous visit (from the past 240 hour(s)).   Labs: CBC: Recent Labs  Lab 01/28/23 1458 01/29/23 0533 01/30/23 0755 01/31/23 0455  WBC 24.1*  16.3* 7.5 5.1  HGB 13.1 11.0* 10.9* 9.8*  HCT 41.2 34.5* 34.3* 30.7*  MCV 90.4 90.6 90.5 90.0  PLT 324 266 250 225   Basic Metabolic Panel: Recent Labs  Lab 01/28/23 1458 01/29/23 0533 01/30/23 0755 01/31/23 0455  NA 138 140 136 138  K 4.5 3.7 3.9 3.9  CL 103 112* 111 114*  CO2 22 24 18* 20*  GLUCOSE 291* 135* 138* 171*  BUN 46* 40* 21 15  CREATININE 1.59* 1.53* 1.16* 1.14*  CALCIUM 9.5 8.3* 8.3* 8.0*  MG  --  2.2 2.0 1.8  PHOS  --  3.5 2.2* 2.7   Liver Function Tests: Recent Labs  Lab 01/28/23 1458 01/29/23 0533 01/30/23 0755 01/31/23 0455  AST 696* 584* 247* 127*  ALT 311* 452* 311* 201*  ALKPHOS 499* 463* 446* 395*  BILITOT 2.9* 3.7* 3.8* 2.0*  PROT 9.4* 7.8 7.4 6.7  ALBUMIN 3.9 3.1* 3.0* 2.6*   Recent Labs  Lab 01/28/23 1458 01/30/23 0755  LIPASE 59* 47   No results for input(s): "AMMONIA" in the last 168 hours. Cardiac Enzymes: No results for input(s): "CKTOTAL", "CKMB", "CKMBINDEX", "TROPONINI" in the last 168 hours. BNP (last 3 results) No results for input(s): "BNP" in the last 8760 hours. CBG: Recent Labs  Lab 01/30/23 1142 01/30/23 1622 01/30/23 2104 01/31/23 0839 01/31/23 1158  GLUCAP 108* 148* 148* 173* 161*    Time spent: 35 minutes  Signed:  Gillis Santa  [facility] 01/31/2023 12:22  PM

## 2023-02-10 DIAGNOSIS — N2581 Secondary hyperparathyroidism of renal origin: Secondary | ICD-10-CM | POA: Insufficient documentation

## 2023-02-11 DIAGNOSIS — E1151 Type 2 diabetes mellitus with diabetic peripheral angiopathy without gangrene: Secondary | ICD-10-CM | POA: Insufficient documentation

## 2023-07-23 ENCOUNTER — Other Ambulatory Visit: Payer: Self-pay | Admitting: Family Medicine

## 2023-07-23 DIAGNOSIS — M810 Age-related osteoporosis without current pathological fracture: Secondary | ICD-10-CM

## 2023-08-29 ENCOUNTER — Other Ambulatory Visit: Payer: Self-pay | Admitting: Family Medicine

## 2023-08-29 DIAGNOSIS — Z1231 Encounter for screening mammogram for malignant neoplasm of breast: Secondary | ICD-10-CM

## 2023-11-07 ENCOUNTER — Ambulatory Visit: Payer: Medicaid Other | Admitting: Internal Medicine

## 2023-12-09 ENCOUNTER — Ambulatory Visit
Admission: RE | Admit: 2023-12-09 | Discharge: 2023-12-09 | Disposition: A | Discharge status: Home / Self Care | Payer: Medicare HMO | Source: Ambulatory Visit | Attending: Family Medicine | Admitting: Family Medicine

## 2023-12-09 DIAGNOSIS — M810 Age-related osteoporosis without current pathological fracture: Secondary | ICD-10-CM | POA: Diagnosis present

## 2023-12-09 DIAGNOSIS — Z1231 Encounter for screening mammogram for malignant neoplasm of breast: Secondary | ICD-10-CM | POA: Insufficient documentation

## 2024-04-08 ENCOUNTER — Other Ambulatory Visit: Payer: Self-pay | Admitting: Internal Medicine

## 2024-04-08 DIAGNOSIS — E1169 Type 2 diabetes mellitus with other specified complication: Secondary | ICD-10-CM

## 2024-04-15 ENCOUNTER — Other Ambulatory Visit

## 2024-05-06 ENCOUNTER — Ambulatory Visit
Admission: RE | Admit: 2024-05-06 | Discharge: 2024-05-06 | Disposition: A | Discharge status: Home / Self Care | Source: Ambulatory Visit | Attending: Internal Medicine | Admitting: Internal Medicine

## 2024-05-06 DIAGNOSIS — E1169 Type 2 diabetes mellitus with other specified complication: Secondary | ICD-10-CM | POA: Insufficient documentation

## 2024-05-06 MED ORDER — TECHNETIUM TC 99M SULFUR COLLOID
2.0000 | Freq: Once | INTRAVENOUS | Status: AC | PRN
  Administered 2024-05-06: 2.18 via INTRAVENOUS

## 2024-05-07 ENCOUNTER — Other Ambulatory Visit

## 2024-06-24 ENCOUNTER — Inpatient Hospital Stay
Admission: EM | Admit: 2024-06-24 | Discharge: 2024-06-26 | DRG: 445 | Disposition: A | Discharge status: Home / Self Care | Attending: Osteopathic Medicine | Admitting: Osteopathic Medicine

## 2024-06-24 ENCOUNTER — Observation Stay

## 2024-06-24 ENCOUNTER — Other Ambulatory Visit: Payer: Self-pay

## 2024-06-24 DIAGNOSIS — I10 Essential (primary) hypertension: Secondary | ICD-10-CM | POA: Diagnosis present

## 2024-06-24 DIAGNOSIS — K759 Inflammatory liver disease, unspecified: Principal | ICD-10-CM

## 2024-06-24 DIAGNOSIS — R7401 Elevation of levels of liver transaminase levels: Secondary | ICD-10-CM | POA: Diagnosis present

## 2024-06-24 DIAGNOSIS — Z888 Allergy status to other drugs, medicaments and biological substances status: Secondary | ICD-10-CM

## 2024-06-24 DIAGNOSIS — E785 Hyperlipidemia, unspecified: Secondary | ICD-10-CM | POA: Diagnosis present

## 2024-06-24 DIAGNOSIS — Z6830 Body mass index (BMI) 30.0-30.9, adult: Secondary | ICD-10-CM

## 2024-06-24 DIAGNOSIS — E1122 Type 2 diabetes mellitus with diabetic chronic kidney disease: Secondary | ICD-10-CM | POA: Diagnosis present

## 2024-06-24 DIAGNOSIS — Z794 Long term (current) use of insulin: Secondary | ICD-10-CM

## 2024-06-24 DIAGNOSIS — Z79899 Other long term (current) drug therapy: Secondary | ICD-10-CM

## 2024-06-24 DIAGNOSIS — Z7984 Long term (current) use of oral hypoglycemic drugs: Secondary | ICD-10-CM

## 2024-06-24 DIAGNOSIS — Z8673 Personal history of transient ischemic attack (TIA), and cerebral infarction without residual deficits: Secondary | ICD-10-CM

## 2024-06-24 DIAGNOSIS — Z7985 Long-term (current) use of injectable non-insulin antidiabetic drugs: Secondary | ICD-10-CM

## 2024-06-24 DIAGNOSIS — Z96643 Presence of artificial hip joint, bilateral: Secondary | ICD-10-CM | POA: Diagnosis present

## 2024-06-24 DIAGNOSIS — Z9049 Acquired absence of other specified parts of digestive tract: Secondary | ICD-10-CM

## 2024-06-24 DIAGNOSIS — B179 Acute viral hepatitis, unspecified: Secondary | ICD-10-CM | POA: Diagnosis present

## 2024-06-24 DIAGNOSIS — K5730 Diverticulosis of large intestine without perforation or abscess without bleeding: Secondary | ICD-10-CM | POA: Diagnosis present

## 2024-06-24 DIAGNOSIS — Z8249 Family history of ischemic heart disease and other diseases of the circulatory system: Secondary | ICD-10-CM

## 2024-06-24 DIAGNOSIS — K8050 Calculus of bile duct without cholangitis or cholecystitis without obstruction: Secondary | ICD-10-CM | POA: Diagnosis not present

## 2024-06-24 DIAGNOSIS — E66811 Obesity, class 1: Secondary | ICD-10-CM | POA: Diagnosis present

## 2024-06-24 DIAGNOSIS — E1165 Type 2 diabetes mellitus with hyperglycemia: Secondary | ICD-10-CM | POA: Diagnosis present

## 2024-06-24 DIAGNOSIS — Z886 Allergy status to analgesic agent status: Secondary | ICD-10-CM

## 2024-06-24 DIAGNOSIS — Z87891 Personal history of nicotine dependence: Secondary | ICD-10-CM

## 2024-06-24 DIAGNOSIS — N179 Acute kidney failure, unspecified: Secondary | ICD-10-CM | POA: Diagnosis present

## 2024-06-24 LAB — CBC
HCT: 40.3 % (ref 36.0–46.0)
Hemoglobin: 13.2 g/dL (ref 12.0–15.0)
MCH: 29.5 pg (ref 26.0–34.0)
MCHC: 32.8 g/dL (ref 30.0–36.0)
MCV: 90.2 fL (ref 80.0–100.0)
Platelets: 218 K/uL (ref 150–400)
RBC: 4.47 MIL/uL (ref 3.87–5.11)
RDW: 13.8 % (ref 11.5–15.5)
WBC: 12.7 K/uL — ABNORMAL HIGH (ref 4.0–10.5)
nRBC: 0 % (ref 0.0–0.2)

## 2024-06-24 LAB — COMPREHENSIVE METABOLIC PANEL WITH GFR
ALT: 851 U/L — ABNORMAL HIGH (ref 0–44)
AST: 1609 U/L — ABNORMAL HIGH (ref 15–41)
Albumin: 4 g/dL (ref 3.5–5.0)
Alkaline Phosphatase: 367 U/L — ABNORMAL HIGH (ref 38–126)
Anion gap: 12 (ref 5–15)
BUN: 29 mg/dL — ABNORMAL HIGH (ref 8–23)
CO2: 22 mmol/L (ref 22–32)
Calcium: 9.7 mg/dL (ref 8.9–10.3)
Chloride: 104 mmol/L (ref 98–111)
Creatinine, Ser: 1.41 mg/dL — ABNORMAL HIGH (ref 0.44–1.00)
GFR, Estimated: 38 mL/min — ABNORMAL LOW (ref >60)
Glucose, Bld: 216 mg/dL — ABNORMAL HIGH (ref 70–99)
Potassium: 4 mmol/L (ref 3.5–5.1)
Sodium: 138 mmol/L (ref 135–145)
Total Bilirubin: 3 mg/dL — ABNORMAL HIGH (ref 0.0–1.2)
Total Protein: 9.4 g/dL — ABNORMAL HIGH (ref 6.5–8.1)

## 2024-06-24 LAB — PROTIME-INR
INR: 1.1 (ref 0.8–1.2)
Prothrombin Time: 14.8 s (ref 11.4–15.2)

## 2024-06-24 LAB — URINALYSIS, ROUTINE W REFLEX MICROSCOPIC
Bacteria, UA: NONE SEEN
Bilirubin Urine: NEGATIVE
Glucose, UA: >=500 mg/dL — AB
Hgb urine dipstick: NEGATIVE
Ketones, ur: NEGATIVE mg/dL
Leukocytes,Ua: NEGATIVE
Nitrite: NEGATIVE
Protein, ur: NEGATIVE mg/dL
Specific Gravity, Urine: 1.024 (ref 1.005–1.030)
pH: 5 (ref 5.0–8.0)

## 2024-06-24 LAB — APTT: aPTT: 28 s (ref 24–36)

## 2024-06-24 LAB — LIPASE, BLOOD: Lipase: 65 U/L — ABNORMAL HIGH (ref 11–51)

## 2024-06-24 MED ORDER — SODIUM CHLORIDE 0.9 % IV SOLN: INTRAVENOUS | Status: AC

## 2024-06-24 MED ORDER — INSULIN ASPART 100 UNIT/ML IJ SOLN
0.0000 [IU] | Freq: Three times a day (TID) | INTRAMUSCULAR | Status: DC
  Administered 2024-06-26 (×2): 1 [IU] via SUBCUTANEOUS
  Filled 2024-06-24 (×2): qty 1

## 2024-06-24 MED ORDER — HEPARIN SODIUM (PORCINE) 5000 UNIT/ML IJ SOLN
5000.0000 [IU] | Freq: Three times a day (TID) | INTRAMUSCULAR | Status: DC
  Administered 2024-06-24 – 2024-06-26 (×3): 5000 [IU] via SUBCUTANEOUS
  Filled 2024-06-24 (×4): qty 1

## 2024-06-24 MED ORDER — MORPHINE SULFATE (PF) 2 MG/ML IV SOLN: 2.0000 mg | INTRAVENOUS | Status: DC | PRN

## 2024-06-24 MED ORDER — SODIUM CHLORIDE 0.9 % IV SOLN: Freq: Once | INTRAVENOUS | Status: AC

## 2024-06-24 MED ORDER — ONDANSETRON HCL 4 MG/2ML IJ SOLN
4.0000 mg | Freq: Once | INTRAMUSCULAR | Status: AC
  Administered 2024-06-24: 4 mg via INTRAVENOUS
  Filled 2024-06-24: qty 2

## 2024-06-24 MED ORDER — INSULIN ASPART 100 UNIT/ML IJ SOLN: 0.0000 [IU] | Freq: Every day | INTRAMUSCULAR | Status: DC

## 2024-06-24 MED ORDER — AMLODIPINE BESYLATE 5 MG PO TABS
5.0000 mg | ORAL_TABLET | Freq: Every day | ORAL | Status: DC
  Administered 2024-06-26: 5 mg via ORAL
  Filled 2024-06-24: qty 1

## 2024-06-24 MED ORDER — FENTANYL CITRATE PF 50 MCG/ML IJ SOSY
50.0000 ug | PREFILLED_SYRINGE | Freq: Once | INTRAMUSCULAR | Status: AC
  Administered 2024-06-24: 50 ug via INTRAVENOUS
  Filled 2024-06-24: qty 1

## 2024-06-24 NOTE — ED Triage Notes (Signed)
 Patient states abdominal pain and vomiting since this morning; denies known sick contacts.

## 2024-06-24 NOTE — ED Provider Notes (Signed)
 [facility] Provider Note    Event Date/Time   First MD Initiated Contact with Patient 06/24/24 1831     (approximate)   History   Abdominal Pain   HPI  [name] [name] is a 78 y.o. female presents to the emergency department today because of concerns for abdominal pain nausea and vomiting.  Symptoms started this morning and been persistent throughout the day.  Patient has had similar symptoms in the past.  Per chart review she was admitted last year for acute hepatitis.  She had significant workup at that time without any clear etiology.  Patient denies any fevers or chills.  She does occasionally take Tylenol  although no significant Tylenol  ingestions recently.     Physical Exam   Triage Vital Signs: ED Triage Vitals  Encounter Vitals Group     BP 06/24/24 1734 131/74     Girls Systolic BP Percentile --      Girls Diastolic BP Percentile --      Boys Systolic BP Percentile --      Boys Diastolic BP Percentile --      Pulse Rate 06/24/24 1734 (!) 105     Resp 06/24/24 1734 20     Temp 06/24/24 1734 98.6 F (37 C)     Temp Source 06/24/24 1734 Oral     SpO2 06/24/24 1734 100 %     Weight --      Height --      Head Circumference --      Peak Flow --      Pain Score 06/24/24 1735 10     Pain Loc --      Pain Education --      Exclude from Growth Chart --     Most recent vital signs: Vitals:   06/24/24 1834 06/24/24 1836  BP: 131/66   Pulse:  99  Resp:    Temp:    SpO2:  100%   General: Awake, alert, oriented. CV:  Good peripheral perfusion. Regular rate and rhythm. Resp:  Normal effort. Lungs clear. Abd:  No distention. Non tender.   ED Results / Procedures / Treatments   Labs (all labs ordered are listed, but only abnormal results are displayed) Labs Reviewed  COMPREHENSIVE METABOLIC PANEL WITH GFR - Abnormal; Notable for the following components:      Result Value   Glucose, Bld 216 (*)    BUN 29 (*)    Creatinine, Ser  1.41 (*)    Total Protein 9.4 (*)    AST 1,609 (*)    ALT 851 (*)    Alkaline Phosphatase 367 (*)    Total Bilirubin 3.0 (*)    GFR, Estimated 38 (*)    All other components within normal limits  CBC - Abnormal; Notable for the following components:   WBC 12.7 (*)    All other components within normal limits  URINALYSIS, ROUTINE W REFLEX MICROSCOPIC - Abnormal; Notable for the following components:   Color, Urine AMBER (*)    APPearance CLEAR (*)    Glucose, UA >=500 (*)    All other components within normal limits  APTT  PROTIME-INR  LIPASE, BLOOD  HEPATITIS PANEL, ACUTE  CBC  CREATININE, SERUM  BASIC METABOLIC PANEL WITH GFR  CBC     EKG  None   RADIOLOGY None   PROCEDURES:  Critical Care performed: No    MEDICATIONS ORDERED IN ED: Medications - No data to display   IMPRESSION /  MDM / ASSESSMENT AND PLAN / ED COURSE  I reviewed the triage vital signs and the nursing notes.                              Differential diagnosis includes, but is not limited to, biliary obstruction, hepatitis, pancreatitis  Patient's presentation is most consistent with acute presentation with potential threat to life or bodily function.  Patient presented to the emergency department today because concerns for right upper quadrant abdominal pain.  Patient's blood work was notable for significant LFT elevations.  Per chart review patient has had acute hepatitis in the past.  Had extensive workup during previous admission for this problem.  Discussed with Dr. Unk with GI recommended admission. Will give IV fluids. Discussed with Dr. Dorinda with the hospitalist service who will evaluate for admission.    FINAL CLINICAL IMPRESSION(S) / ED DIAGNOSES   Final diagnoses:  Hepatitis        Rx / DC Orders   ED Discharge Orders     None        Note:  This document was prepared using Dragon voice recognition software and may include unintentional dictation errors.     Floy Roberts, MD 06/24/24 2229

## 2024-06-24 NOTE — H&P (Signed)
 History and Physical    Patient: [name] [name] FMW:[id number] DOB: 05-26-1946 DOA: 06/24/2024 DOS: the patient was seen and examined on 06/24/2024 PCP: Entzminger, Ethridge LABOR, MD  Patient coming from: Home  Chief Complaint: Abdominal pain Chief Complaint  Patient presents with  . Abdominal Pain   HPI: [name] [name] is a 78 y.o. female with medical history significant of hypertension, hyperlipidemia, diabetes, previous stroke with no residual weakness who was otherwise well until this morning when she started experiencing nausea and vomiting with associated abdominal pain.  According to patient she vomited about 5 times with abdominal pain intensity of 10/10 not localized.  Pain relieved on its own.  At the time patient was being seen she denied cough chest pain urinary complaint or diarrhea.  She does not abuse alcohol and the last alcohol use according to her was 10 years ago.  ED course: Vitals: Temperature 98.6, respiratory rate 20, pulse 105, BP 131/74 saturating 100% on room air. Labs were significant for elevated liver enzymes. CT scan of the abdomen showing marked common bile duct dilation with choledocholithiasis. Case discussed with gastroenterologist and ERCP being planned  Review of Systems: As mentioned in the history of present illness. All other systems reviewed and are negative. Past Medical History:  Diagnosis Date  . Arthritis   . Diabetes mellitus without complication (HCC)   . Hypertension   . Stroke (HCC)    light stoke No residual deficits   Past Surgical History:  Procedure Laterality Date  . CHOLECYSTECTOMY    . HIP SURGERY    . JOINT REPLACEMENT     left hip  . TOTAL HIP ARTHROPLASTY Right 05/05/2017   Procedure: TOTAL HIP ARTHROPLASTY ANTERIOR APPROACH;  Surgeon: Leora Lynwood SAUNDERS, MD;  Location: [facility] ORS;  Service: Orthopedics;  Laterality: Right;   Social History:  reports that she quit smoking about 12 years ago. She has never used smokeless  tobacco. She reports that she does not drink alcohol and does not use drugs.  Allergies  Allergen Reactions  . Cortisone     Elevates blood sugar  . Gabapentin      Kidney doctor told me not to take  . Nsaids     Kidney doctor told me not to take    Family History  Problem Relation Age of Onset  . Heart disease Mother   . Alcohol abuse Father   . Breast cancer Neg Hx     Prior to Admission medications   Medication Sig Start Date End Date Taking? Authorizing Provider  acarbose  (PRECOSE ) 100 MG tablet Take 100 mg by mouth 3 (three) times daily with meals.     [provider]  acetaminophen  (TYLENOL ) 325 MG tablet Take 2 tablets (650 mg total) by mouth every 6 (six) hours as needed for mild pain (or Fever >/= 101). 05/07/17   Leora Lynwood SAUNDERS, MD  atorvastatin  (LIPITOR) 20 MG tablet Take 20 mg by mouth daily.     [provider]  Calcium  Carbonate-Vit D-Min (GNP CALCIUM  1200) 1200-1000 MG-UNIT CHEW Chew 1,200 mg by mouth daily with breakfast. Take in combination with vitamin D  and magnesium . 08/18/18 02/14/19  Tanya Glisson, MD  Continuous Glucose Sensor (FREESTYLE LIBRE 2 SENSOR) MISC Apply topically. 01/08/23   [provider]  glipiZIDE  (GLUCOTROL ) 10 MG tablet Take 10 mg by mouth 2 (two) times daily before a meal. 06/14/15   [provider]  JARDIANCE  25 MG TABS tablet Take 25 mg by mouth every morning.  [provider]  lisinopril  (ZESTRIL ) 20 MG tablet Take 1 tablet (20 mg total) by mouth daily. 01/31/23 01/31/24  Von Bellis, MD  metFORMIN (GLUCOPHAGE-XR) 500 MG 24 hr tablet Take 500 mg by mouth daily.    [provider]  nystatin  (MYCOSTATIN /NYSTOP ) powder Apply 1 Application topically 2 (two) times daily. 01/21/23   [provider]  OZEMPIC, 0.25 OR 0.5 MG/DOSE, 2 MG/3ML SOPN Inject 0.25 mg into the skin once a week. 01/21/23   [provider]  TRESIBA FLEXTOUCH 200 UNIT/ML FlexTouch Pen Inject 40 Units into  the skin daily. 01/09/23   [provider]  triamcinolone  cream (KENALOG ) 0.1 % Apply 1 Application topically 3 (three) times daily. 01/21/23   [provider]    Physical Exam: Vitals:   06/24/24 1734 06/24/24 1834 06/24/24 1836 06/24/24 2118  BP: 131/74 131/66  112/70  Pulse: (!) 105  99 (!) 103  Resp: 20   16  Temp: 98.6 F (37 C)   98.2 F (36.8 C)  TempSrc: Oral   Oral  SpO2: 100%  100% 99%   Cardiovascular:     Rate and Rhythm: Normal rate and regular rhythm.     Pulses: Normal pulses.     Heart sounds: Normal heart sounds.  Pulmonary:     Effort: Pulmonary effort is normal. No respiratory distress.     Breath sounds: Normal breath sounds. No wheezing, rhonchi or rales.  Abdominal:     General: Abdomen is flat. Bowel sounds are normal. There is no distension.     Palpations: Abdomen is soft.     Tenderness: There is no abdominal tenderness.  Musculoskeletal:        General: No deformity. Normal range of motion.  Skin:    General: Skin is warm and dry.   Neurological:     General: No focal deficit present.     Mental Status: He is alert and oriented to person, place, and time. Mental status is at baseline.   Data Reviewed: CT scan of the abdomen showed mild common bile duct dilation with choledocholithiasis    Latest Ref Rng & Units 06/24/2024    5:37 PM 01/31/2023    4:55 AM 01/30/2023    7:55 AM  CBC  WBC 4.0 - 10.5 K/uL 12.7  5.1  7.5   Hemoglobin 12.0 - 15.0 g/dL 86.7  9.8  89.0   Hematocrit 36.0 - 46.0 % 40.3  30.7  34.3   Platelets 150 - 400 K/uL 218  225  250        Latest Ref Rng & Units 06/24/2024    5:37 PM 01/31/2023    4:55 AM 01/30/2023    7:55 AM  BMP  Glucose 70 - 99 mg/dL 783  828  861   BUN 8 - 23 mg/dL 29  15  21    Creatinine 0.44 - 1.00 mg/dL 8.58  8.85  8.83   Sodium 135 - 145 mmol/L 138  138  136   Potassium 3.5 - 5.1 mmol/L 4.0  3.9  3.9   Chloride 98 - 111 mmol/L 104  114  111   CO2 22 - 32 mmol/L 22  20  18    Calcium   8.9 - 10.3 mg/dL 9.7  8.0  8.3      Assessment and Plan:  Acute transaminitis in the setting of choledocholithiasis CT scan of the abdomen showed marked common bile duct dilation (3.1 cm) with choledocholithiasis and mild intrahepatic biliary ductal dilation. AST  09/29/2007, ALT 851, alkaline phosphatase 367 lipase 65. I discussed the case with gastroenterologist Dr. Unk I believe patient will need ERCP  I will make n.p.o. after midnight Placed on as needed pain medication Follow-up on viral hepatitis panel  AKI Creatinine around 1.1 a year ago however presents with creatinine of 1.4 Continue supplemental IV fluid Monitor renal function closely  Essential hypertension I will hold lisinopril  in the setting of AKI Placed on amlodipine 5 mg daily  Hyperlipidemia Holding statin therapy in the setting of transaminitis  Diabetes mellitus type 2 with hyperglycemia Presented with glucose 216 Last A1c May 2024 9.3 Follow-up on A1c Initiated on sliding scale  History of stroke with no residual weakness Holding statin therapy in the setting of transaminitis  DVT prophylaxis-placed on heparin    Advance Care Planning:   Code Status: Full Code full code  Consults: Gastroenterologist  Family Communication: None at bedside  Severity of Illness: The appropriate patient status for this patient is OBSERVATION. Observation status is judged to be reasonable and necessary in order to provide the required intensity of service to ensure the patient's safety. The patient's presenting symptoms, physical exam findings, and initial radiographic and laboratory data in the context of their medical condition is felt to place them at decreased risk for further clinical deterioration. Furthermore, it is anticipated that the patient will be medically stable for discharge from the hospital within 2 midnights of admission.   Author: Drue ONEIDA Potter, MD 06/24/2024 10:24 PM  For on call review  [email or web address].

## 2024-06-24 NOTE — ED Notes (Signed)
 Recollected coags and sent to lab

## 2024-06-24 NOTE — ED Notes (Signed)
 Patient transported to CT

## 2024-06-24 NOTE — H&P (Incomplete)
 History and Physical    Patient: [name] [name] FMW:[id number] DOB: 05-26-1946 DOA: 06/24/2024 DOS: the patient was seen and examined on 06/24/2024 PCP: Entzminger, Ethridge LABOR, MD  Patient coming from: Home  Chief Complaint: Abdominal pain Chief Complaint  Patient presents with  . Abdominal Pain   HPI: [name] [name] is a 78 y.o. female with medical history significant of hypertension, hyperlipidemia, diabetes, previous stroke with no residual weakness who was otherwise well until this morning when she started experiencing nausea and vomiting with associated abdominal pain.  According to patient she vomited about 5 times with abdominal pain intensity of 10/10 not localized.  Pain relieved on its own.  At the time patient was being seen she denied cough chest pain urinary complaint or diarrhea.  She does not abuse alcohol and the last alcohol use according to her was 10 years ago.  ED course: Vitals: Temperature 98.6, respiratory rate 20, pulse 105, BP 131/74 saturating 100% on room air. Labs were significant for elevated liver enzymes. CT scan of the abdomen showing marked common bile duct dilation with choledocholithiasis. Case discussed with gastroenterologist and ERCP being planned  Review of Systems: As mentioned in the history of present illness. All other systems reviewed and are negative. Past Medical History:  Diagnosis Date  . Arthritis   . Diabetes mellitus without complication (HCC)   . Hypertension   . Stroke (HCC)    light stoke No residual deficits   Past Surgical History:  Procedure Laterality Date  . CHOLECYSTECTOMY    . HIP SURGERY    . JOINT REPLACEMENT     left hip  . TOTAL HIP ARTHROPLASTY Right 05/05/2017   Procedure: TOTAL HIP ARTHROPLASTY ANTERIOR APPROACH;  Surgeon: Leora Lynwood SAUNDERS, MD;  Location: [facility] ORS;  Service: Orthopedics;  Laterality: Right;   Social History:  reports that she quit smoking about 12 years ago. She has never used smokeless  tobacco. She reports that she does not drink alcohol and does not use drugs.  Allergies  Allergen Reactions  . Cortisone     Elevates blood sugar  . Gabapentin      Kidney doctor told me not to take  . Nsaids     Kidney doctor told me not to take    Family History  Problem Relation Age of Onset  . Heart disease Mother   . Alcohol abuse Father   . Breast cancer Neg Hx     Prior to Admission medications   Medication Sig Start Date End Date Taking? Authorizing Provider  acarbose  (PRECOSE ) 100 MG tablet Take 100 mg by mouth 3 (three) times daily with meals.     [provider]  acetaminophen  (TYLENOL ) 325 MG tablet Take 2 tablets (650 mg total) by mouth every 6 (six) hours as needed for mild pain (or Fever >/= 101). 05/07/17   Leora Lynwood SAUNDERS, MD  atorvastatin  (LIPITOR) 20 MG tablet Take 20 mg by mouth daily.     [provider]  Calcium  Carbonate-Vit D-Min (GNP CALCIUM  1200) 1200-1000 MG-UNIT CHEW Chew 1,200 mg by mouth daily with breakfast. Take in combination with vitamin D  and magnesium . 08/18/18 02/14/19  Tanya Glisson, MD  Continuous Glucose Sensor (FREESTYLE LIBRE 2 SENSOR) MISC Apply topically. 01/08/23   [provider]  glipiZIDE  (GLUCOTROL ) 10 MG tablet Take 10 mg by mouth 2 (two) times daily before a meal. 06/14/15   [provider]  JARDIANCE  25 MG TABS tablet Take 25 mg by mouth every morning.  [provider]  lisinopril  (ZESTRIL ) 20 MG tablet Take 1 tablet (20 mg total) by mouth daily. 01/31/23 01/31/24  Von Bellis, MD  metFORMIN (GLUCOPHAGE-XR) 500 MG 24 hr tablet Take 500 mg by mouth daily.    [provider]  nystatin  (MYCOSTATIN /NYSTOP ) powder Apply 1 Application topically 2 (two) times daily. 01/21/23   [provider]  OZEMPIC, 0.25 OR 0.5 MG/DOSE, 2 MG/3ML SOPN Inject 0.25 mg into the skin once a week. 01/21/23   [provider]  TRESIBA FLEXTOUCH 200 UNIT/ML FlexTouch Pen Inject 40 Units into  the skin daily. 01/09/23   [provider]  triamcinolone  cream (KENALOG ) 0.1 % Apply 1 Application topically 3 (three) times daily. 01/21/23   [provider]    Physical Exam: Vitals:   06/24/24 1734 06/24/24 1834 06/24/24 1836 06/24/24 2118  BP: 131/74 131/66  112/70  Pulse: (!) 105  99 (!) 103  Resp: 20   16  Temp: 98.6 F (37 C)   98.2 F (36.8 C)  TempSrc: Oral   Oral  SpO2: 100%  100% 99%   Cardiovascular:     Rate and Rhythm: Normal rate and regular rhythm.     Pulses: Normal pulses.     Heart sounds: Normal heart sounds.  Pulmonary:     Effort: Pulmonary effort is normal. No respiratory distress.     Breath sounds: Normal breath sounds. No wheezing, rhonchi or rales.  Abdominal:     General: Abdomen is flat. Bowel sounds are normal. There is no distension.     Palpations: Abdomen is soft.     Tenderness: There is no abdominal tenderness.  Musculoskeletal:        General: No deformity. Normal range of motion.  Skin:    General: Skin is warm and dry.   Neurological:     General: No focal deficit present.     Mental Status: He is alert and oriented to person, place, and time. Mental status is at baseline.   Data Reviewed: CT scan of the abdomen showed mild common bile duct dilation with choledocholithiasis    Latest Ref Rng & Units 06/24/2024    5:37 PM 01/31/2023    4:55 AM 01/30/2023    7:55 AM  CBC  WBC 4.0 - 10.5 K/uL 12.7  5.1  7.5   Hemoglobin 12.0 - 15.0 g/dL 86.7  9.8  89.0   Hematocrit 36.0 - 46.0 % 40.3  30.7  34.3   Platelets 150 - 400 K/uL 218  225  250        Latest Ref Rng & Units 06/24/2024    5:37 PM 01/31/2023    4:55 AM 01/30/2023    7:55 AM  BMP  Glucose 70 - 99 mg/dL 783  828  861   BUN 8 - 23 mg/dL 29  15  21    Creatinine 0.44 - 1.00 mg/dL 8.58  8.85  8.83   Sodium 135 - 145 mmol/L 138  138  136   Potassium 3.5 - 5.1 mmol/L 4.0  3.9  3.9   Chloride 98 - 111 mmol/L 104  114  111   CO2 22 - 32 mmol/L 22  20  18    Calcium   8.9 - 10.3 mg/dL 9.7  8.0  8.3      Assessment and Plan:  Acute transaminitis in the setting of choledocholithiasis CT scan of the abdomen showed marked common bile duct dilation (3.1 cm) with choledocholithiasis and mild intrahepatic biliary ductal dilation. AST  09/29/2007, ALT 851, alkaline phosphatase 367 lipase 65. I discussed the case with gastroenterologist Dr. Unk I believe patient will need ERCP  I will make n.p.o. after midnight Placed on as needed pain medication Follow-up on viral hepatitis panel  AKI Creatinine around 1.1 a year ago however presents with creatinine of 1.4 Continue supplemental IV fluid Monitor renal function closely  Essential hypertension I will hold lisinopril  in the setting of AKI Placed on amlodipine 5 mg daily  Hyperlipidemia Holding statin therapy in the setting of transaminitis  Diabetes mellitus type 2 with hyperglycemia Presented with glucose 216 Last A1c May 2024 9.3 Follow-up on A1c Initiated on sliding scale  History of stroke with no residual weakness Holding statin therapy in the setting of transaminitis  DVT prophylaxis-placed on heparin    Advance Care Planning:   Code Status: Full Code full code  Consults: Gastroenterologist  Family Communication: None at bedside  Severity of Illness: The appropriate patient status for this patient is OBSERVATION. Observation status is judged to be reasonable and necessary in order to provide the required intensity of service to ensure the patient's safety. The patient's presenting symptoms, physical exam findings, and initial radiographic and laboratory data in the context of their medical condition is felt to place them at decreased risk for further clinical deterioration. Furthermore, it is anticipated that the patient will be medically stable for discharge from the hospital within 2 midnights of admission.   Author: Drue ONEIDA Potter, MD 06/24/2024 10:24 PM  For on call review  [email or web address].

## 2024-06-25 ENCOUNTER — Encounter
Admission: EM | Disposition: A | Discharge status: Home / Self Care | Payer: Self-pay | Source: Home / Self Care | Attending: Osteopathic Medicine

## 2024-06-25 ENCOUNTER — Observation Stay: Admitting: Anesthesiology

## 2024-06-25 ENCOUNTER — Observation Stay

## 2024-06-25 DIAGNOSIS — Z9049 Acquired absence of other specified parts of digestive tract: Secondary | ICD-10-CM

## 2024-06-25 DIAGNOSIS — R7401 Elevation of levels of liver transaminase levels: Secondary | ICD-10-CM | POA: Diagnosis present

## 2024-06-25 DIAGNOSIS — Z8673 Personal history of transient ischemic attack (TIA), and cerebral infarction without residual deficits: Secondary | ICD-10-CM | POA: Diagnosis not present

## 2024-06-25 DIAGNOSIS — Z79899 Other long term (current) drug therapy: Secondary | ICD-10-CM | POA: Diagnosis not present

## 2024-06-25 DIAGNOSIS — K5730 Diverticulosis of large intestine without perforation or abscess without bleeding: Secondary | ICD-10-CM | POA: Diagnosis present

## 2024-06-25 DIAGNOSIS — E66811 Obesity, class 1: Secondary | ICD-10-CM | POA: Diagnosis present

## 2024-06-25 DIAGNOSIS — K8050 Calculus of bile duct without cholangitis or cholecystitis without obstruction: Secondary | ICD-10-CM | POA: Diagnosis present

## 2024-06-25 DIAGNOSIS — Z96643 Presence of artificial hip joint, bilateral: Secondary | ICD-10-CM | POA: Diagnosis present

## 2024-06-25 DIAGNOSIS — E1122 Type 2 diabetes mellitus with diabetic chronic kidney disease: Secondary | ICD-10-CM | POA: Diagnosis present

## 2024-06-25 DIAGNOSIS — Z8249 Family history of ischemic heart disease and other diseases of the circulatory system: Secondary | ICD-10-CM | POA: Diagnosis not present

## 2024-06-25 DIAGNOSIS — Z794 Long term (current) use of insulin: Secondary | ICD-10-CM | POA: Diagnosis not present

## 2024-06-25 DIAGNOSIS — Z888 Allergy status to other drugs, medicaments and biological substances status: Secondary | ICD-10-CM | POA: Diagnosis not present

## 2024-06-25 DIAGNOSIS — Z87891 Personal history of nicotine dependence: Secondary | ICD-10-CM | POA: Diagnosis not present

## 2024-06-25 DIAGNOSIS — Z7985 Long-term (current) use of injectable non-insulin antidiabetic drugs: Secondary | ICD-10-CM | POA: Diagnosis not present

## 2024-06-25 DIAGNOSIS — Z886 Allergy status to analgesic agent status: Secondary | ICD-10-CM | POA: Diagnosis not present

## 2024-06-25 DIAGNOSIS — K838 Other specified diseases of biliary tract: Secondary | ICD-10-CM

## 2024-06-25 DIAGNOSIS — N179 Acute kidney failure, unspecified: Secondary | ICD-10-CM | POA: Diagnosis present

## 2024-06-25 DIAGNOSIS — I10 Essential (primary) hypertension: Secondary | ICD-10-CM | POA: Diagnosis present

## 2024-06-25 DIAGNOSIS — Z7984 Long term (current) use of oral hypoglycemic drugs: Secondary | ICD-10-CM | POA: Diagnosis not present

## 2024-06-25 DIAGNOSIS — E785 Hyperlipidemia, unspecified: Secondary | ICD-10-CM | POA: Diagnosis present

## 2024-06-25 DIAGNOSIS — B179 Acute viral hepatitis, unspecified: Secondary | ICD-10-CM | POA: Diagnosis present

## 2024-06-25 DIAGNOSIS — K5710 Diverticulosis of small intestine without perforation or abscess without bleeding: Secondary | ICD-10-CM | POA: Diagnosis not present

## 2024-06-25 DIAGNOSIS — E1165 Type 2 diabetes mellitus with hyperglycemia: Secondary | ICD-10-CM | POA: Diagnosis present

## 2024-06-25 DIAGNOSIS — Z6830 Body mass index (BMI) 30.0-30.9, adult: Secondary | ICD-10-CM | POA: Diagnosis not present

## 2024-06-25 HISTORY — PX: ERCP: SHX5425

## 2024-06-25 HISTORY — PX: STONE EXTRACTION WITH BASKET: SHX5318

## 2024-06-25 HISTORY — PX: BILIARY STENT PLACEMENT: SHX5538

## 2024-06-25 LAB — CBC
HCT: 35.5 % — ABNORMAL LOW (ref 36.0–46.0)
HCT: 34.2 % — ABNORMAL LOW (ref 36.0–46.0)
Hemoglobin: 11.8 g/dL — ABNORMAL LOW (ref 12.0–15.0)
Hemoglobin: 11.4 g/dL — ABNORMAL LOW (ref 12.0–15.0)
MCH: 29.7 pg (ref 26.0–34.0)
MCH: 30 pg (ref 26.0–34.0)
MCHC: 33.2 g/dL (ref 30.0–36.0)
MCHC: 33.3 g/dL (ref 30.0–36.0)
MCV: 89.4 fL (ref 80.0–100.0)
MCV: 90 fL (ref 80.0–100.0)
Platelets: 210 K/uL (ref 150–400)
Platelets: 197 K/uL (ref 150–400)
RBC: 3.97 MIL/uL (ref 3.87–5.11)
RBC: 3.8 MIL/uL — ABNORMAL LOW (ref 3.87–5.11)
RDW: 13.9 % (ref 11.5–15.5)
RDW: 14 % (ref 11.5–15.5)
WBC: 13.9 K/uL — ABNORMAL HIGH (ref 4.0–10.5)
WBC: 11.9 K/uL — ABNORMAL HIGH (ref 4.0–10.5)
nRBC: 0 % (ref 0.0–0.2)
nRBC: 0 % (ref 0.0–0.2)

## 2024-06-25 LAB — BASIC METABOLIC PANEL WITH GFR
Anion gap: 7 (ref 5–15)
BUN: 29 mg/dL — ABNORMAL HIGH (ref 8–23)
CO2: 24 mmol/L (ref 22–32)
Calcium: 8.7 mg/dL — ABNORMAL LOW (ref 8.9–10.3)
Chloride: 109 mmol/L (ref 98–111)
Creatinine, Ser: 1.36 mg/dL — ABNORMAL HIGH (ref 0.44–1.00)
GFR, Estimated: 40 mL/min — ABNORMAL LOW (ref >60)
Glucose, Bld: 101 mg/dL — ABNORMAL HIGH (ref 70–99)
Potassium: 3.7 mmol/L (ref 3.5–5.1)
Sodium: 140 mmol/L (ref 135–145)

## 2024-06-25 LAB — CREATININE, SERUM
Creatinine, Ser: 1.38 mg/dL — ABNORMAL HIGH (ref 0.44–1.00)
GFR, Estimated: 39 mL/min — ABNORMAL LOW (ref >60)

## 2024-06-25 LAB — HEMOGLOBIN A1C
Hgb A1c MFr Bld: 7.2 % — ABNORMAL HIGH (ref 4.8–5.6)
Hgb A1c MFr Bld: 7.5 % — ABNORMAL HIGH (ref 4.8–5.6)
Mean Plasma Glucose: 159.94 mg/dL
Mean Plasma Glucose: 168.55 mg/dL

## 2024-06-25 LAB — HEPATIC FUNCTION PANEL
ALT: 522 U/L — ABNORMAL HIGH (ref 0–44)
AST: 512 U/L — ABNORMAL HIGH (ref 15–41)
Albumin: 3 g/dL — ABNORMAL LOW (ref 3.5–5.0)
Alkaline Phosphatase: 276 U/L — ABNORMAL HIGH (ref 38–126)
Bilirubin, Direct: 2.7 mg/dL — ABNORMAL HIGH (ref 0.0–0.2)
Indirect Bilirubin: 1.6 mg/dL — ABNORMAL HIGH (ref 0.3–0.9)
Total Bilirubin: 4.3 mg/dL — ABNORMAL HIGH (ref 0.0–1.2)
Total Protein: 7.1 g/dL (ref 6.5–8.1)

## 2024-06-25 LAB — GLUCOSE, CAPILLARY
Glucose-Capillary: 106 mg/dL — ABNORMAL HIGH (ref 70–99)
Glucose-Capillary: 166 mg/dL — ABNORMAL HIGH (ref 70–99)
Glucose-Capillary: 167 mg/dL — ABNORMAL HIGH (ref 70–99)
Glucose-Capillary: 83 mg/dL (ref 70–99)

## 2024-06-25 LAB — HEPATITIS C ANTIBODY: HCV Ab: NONREACTIVE

## 2024-06-25 LAB — HEPATITIS PANEL, ACUTE
HCV Ab: NONREACTIVE
Hep A IgM: NONREACTIVE
Hep B C IgM: NONREACTIVE
Hepatitis B Surface Ag: NONREACTIVE

## 2024-06-25 LAB — HEPATITIS A ANTIBODY, IGM: Hep A IgM: NONREACTIVE

## 2024-06-25 LAB — HEPATITIS B SURFACE ANTIGEN: Hepatitis B Surface Ag: NONREACTIVE

## 2024-06-25 SURGERY — ERCP, WITH INTERVENTION IF INDICATED
Anesthesia: General

## 2024-06-25 MED ORDER — FENTANYL CITRATE (PF) 100 MCG/2ML IJ SOLN
INTRAMUSCULAR | Status: DC | PRN
  Administered 2024-06-25: 50 ug via INTRAVENOUS
  Administered 2024-06-25: 25 ug via INTRAVENOUS

## 2024-06-25 MED ORDER — DICLOFENAC SUPPOSITORY 100 MG
100.0000 mg | Freq: Once | RECTAL | Status: DC
  Administered 2024-06-25: 100 mg via RECTAL

## 2024-06-25 MED ORDER — LIDOCAINE HCL (CARDIAC) PF 100 MG/5ML IV SOSY
PREFILLED_SYRINGE | INTRAVENOUS | Status: DC | PRN
  Administered 2024-06-25: 60 mg via INTRAVENOUS

## 2024-06-25 MED ORDER — LIDOCAINE HCL (PF) 2 % IJ SOLN
INTRAMUSCULAR | Status: AC
  Filled 2024-06-25: qty 5

## 2024-06-25 MED ORDER — SODIUM CHLORIDE 0.9 % IV SOLN: INTRAVENOUS | Status: DC

## 2024-06-25 MED ORDER — EPHEDRINE SULFATE-NACL 50-0.9 MG/10ML-% IV SOSY
PREFILLED_SYRINGE | INTRAVENOUS | Status: DC | PRN
  Administered 2024-06-25 (×2): 5 mg via INTRAVENOUS

## 2024-06-25 MED ORDER — FENTANYL CITRATE (PF) 100 MCG/2ML IJ SOLN
INTRAMUSCULAR | Status: AC
  Filled 2024-06-25: qty 2

## 2024-06-25 MED ORDER — DEXMEDETOMIDINE HCL IN NACL 80 MCG/20ML IV SOLN
INTRAVENOUS | Status: DC | PRN
  Administered 2024-06-25 (×2): 4 ug via INTRAVENOUS

## 2024-06-25 MED ORDER — PHENYLEPHRINE 80 MCG/ML (10ML) SYRINGE FOR IV PUSH (FOR BLOOD PRESSURE SUPPORT)
PREFILLED_SYRINGE | INTRAVENOUS | Status: DC | PRN
  Administered 2024-06-25 (×2): 80 ug via INTRAVENOUS

## 2024-06-25 MED ORDER — PROPOFOL 500 MG/50ML IV EMUL
INTRAVENOUS | Status: DC | PRN
  Administered 2024-06-25: 100 ug/kg/min via INTRAVENOUS
  Administered 2024-06-25: 10 mg via INTRAVENOUS
  Administered 2024-06-25: 20 mg via INTRAVENOUS

## 2024-06-25 MED ORDER — DEXMEDETOMIDINE HCL IN NACL 80 MCG/20ML IV SOLN
INTRAVENOUS | Status: AC
  Filled 2024-06-25: qty 20

## 2024-06-25 MED ORDER — DICLOFENAC SUPPOSITORY 100 MG
RECTAL | Status: AC
  Filled 2024-06-25: qty 1

## 2024-06-25 MED ORDER — LACTATED RINGERS IV SOLN: INTRAVENOUS | Status: DC

## 2024-06-25 MED ORDER — PROPOFOL 10 MG/ML IV BOLUS
INTRAVENOUS | Status: AC
  Filled 2024-06-25: qty 40

## 2024-06-25 MED ORDER — ONDANSETRON HCL 4 MG/2ML IJ SOLN
INTRAMUSCULAR | Status: DC | PRN
  Administered 2024-06-25: 4 mg via INTRAVENOUS

## 2024-06-25 NOTE — Care Management Obs Status (Signed)
 MEDICARE OBSERVATION STATUS NOTIFICATION   Patient Details  Name: [name] [name] MRN: [id number] Date of Birth: 07/27/1946   Medicare Observation Status Notification Given:  No patient admitted    Rojelio SHAUNNA Rattler 06/25/2024, 1:29 PM

## 2024-06-25 NOTE — Hospital Course (Addendum)
 Hospital course / significant events:   HPI:  [name] [name] is a 78 y.o. female with medical history significant of hypertension, hyperlipidemia, diabetes, previous stroke with no residual weakness who was otherwise well until this morning when she started experiencing nausea and vomiting with associated abdominal pain.  According to patient she vomited about 5 times with abdominal pain intensity of 10/10 not localized.  Pain relieved on its own.  At the time patient was being seen she denied cough chest pain urinary complaint or diarrhea.  She does not abuse alcohol and the last alcohol use according to her was 10 years ago.   10/02: to ED. Significant for elevated liver enzymes. CT (+)marked common bile duct dilation with choledocholithiasis --> ERCP being planned w/ GI 10/03: ERCP today - (+)choledocholithiasis, removal partially accomplishes, stent placed into CBD, return for repeat ERCP 2 weeks  10/04: tolerating reg diet. LFT and WBC normalizing. TOC to contact her facility to ensure ok to return likely tomorrow if continuing to do well post-procedure      Consultants:  Gastroenterology  Procedures/Surgeries: 06/25/24 ERCP      ASSESSMENT & PLAN:   Acute transaminitis in the setting of choledocholithiasis marked common bile duct dilation (3.1 cm) with choledocholithiasis and mild intrahepatic biliary ductal dilation. AST 1609, ALT 851, alkaline phosphatase 367 lipase 65 ERCP today --> (+)choledocholithiasis, removal partially accomplished, stent placed into CBD, return for repeat ERCP 2 weeks  Pain control Advancing diet and tolerating well    AKI - improving  Creatinine around 1.1 a year ago however presents with creatinine of 1.4 supplemental IV fluid Monitor renal function closely   Essential hypertension hold lisinopril  in the setting of AKI   Hyperlipidemia Holding statin therapy in the setting of transaminitis   Diabetes mellitus type 2 with hyperglycemia A1C  7.2, at goal for age  Presented with glucose 216 On non-insulin  therapy w/ glipizide , jardiance , metformin, ozempic at home Also on Tresiba 40 units daily Initiated on sliding scale   History of stroke with no residual weakness Holding statin therapy in the setting of transaminitis      Class 1 obesity based on BMI: Body mass index is 30.93 kg/m.SABRA Significantly low or high BMI is associated with higher medical risk.  Underweight - under 18  overweight - 25 to 29 obese - 30 or more Class 1 obesity: BMI of 30.0 to 34 Class 2 obesity: BMI of 35.0 to 39 Class 3 obesity: BMI of 40.0 to 49 Super Morbid Obesity: BMI 50-59 Super-super Morbid Obesity: BMI 60+ Healthy nutrition and physical activity advised as adjunct to other disease management and risk reduction treatments    DVT prophylaxis: heparin IV fluids: can dc continuous IV fluids if toelrating po  Nutrition: CLD Central lines / other devices: none  Code Status: FULL CODE ACP documentation reviewed:  none on file in [facility]  TOC needs: return to previous living environment  Medical barriers to dispo: if tolerating po and cleared by GI, expected medical readiness for discharge tomorrow.

## 2024-06-25 NOTE — Care Management Obs Status (Signed)
 MEDICARE OBSERVATION STATUS NOTIFICATION   Patient Details  Name: Ashelynn C Wnuk MRN: [id number] Date of Birth: 1946-03-18   Medicare Observation Status Notification Given:  patient admitted     Rojelio SHAUNNA Rattler 06/25/2024, 1:28 PM

## 2024-06-25 NOTE — Progress Notes (Signed)
 PROGRESS NOTE    [name] [name]   FMW:[id number] DOB: 02-17-1946  DOA: 06/24/2024 Date of Service: 06/25/24 which is hospital day 0  PCP: Sampson Ethridge LABOR, MD    Hospital course / significant events:   HPI:  [name] [name] is a 78 y.o. female with medical history significant of hypertension, hyperlipidemia, diabetes, previous stroke with no residual weakness who was otherwise well until this morning when she started experiencing nausea and vomiting with associated abdominal pain.  According to patient she vomited about 5 times with abdominal pain intensity of 10/10 not localized.  Pain relieved on its own.  At the time patient was being seen she denied cough chest pain urinary complaint or diarrhea.  She does not abuse alcohol and the last alcohol use according to her was 10 years ago.   10/02: to ED. Significant for elevated liver enzymes. CT (+)marked common bile duct dilation with choledocholithiasis --> ERCP being planned w/ GI 10/03: ERCP today - (+)choledocholithiasis, removal partially accomplishes, stent placed into CBD, return for repeat ERCP 2 weeks      Consultants:  Gastroenterology  Procedures/Surgeries: 06/25/24 ERCP      ASSESSMENT & PLAN:   Acute transaminitis in the setting of choledocholithiasis marked common bile duct dilation (3.1 cm) with choledocholithiasis and mild intrahepatic biliary ductal dilation. AST 1609, ALT 851, alkaline phosphatase 367 lipase 65 ERCP today --> (+)choledocholithiasis, removal partially accomplishes, stent placed into CBD, return for repeat ERCP 2 weeks  Pain control NPO --> CLD   AKI Creatinine around 1.1 a year ago however presents with creatinine of 1.4 supplemental IV fluid Monitor renal function closely   Essential hypertension hold lisinopril  in the setting of AKI Placed on amlodipine 5 mg daily   Hyperlipidemia Holding statin therapy in the setting of transaminitis   Diabetes mellitus type 2 with  hyperglycemia A1C 7.2, at goal for age  Presented with glucose 216 On non-insulin  therapy w/ glipizide , jardiance , metformin, ozempic at home Also on Tresiba 40 units daily Initiated on sliding scale   History of stroke with no residual weakness Holding statin therapy in the setting of transaminitis      Class 1 obesity based on BMI: Body mass index is 30.93 kg/m.SABRA Significantly low or high BMI is associated with higher medical risk.  Underweight - under 18  overweight - 25 to 29 obese - 30 or more Class 1 obesity: BMI of 30.0 to 34 Class 2 obesity: BMI of 35.0 to 39 Class 3 obesity: BMI of 40.0 to 49 Super Morbid Obesity: BMI 50-59 Super-super Morbid Obesity: BMI 60+ Healthy nutrition and physical activity advised as adjunct to other disease management and risk reduction treatments    DVT prophylaxis: heparin IV fluids: can dc continuous IV fluids if toelrating po  Nutrition: CLD Central lines / other devices: none  Code Status: FULL CODE ACP documentation reviewed:  none on file in [facility]  TOC needs: TBD Medical barriers to dispo: if tolerating po expected medical readiness for discharge tomorrow.              Subjective / Brief ROS:  Patient reports feeling okay now, abd pain is improved  Denies CP/SOB.  Pain controlled.  Reports no concerns w/ urination/defecation.   Family Communication: none at this time,     Objective Findings:  Vitals:   06/25/24 1444 06/25/24 1454 06/25/24 1504 06/25/24 1534  BP: 111/64   114/69  Pulse:  75 76 75  Resp: 20   20  Temp: 98.6 F (37 C)   (!) 97.5 F (36.4 C)  TempSrc: Temporal   Oral  SpO2:  100% 100% 100%  Weight:      Height:        Intake/Output Summary (Last 24 hours) at 06/25/2024 1645 Last data filed at 06/25/2024 0507 Gross per 24 hour  Intake 497.57 ml  Output --  Net 497.57 ml   Filed Weights   06/25/24 0104  Weight: 76.7 kg    Examination:  Physical Exam Cardiovascular:     Rate  and Rhythm: Normal rate and regular rhythm.  Pulmonary:     Effort: Pulmonary effort is normal.  Abdominal:     General: Bowel sounds are normal.     Palpations: Abdomen is soft.  Skin:    General: Skin is warm.  Neurological:     General: No focal deficit present.     Mental Status: She is alert and oriented to person, place, and time.  Psychiatric:        Mood and Affect: Mood normal.          Scheduled Medications:   amLODipine  5 mg Oral Daily   heparin  5,000 Units Subcutaneous Q8H   insulin  aspart  0-5 Units Subcutaneous QHS   insulin  aspart  0-9 Units Subcutaneous TID WC    Continuous Infusions:  sodium chloride  100 mL/hr at 06/25/24 1331    PRN Medications:  morphine  injection  Antimicrobials from admission:  Anti-infectives (From admission, onward)    None           Data Reviewed:  I have personally reviewed the following...  CBC: Recent Labs  Lab 06/24/24 0147 06/24/24 1737 06/25/24 0531  WBC 13.9* 12.7* 11.9*  HGB 11.8* 13.2 11.4*  HCT 35.5* 40.3 34.2*  MCV 89.4 90.2 90.0  PLT 210 218 197   Basic Metabolic Panel: Recent Labs  Lab 06/24/24 0147 06/24/24 1737 06/25/24 0531  NA  --  138 140  K  --  4.0 3.7  CL  --  104 109  CO2  --  22 24  GLUCOSE  --  216* 101*  BUN  --  29* 29*  CREATININE 1.38* 1.41* 1.36*  CALCIUM   --  9.7 8.7*   GFR: Estimated Creatinine Clearance: 33.2 mL/min (A) (by C-G formula based on SCr of 1.36 mg/dL (H)). Liver Function Tests: Recent Labs  Lab 06/24/24 1737 06/25/24 0905  AST 1,609* 512*  ALT 851* 522*  ALKPHOS 367* 276*  BILITOT 3.0* 4.3*  PROT 9.4* 7.1  ALBUMIN 4.0 3.0*   Recent Labs  Lab 06/24/24 1737  LIPASE 65*   No results for input(s): AMMONIA in the last 168 hours. Coagulation Profile: Recent Labs  Lab 06/24/24 2117  INR 1.1   Cardiac Enzymes: No results for input(s): CKTOTAL, CKMB, CKMBINDEX, TROPONINI in the last 168 hours. BNP (last 3 results) No results  for input(s): PROBNP in the last 8760 hours. HbA1C: Recent Labs    06/25/24 0149 06/25/24 0531  HGBA1C 7.2* 7.5*   CBG: Recent Labs  Lab 06/25/24 0058 06/25/24 1318  GLUCAP 166* 83   Lipid Profile: No results for input(s): CHOL, HDL, LDLCALC, TRIG, CHOLHDL, LDLDIRECT in the last 72 hours. Thyroid Function Tests: No results for input(s): TSH, T4TOTAL, FREET4, T3FREE, THYROIDAB in the last 72 hours. Anemia Panel: No results for input(s): VITAMINB12, FOLATE, FERRITIN, TIBC, IRON, RETICCTPCT in the last 72 hours. Most Recent Urinalysis On File:     Component Value Date/Time  COLORURINE AMBER (A) 06/24/2024 1957   APPEARANCEUR CLEAR (A) 06/24/2024 1957   APPEARANCEUR Hazy 03/01/2013 0932   LABSPEC 1.024 06/24/2024 1957   LABSPEC 1.019 03/01/2013 0932   PHURINE 5.0 06/24/2024 1957   GLUCOSEU >=500 (A) 06/24/2024 1957   GLUCOSEU 50 mg/dL 93/90/7985 9067   HGBUR NEGATIVE 06/24/2024 1957   BILIRUBINUR NEGATIVE 06/24/2024 1957   BILIRUBINUR Negative 03/01/2013 0932   KETONESUR NEGATIVE 06/24/2024 1957   PROTEINUR NEGATIVE 06/24/2024 1957   NITRITE NEGATIVE 06/24/2024 1957   LEUKOCYTESUR NEGATIVE 06/24/2024 1957   LEUKOCYTESUR Negative 03/01/2013 0932   Sepsis Labs: @LABRCNTIP (procalcitonin:4,lacticidven:4) Microbiology: No results found for this or any previous visit (from the past 240 hours).    Radiology Studies last 3 days: DG C-Arm 1-60 Min-No Report Result Date: 06/25/2024 Fluoroscopy was utilized by the requesting physician.  No radiographic interpretation.   CT ABDOMEN PELVIS WO CONTRAST Result Date: 06/24/2024 EXAM: CT ABDOMEN AND PELVIS WITHOUT CONTRAST 06/24/2024 10:50:44 PM TECHNIQUE: CT of the abdomen and pelvis was performed without the administration of intravenous contrast. Multiplanar reformatted images are provided for review. Automated exposure control, iterative reconstruction, and/or weight-based adjustment of the  mA/kV was utilized to reduce the radiation dose to as low as reasonably achievable. COMPARISON: 01/28/2023 CLINICAL HISTORY: Abdominal pain, acute, nonlocalized. Patient states abdominal pain and vomiting since this morning; denies known sick contacts. FINDINGS: LOWER CHEST: No acute abnormality. LIVER: The liver is unremarkable. GALLBLADDER AND BILE DUCTS: Prior cholecystectomy. There is marked dilation of the common bile duct measuring 3.1 cm, with Choledocholithiasis noted with largest calculus measuring 12 mm. Mild intrahepatic biliary ductal dilation. SPLEEN: No acute abnormality. PANCREAS: No acute abnormality. ADRENAL GLANDS: No acute abnormality. KIDNEYS, URETERS AND BLADDER: No stones in the kidneys or ureters. No hydronephrosis. No perinephric or periureteral stranding. Assessment of the bladder is limited by streak artifact from bilateral hip arthroplasties. GI AND BOWEL: Stomach demonstrates no acute abnormality. Chronic diverticulosis with no evidence of diverticulitis. No bowel wall thickening or inflammatory change. There is no bowel obstruction. PERITONEUM AND RETROPERITONEUM: No ascites. No free air. VASCULATURE: Atherosclerosis of the aorta and coronary vasculature. Aorta is normal in caliber. LYMPH NODES: No lymphadenopathy. REPRODUCTIVE ORGANS: No acute abnormality. BONES AND SOFT TISSUES: Unremarkable bilateral hip arthroplasties. No acute osseous abnormality. No focal soft tissue abnormality. IMPRESSION: 1. Marked common bile duct dilation (3.1 cm) with choledocholithiasis (largest 12 mm) and mild intrahepatic biliary ductal dilation. 2. Colonic diverticulosis without diverticulitis. Electronically signed by: Ozell Daring MD 06/24/2024 10:55 PM EDT RP Workstation: [id number]          Laneta Blunt, DO [facility] 06/25/2024, 4:45 PM    Dictation software may have been used to generate the above note. Typos may occur and escape review in typed/dictated notes. Please  contact Dr Blunt directly for clarity if needed.  Staff may message me via secure chat in Epic  but this may not receive an immediate response,  please page me for urgent matters!  If 7PM-7AM, please contact night coverage [email or web address]

## 2024-06-25 NOTE — Op Note (Signed)
 [facility] Gastroenterology Patient Name: [name] [name] Procedure Date: 06/25/2024 12:42 PM MRN: [id number] Account #: [id number] Date of Birth: 05-25-1946 Admit Type: Inpatient Age: 78 Room: [facility] ENDO ROOM 4 Gender: Female Note Status: Finalized Instrument Name: CELINDA [id number] Procedure:             ERCP Indications:           Bile duct stone(s), Jaundice Providers:             [name] MD, MD Medicines:             Propofol  per Anesthesia Complications:         No immediate complications. Procedure:             Pre-Anesthesia Assessment:                        - Prior to the procedure, a History and Physical was                         performed, and patient medications and allergies were                         reviewed. The patient's tolerance of previous                         anesthesia was also reviewed. The risks and benefits                         of the procedure and the sedation options and risks                         were discussed with the patient. All questions were                         answered, and informed consent was obtained. Prior                         Anticoagulants: The patient has taken no anticoagulant                         or antiplatelet agents. ASA Grade Assessment: II - A                         patient with mild systemic disease. After reviewing                         the risks and benefits, the patient was deemed in                         satisfactory condition to undergo the procedure.                        After obtaining informed consent, the scope was passed                         under direct vision. Throughout the procedure, the                         patient's blood pressure,  pulse, and oxygen                         saturations were monitored continuously. The                         Duodenoscope was introduced through the mouth, and                         used to inject contrast into and used to inject                          contrast into the bile duct. The ERCP was accomplished                         without difficulty. The patient tolerated the                         procedure well. Findings:      A scout film of the abdomen was obtained. Surgical clips, consistent       with a previous cholecystectomy, were seen in the area of the right       upper quadrant of the abdomen. The major papilla was on the rim of a       diverticulum. A wire was passed into the biliary tree. A 10 mm biliary       sphincterotomy was made with a monofilament traction (standard)       sphincterotome using ERBE electrocautery. The sphincterotomy oozed       blood. The bile duct was deeply cannulated with the short-nosed traction       sphincterotome. Contrast was injected. I personally interpreted the bile       duct images. There was brisk flow of contrast through the ducts. Image       quality was excellent. Contrast extended to the entire biliary tree. The       main bile duct was markedly dilated. The largest diameter was over 25       mm. The biliary tree was swept with a 15 mm balloon starting at the       bifurcation. Many stones were removed. A few stones remained. One 10 Fr       by 9 cm stent with a single external flap and a single internal flap was       placed into the common bile duct. The stent was in good position. One 10       Fr by 7 cm stent with a single external flap and a single internal flap       was placed into the common bile duct. The stent was in good position. Impression:            - The major papilla was on the rim of a diverticulum.                        - The entire main bile duct was markedly dilated.                        - Choledocholithiasis was found. Partial removal was  accomplished with biliary sphincterotomy; a stent was                         inserted.                        - A biliary sphincterotomy was performed.                         - The biliary tree was swept.                        - One stent was placed into the common bile duct.                        - One stent was placed into the common bile duct. Recommendation:        - Clear liquid diet today.                        - Avoid aspirin  and nonsteroidal anti-inflammatory                         medicines.                        - Return to endoscopist for stent removal at ERCP in 2                         months. Procedure Code(s):     --- Professional ---                        [phone], Endoscopic retrograde cholangiopancreatography                         (ERCP); with placement of endoscopic stent into                         biliary or pancreatic duct, including pre- and                         post-dilation and guide wire passage, when performed,                         including sphincterotomy, when performed, each stent                        43274, 59, Endoscopic retrograde                         cholangiopancreatography (ERCP); with placement of                         endoscopic stent into biliary or pancreatic duct,                         including pre- and post-dilation and guide wire                         passage, when performed, including sphincterotomy,  when performed, each stent                        43264, Endoscopic retrograde cholangiopancreatography                         (ERCP); with removal of calculi/debris from                         biliary/pancreatic duct(s)                        [phone], Endoscopic catheterization of the biliary                         ductal system, radiological supervision and                         interpretation Diagnosis Code(s):     --- Professional ---                        K80.50, Calculus of bile duct without cholangitis or                         cholecystitis without obstruction                        R17, Unspecified jaundice CPT copyright 2022 American Medical Association.  All rights reserved. The codes documented in this report are preliminary and upon coder review may  be revised to meet current compliance requirements. [name] MD, MD 06/25/2024 2:39:21 PM This report has been signed electronically. Number of Addenda: 0 Note Initiated On: 06/25/2024 12:42 PM Estimated Blood Loss:  Estimated blood loss: none.      [facility]

## 2024-06-25 NOTE — Anesthesia Postprocedure Evaluation (Signed)
 Anesthesia Post Note  Patient: Alauna C Stonehouse  Procedure(s) Performed: ERCP, WITH INTERVENTION IF INDICATED INSERTION, STENT, BILE DUCT  Patient location during evaluation: PACU Anesthesia Type: General Level of consciousness: awake Pain management: satisfactory to patient Vital Signs Assessment: post-procedure vital signs reviewed and stable Respiratory status: nonlabored ventilation Cardiovascular status: stable Anesthetic complications: no   No notable events documented.   Last Vitals:  Vitals:   06/25/24 1316 06/25/24 1444  BP: (!) 148/64 111/64  Pulse: 79   Resp: 16 20  Temp: (!) 35.8 C 37 C  SpO2: 100%     Last Pain:  Vitals:   06/25/24 1444  TempSrc: Temporal  PainSc: 0-No pain                 VAN STAVEREN,Amilyah Nack

## 2024-06-25 NOTE — Anesthesia Preprocedure Evaluation (Addendum)
 Anesthesia Evaluation  Patient identified by MRN, date of birth, ID band Patient awake    Reviewed: Allergy & Precautions, NPO status , Patient's Chart, lab work & pertinent test results  Airway Mallampati: II  TM Distance: >3 FB Neck ROM: Full    Dental  (+) Upper Dentures, Lower Dentures   Pulmonary Patient abstained from smoking., former smoker   Pulmonary exam normal breath sounds clear to auscultation       Cardiovascular Exercise Tolerance: Good hypertension, Pt. on medications Normal cardiovascular exam Rhythm:Regular Rate:Normal     Neuro/Psych CVA, No Residual Symptoms  negative psych ROS   GI/Hepatic negative GI ROS, Neg liver ROS,,,  Endo/Other  diabetes, Type 2, Oral Hypoglycemic Agents    Renal/GU Renal InsufficiencyRenal disease  negative genitourinary   Musculoskeletal  (+) Arthritis ,    Abdominal Normal abdominal exam  (+)   Peds negative pediatric ROS (+)  Hematology negative hematology ROS (+)   Anesthesia Other Findings Past Medical History: No date: Arthritis No date: Diabetes mellitus without complication (HCC) No date: Hypertension No date: Stroke [facility])     Comment:  light stoke No residual deficits  Past Surgical History: No date: CHOLECYSTECTOMY No date: HIP SURGERY No date: JOINT REPLACEMENT     Comment:  left hip 05/05/2017: TOTAL HIP ARTHROPLASTY; Right     Comment:  Procedure: TOTAL HIP ARTHROPLASTY ANTERIOR APPROACH;                Surgeon: Leora Lynwood SAUNDERS, MD;  Location: [facility] ORS;                Service: Orthopedics;  Laterality: Right;  BMI    Body Mass Index: 30.93 kg/m      Reproductive/Obstetrics negative OB ROS                              Anesthesia Physical Anesthesia Plan  ASA: 3  Anesthesia Plan: General   Post-op Pain Management:    Induction: Intravenous  PONV Risk Score and Plan: Propofol  infusion and TIVA  Airway  Management Planned: Natural Airway and Nasal Cannula  Additional Equipment:   Intra-op Plan:   Post-operative Plan:   Informed Consent: I have reviewed the patients History and Physical, chart, labs and discussed the procedure including the risks, benefits and alternatives for the proposed anesthesia with the patient or authorized representative who has indicated his/her understanding and acceptance.     Dental Advisory Given  Plan Discussed with: CRNA  Anesthesia Plan Comments:          Anesthesia Quick Evaluation

## 2024-06-25 NOTE — TOC Initial Note (Signed)
 Transition of Care [facility]) - Initial/Assessment Note    Patient Details  Name: [name] [name] MRN: [id number] Date of Birth: [birth year]  [facility] CM/SW Contact:    Alfonso Rummer, LCSW Phone Number: 06/25/2024, 4:36 PM  Clinical Narrative:                  KEN DELENA Rummer completed TOC chart review. No TOC needs identified please contact should needs arise        Patient Goals and CMS Choice            Expected Discharge Plan and Services                                              Prior Living Arrangements/Services                       Activities of Daily Living   ADL Screening (condition at time of admission) Independently performs ADLs?: Yes (appropriate for developmental age) Is the patient deaf or have difficulty hearing?: No Does the patient have difficulty seeing, even when wearing glasses/contacts?: No Does the patient have difficulty concentrating, remembering, or making decisions?: No  Permission Sought/Granted                  Emotional Assessment              Admission diagnosis:  Acute hepatitis [B17.9] Hepatitis [K75.9] Patient Active Problem List   Diagnosis Date Noted   Choledocholithiasis 06/25/2024   Acute hepatitis 01/28/2023   Acute kidney injury superimposed on chronic kidney disease 01/28/2023   Uncontrolled type 2 diabetes mellitus with hyperglycemia, with long-term current use of insulin  (HCC) 01/28/2023   Dyslipidemia 01/28/2023   Essential hypertension 01/28/2023   Pain due to onychomycosis of toenails of both feet 01/31/2022   Vitamin D  insufficiency 08/18/2018   Chronic pain disorder 07/27/2018   Pharmacologic therapy 07/27/2018   Disorder of skeletal system 07/27/2018   Problems influencing health status 07/27/2018   Lumbar facet syndrome 07/27/2018   Chronic low back pain (Secondary Area of Pain) (Bilateral) (R>L) w/o sciatica 07/27/2018   DDD (degenerative disc disease), lumbar  07/01/2018   Abnormal MRI, lumbar spine (06/27/2018) 07/01/2018   Chronic lower extremity pain (Primary Area of Pain) (Right) 07/01/2018   Lumbosacral radiculopathy at L5 (Right) 07/01/2018   Chronic lumbar radicular pain (L5 dermatome) (Right) 07/01/2018   Lumbar spondylosis 07/01/2018   Lumbar spondylosis with radiculopathy (Right) 07/01/2018   S/P total hip replacement (Right) (05/05/2018) 07/01/2018   Lumbar Grade 1 Anterolisthesis of L4 over L5 07/01/2018   Lumbar facet hypertrophy (Bilateral) 07/01/2018   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) (Bilateral: L3-4, L4-5) (Right: L5-S1) 07/01/2018   Lumbar lateral recess stenosis (Bilateral: L4-5) (Right: L5-S1) 07/01/2018   Lumbar facet arthropathy 07/01/2018   Chronic low back pain (Secondary Area of Pain) (Bilateral) w/ sciatica (Right) 12/15/2017   Hx of total hip arthroplasty (Right) 05/15/2017   Status post THR (total hip replacement) 05/05/2017   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) 03/03/2017   MGUS (monoclonal gammopathy of unknown significance) 11/22/2016   Diabetes mellitus type 2, uncomplicated (HCC) 11/08/2016   PCP:  Sampson Ethridge DELENA, MD Pharmacy:   [facility] [address] [place] - [address] [place] [address] Phone: [phone] Fax:  [phone]     Social Drivers of Health (SDOH) Social History: SDOH Screenings   Food Insecurity: No Food Insecurity (06/25/2024)  Housing: Low Risk  (06/25/2024)  Transportation Needs: No Transportation Needs (06/25/2024)  Utilities: Not At Risk (06/25/2024)  Depression (PHQ2-9): Low Risk  (07/19/2019)  Tobacco Use: Medium Risk (06/24/2024)   SDOH Interventions:     Readmission Risk Interventions     No data to display

## 2024-06-25 NOTE — Consult Note (Signed)
 [name] Copping, MD [facility]  [address] [place], [place] [address] Phone: [phone] Fax : [phone]  Consultation  Referring Provider:     Dr. Floy Primary Care Physician:  Sampson Ethridge LABOR, MD Primary Gastroenterologist:  Dr. Aundria         Reason for Consultation:     Abnormal enzymes  Date of Admission:  06/24/2024 Date of Consultation:  06/25/2024         HPI:   [name] [name] is a 78 y.o. female who was in the hospital with abdominal pain last year and evaluated by [facility].  At that time the patient was reported to have epigastric pain elevated liver enzymes and an abnormal MRCP.  The patient has a history of hypertension osteoarthritis chronic kidney disease with history of CVA with type 2 diabetes mellitus and TIAs.  The patient reports that she has been having abdominal pain off and on for 3 years but states that it was worse recently so she came to the emergency department.  MRCP at that time showed a 2.2 cm caliber of the common bile duct with smooth tapering of the ampulla without any obstructive stone seen.  A HIDA scan or ERCP was recommended for further evaluation.  The patient also was found to have descending colon and sigmoid diverticulosis.  The patient reports her pain to be in the lower abdomen. Due to the nausea and vomiting the patient was sent for gastric emptying study by her PCP which was reported to be normal.  The patient had repeat imaging yesterday with a CT scan showing:  IMPRESSION: 1. Marked common bile duct dilation (3.1 cm) with choledocholithiasis (largest 12 mm) and mild intrahepatic biliary ductal dilation. 2. Colonic diverticulosis without diverticulitis.  The patient is not having any pain at the present time.  She has had a acute hepatitis panel sent off and her liver enzymes yesterday showed markedly increased liver enzymes with a trend showing:  Component     Latest Ref Rng 01/29/2023 01/30/2023 01/31/2023 06/24/2024  AST      15 - 41 U/L 584 (H)  247 (H)  127 (H)  1,609 (H)   ALT     0 - 44 U/L 452 (H)  311 (H)  201 (H)  851 (H)   Alkaline Phosphatase     38 - 126 U/L 463 (H)  446 (H)  395 (H)  367 (H)   Total Bilirubin     0.0 - 1.2 mg/dL 3.7 (H)  3.8 (H)  2.0 (H)  3.0 (H)    Despite the chronic elevation in the liver enzymes dating back to at least 2024 there has been no GI follow-up.    Past Medical History:  Diagnosis Date   Arthritis    Diabetes mellitus without complication (HCC)    Hypertension    Stroke (HCC)    light stoke No residual deficits    Past Surgical History:  Procedure Laterality Date   CHOLECYSTECTOMY     HIP SURGERY     JOINT REPLACEMENT     left hip   TOTAL HIP ARTHROPLASTY Right 05/05/2017   Procedure: TOTAL HIP ARTHROPLASTY ANTERIOR APPROACH;  Surgeon: Leora Lynwood SAUNDERS, MD;  Location: [facility] ORS;  Service: Orthopedics;  Laterality: Right;    Prior to Admission medications   Medication Sig Start Date End Date Taking? Authorizing Provider  acarbose  (PRECOSE ) 100 MG tablet Take 100 mg by mouth 3 (three) times daily with  meals.     [provider]  acetaminophen  (TYLENOL ) 325 MG tablet Take 2 tablets (650 mg total) by mouth every 6 (six) hours as needed for mild pain (or Fever >/= 101). 05/07/17   Leora Lynwood SAUNDERS, MD  atorvastatin  (LIPITOR) 20 MG tablet Take 20 mg by mouth daily.     [provider]  Calcium  Carbonate-Vit D-Min (GNP CALCIUM  1200) 1200-1000 MG-UNIT CHEW Chew 1,200 mg by mouth daily with breakfast. Take in combination with vitamin D  and magnesium . 08/18/18 02/14/19  Tanya Glisson, MD  Continuous Glucose Sensor (FREESTYLE LIBRE 2 SENSOR) MISC Apply topically. 01/08/23   [provider]  glipiZIDE  (GLUCOTROL ) 10 MG tablet Take 10 mg by mouth 2 (two) times daily before a meal. 06/14/15   [provider]  JARDIANCE  25 MG TABS tablet Take 25 mg by mouth every morning.    [provider]  lisinopril  (ZESTRIL ) 20 MG tablet  Take 1 tablet (20 mg total) by mouth daily. 01/31/23 01/31/24  Von Bellis, MD  metFORMIN (GLUCOPHAGE-XR) 500 MG 24 hr tablet Take 500 mg by mouth daily.    [provider]  nystatin  (MYCOSTATIN /NYSTOP ) powder Apply 1 Application topically 2 (two) times daily. 01/21/23   [provider]  OZEMPIC, 0.25 OR 0.5 MG/DOSE, 2 MG/3ML SOPN Inject 0.25 mg into the skin once a week. 01/21/23   [provider]  TRESIBA FLEXTOUCH 200 UNIT/ML FlexTouch Pen Inject 40 Units into the skin daily. 01/09/23   [provider]  triamcinolone  cream (KENALOG ) 0.1 % Apply 1 Application topically 3 (three) times daily. 01/21/23   [provider]    Family History  Problem Relation Age of Onset   Heart disease Mother    Alcohol abuse Father    Breast cancer Neg Hx      Social History   Tobacco Use   Smoking status: Former    Current packs/day: 0.00    Types: Cigarettes    Quit date: 04/28/2012    Years since quitting: 12.1   Smokeless tobacco: Never  Vaping Use   Vaping status: Never Used  Substance Use Topics   Alcohol use: No   Drug use: No    Allergies as of 06/24/2024 - Review Complete 06/24/2024  Allergen Reaction Noted   Cortisone  07/09/2018   Gabapentin   05/05/2017   Nsaids  05/05/2017    Review of Systems:    All systems reviewed and negative except where noted in HPI.   Physical Exam:  Vital signs in last 24 hours: Temp:  [98.2 F (36.8 C)-98.6 F (37 C)] 98.2 F (36.8 C) (10/03 0831) Pulse Rate:  [74-105] 82 (10/03 0831) Resp:  [16-20] 17 (10/03 0831) BP: (98-131)/(58-74) 116/71 (10/03 0831) SpO2:  [96 %-100 %] 100 % (10/03 0831) Weight:  [76.7 kg] 76.7 kg (10/03 0104) Last BM Date : 06/24/24 General:   Pleasant, cooperative in NAD Head:  Normocephalic and atraumatic. Eyes:   No icterus.   Conjunctiva pink. PERRLA. Ears:  Normal auditory acuity. Neck:  Supple; no masses or thyroidomegaly Lungs: Respirations even and unlabored. Lungs  clear to auscultation bilaterally.   No wheezes, crackles, or rhonchi.  Heart:  Regular rate and rhythm;  Without murmur, clicks, rubs or gallops Abdomen:  Soft, nondistended, nontender. Normal bowel sounds. No appreciable masses or hepatomegaly.  No rebound or guarding.  Rectal:  Not performed. Msk:  Symmetrical without gross deformities.   Extremities:  Without edema, cyanosis or clubbing. Neurologic:  Alert and oriented x3;  grossly normal neurologically. Skin:  Intact without significant lesions or rashes. Cervical Nodes:  No significant cervical adenopathy. Psych:  Alert and cooperative. Normal affect.  LAB RESULTS: Recent Labs    06/24/24 0147 06/24/24 1737 06/25/24 0531  WBC 13.9* 12.7* 11.9*  HGB 11.8* 13.2 11.4*  HCT 35.5* 40.3 34.2*  PLT 210 218 197   BMET Recent Labs    06/24/24 0147 06/24/24 1737 06/25/24 0531  NA  --  138 140  K  --  4.0 3.7  CL  --  104 109  CO2  --  22 24  GLUCOSE  --  216* 101*  BUN  --  29* 29*  CREATININE 1.38* 1.41* 1.36*  CALCIUM   --  9.7 8.7*   LFT Recent Labs    06/24/24 1737  PROT 9.4*  ALBUMIN 4.0  AST 1,609*  ALT 851*  ALKPHOS 367*  BILITOT 3.0*   PT/INR Recent Labs    06/24/24 2117  LABPROT 14.8  INR 1.1    STUDIES: CT ABDOMEN PELVIS WO CONTRAST Result Date: 06/24/2024 EXAM: CT ABDOMEN AND PELVIS WITHOUT CONTRAST 06/24/2024 10:50:44 PM TECHNIQUE: CT of the abdomen and pelvis was performed without the administration of intravenous contrast. Multiplanar reformatted images are provided for review. Automated exposure control, iterative reconstruction, and/or weight-based adjustment of the mA/kV was utilized to reduce the radiation dose to as low as reasonably achievable. COMPARISON: 01/28/2023 CLINICAL HISTORY: Abdominal pain, acute, nonlocalized. Patient states abdominal pain and vomiting since this morning; denies known sick contacts. FINDINGS: LOWER CHEST: No acute abnormality. LIVER: The liver is unremarkable.  GALLBLADDER AND BILE DUCTS: Prior cholecystectomy. There is marked dilation of the common bile duct measuring 3.1 cm, with Choledocholithiasis noted with largest calculus measuring 12 mm. Mild intrahepatic biliary ductal dilation. SPLEEN: No acute abnormality. PANCREAS: No acute abnormality. ADRENAL GLANDS: No acute abnormality. KIDNEYS, URETERS AND BLADDER: No stones in the kidneys or ureters. No hydronephrosis. No perinephric or periureteral stranding. Assessment of the bladder is limited by streak artifact from bilateral hip arthroplasties. GI AND BOWEL: Stomach demonstrates no acute abnormality. Chronic diverticulosis with no evidence of diverticulitis. No bowel wall thickening or inflammatory change. There is no bowel obstruction. PERITONEUM AND RETROPERITONEUM: No ascites. No free air. VASCULATURE: Atherosclerosis of the aorta and coronary vasculature. Aorta is normal in caliber. LYMPH NODES: No lymphadenopathy. REPRODUCTIVE ORGANS: No acute abnormality. BONES AND SOFT TISSUES: Unremarkable bilateral hip arthroplasties. No acute osseous abnormality. No focal soft tissue abnormality. IMPRESSION: 1. Marked common bile duct dilation (3.1 cm) with choledocholithiasis (largest 12 mm) and mild intrahepatic biliary ductal dilation. 2. Colonic diverticulosis without diverticulitis. Electronically signed by: Ozell Daring MD 06/24/2024 10:55 PM EDT RP Workstation: [id number]      Impression / Plan:   Assessment: Principal Problem:   Acute hepatitis   [name] [name] is a 78 y.o. y/o female with chronically abnormal liver enzymes dating back to 2024.  The patient denies any use of Advil Aleve Motrin BC Goody powders or any herbals or supplements.  She also denies starting any new medications.  The patient's hepatitis panel back in May 2024 was negative and repeat testing of that has been sent off.  Plan:  The patient will be set up for an ERCP for today.  The patient has been explained the risk benefits  and alternatives of the procedure including anesthesia risks bleeding perforation pancreatitis and death.  The patient reports that she cannot go on like this and would like to get the procedure done.  The patient reported that she wanted to speak to her daughter but multiple attempts by the patient and by mysel only came up with a busy signal and we could not contact the patient's daughter.  I will send off other labs to look for possible other causes of abnormal liver enzymes such as Epstein-Barr virus CMV, ANA, AMA and SMA.  The patient has been explained the plan and agrees with it.  Thank you for involving me in the care of this patient.      LOS: 0 days   [name] Copping, MD, MD. NOLIA 06/25/2024, 8:36 AM,  Pager [phone] 7am-5pm  Check AMION for 5pm -7am coverage and on weekends   Note: This dictation was prepared with Dragon dictation along with smaller phrase technology. Any transcriptional errors that result from this process are unintentional.

## 2024-06-25 NOTE — Transfer of Care (Signed)
 Immediate Anesthesia Transfer of Care Note  Patient: [name] [name]  Procedure(s) Performed: ERCP, WITH INTERVENTION IF INDICATED INSERTION, STENT, BILE DUCT  Patient Location: PACU  Anesthesia Type:MAC  Level of Consciousness: awake, alert , and oriented  Airway & Oxygen Therapy: Patient Spontanous Breathing  Post-op Assessment: Report given to RN and Post -op Vital signs reviewed and stable  Post vital signs: stable  Last Vitals:  Vitals Value Taken Time  BP 111/64 06/25/24 14:44  Temp 37 C 06/25/24 14:44  Pulse 88 06/25/24 14:44  Resp 20 06/25/24 14:44  SpO2 96 % 06/25/24 14:44  Vitals shown include unfiled device data.  Last Pain:  Vitals:   06/25/24 1444  TempSrc: Temporal  PainSc: 0-No pain         Complications: No notable events documented.

## 2024-06-25 NOTE — Plan of Care (Signed)
  Problem: Education: Goal: Ability to describe self-care measures that may prevent or decrease complications (Diabetes Survival Skills Education) will improve Outcome: Progressing Goal: Individualized Educational Video(s) Outcome: Progressing   Problem: Coping: Goal: Ability to adjust to condition or change in health will improve Outcome: Progressing   Problem: Fluid Volume: Goal: Ability to maintain a balanced intake and output will improve Outcome: Progressing   Problem: Health Behavior/Discharge Planning: Goal: Ability to identify and utilize available resources and services will improve Outcome: Progressing Goal: Ability to manage health-related needs will improve Outcome: Progressing   Problem: Metabolic: Goal: Ability to maintain appropriate glucose levels will improve Outcome: Progressing   Problem: Skin Integrity: Goal: Risk for impaired skin integrity will decrease Outcome: Progressing   Problem: Nutritional: Goal: Maintenance of adequate nutrition will improve Outcome: Progressing Goal: Progress toward achieving an optimal weight will improve Outcome: Progressing   Problem: Tissue Perfusion: Goal: Adequacy of tissue perfusion will improve Outcome: Progressing   Problem: Education: Goal: Knowledge of General Education information will improve Description: Including pain rating scale, medication(s)/side effects and non-pharmacologic comfort measures Outcome: Progressing   Problem: Health Behavior/Discharge Planning: Goal: Ability to manage health-related needs will improve Outcome: Progressing   Problem: Clinical Measurements: Goal: Ability to maintain clinical measurements within normal limits will improve Outcome: Progressing Goal: Will remain free from infection Outcome: Progressing Goal: Diagnostic test results will improve Outcome: Progressing Goal: Respiratory complications will improve Outcome: Progressing Goal: Cardiovascular complication will  be avoided Outcome: Progressing   Problem: Activity: Goal: Risk for activity intolerance will decrease Outcome: Progressing   Problem: Elimination: Goal: Will not experience complications related to bowel motility Outcome: Progressing Goal: Will not experience complications related to urinary retention Outcome: Progressing

## 2024-06-26 ENCOUNTER — Encounter: Payer: Self-pay | Admitting: Internal Medicine

## 2024-06-26 DIAGNOSIS — B179 Acute viral hepatitis, unspecified: Secondary | ICD-10-CM | POA: Diagnosis not present

## 2024-06-26 LAB — EPSTEIN-BARR VIRUS (EBV) ANTIBODY PROFILE
EBV NA IgG: 19.5 U/mL — ABNORMAL HIGH (ref 0.0–17.9)
EBV VCA IgG: >600 U/mL — ABNORMAL HIGH (ref 0.0–17.9)
EBV VCA IgM: <36 U/mL (ref 0.0–35.9)

## 2024-06-26 LAB — CBC
HCT: 32.8 % — ABNORMAL LOW (ref 36.0–46.0)
Hemoglobin: 10.8 g/dL — ABNORMAL LOW (ref 12.0–15.0)
MCH: 30 pg (ref 26.0–34.0)
MCHC: 32.9 g/dL (ref 30.0–36.0)
MCV: 91.1 fL (ref 80.0–100.0)
Platelets: 167 K/uL (ref 150–400)
RBC: 3.6 MIL/uL — ABNORMAL LOW (ref 3.87–5.11)
RDW: 14.2 % (ref 11.5–15.5)
WBC: 6.2 K/uL (ref 4.0–10.5)
nRBC: 0 % (ref 0.0–0.2)

## 2024-06-26 LAB — ANA W/REFLEX: Anti Nuclear Antibody (ANA): NEGATIVE

## 2024-06-26 LAB — COMPREHENSIVE METABOLIC PANEL WITH GFR
ALT: 356 U/L — ABNORMAL HIGH (ref 0–44)
AST: 230 U/L — ABNORMAL HIGH (ref 15–41)
Albumin: 2.9 g/dL — ABNORMAL LOW (ref 3.5–5.0)
Alkaline Phosphatase: 246 U/L — ABNORMAL HIGH (ref 38–126)
Anion gap: 6 (ref 5–15)
BUN: 26 mg/dL — ABNORMAL HIGH (ref 8–23)
CO2: 23 mmol/L (ref 22–32)
Calcium: 8.7 mg/dL — ABNORMAL LOW (ref 8.9–10.3)
Chloride: 107 mmol/L (ref 98–111)
Creatinine, Ser: 1.25 mg/dL — ABNORMAL HIGH (ref 0.44–1.00)
GFR, Estimated: 44 mL/min — ABNORMAL LOW (ref >60)
Glucose, Bld: 126 mg/dL — ABNORMAL HIGH (ref 70–99)
Potassium: 3.9 mmol/L (ref 3.5–5.1)
Sodium: 136 mmol/L (ref 135–145)
Total Bilirubin: 3.8 mg/dL — ABNORMAL HIGH (ref 0.0–1.2)
Total Protein: 7.1 g/dL (ref 6.5–8.1)

## 2024-06-26 LAB — GLUCOSE, CAPILLARY
Glucose-Capillary: 131 mg/dL — ABNORMAL HIGH (ref 70–99)
Glucose-Capillary: 146 mg/dL — ABNORMAL HIGH (ref 70–99)

## 2024-06-26 NOTE — Plan of Care (Signed)

## 2024-06-26 NOTE — Discharge Summary (Signed)
 Physician Discharge Summary   Patient: [name] [name] MRN: [id number]  DOB: November 07, 1945   Admit:     Date of Admission: 06/24/2024 Admitted from: home   Discharge: Date of discharge: 06/26/24 Disposition: Home Condition at discharge: good  CODE STATUS: FULL CODE     Discharge Physician: Laneta Blunt, DO [facility]     PCP: Sampson Ethridge LABOR, MD  Recommendations for Outpatient Follow-up:  Follow up with PCP Entzminger, Ethridge LABOR, MD in 1-2 weeks Follow up w/ GI in 2 weeks for ERCP stent retrieval   Discharge Instructions     Diet - low sodium heart healthy   Complete by: As directed    Increase activity slowly   Complete by: As directed          Discharge Diagnoses: Principal Problem:   Acute hepatitis Active Problems:   Choledocholithiasis       Hospital course / significant events:   HPI:  [name] [name] is a 78 y.o. female with medical history significant of hypertension, hyperlipidemia, diabetes, previous stroke with no residual weakness who was otherwise well until this morning when she started experiencing nausea and vomiting with associated abdominal pain.  According to patient she vomited about 5 times with abdominal pain intensity of 10/10 not localized.  Pain relieved on its own.  At the time patient was being seen she denied cough chest pain urinary complaint or diarrhea.  She does not abuse alcohol and the last alcohol use according to her was 10 years ago.   10/02: to ED. Significant for elevated liver enzymes. CT (+)marked common bile duct dilation with choledocholithiasis --> ERCP being planned w/ GI 10/03: ERCP today - (+)choledocholithiasis, removal partially accomplishes, stent placed into CBD, return for repeat ERCP 2 weeks  10/04: tolerating reg diet. LFT and WBC normalizing. Per GI ok to go if tolerating diet and no pain - ok for dc    Consultants:  Gastroenterology  Procedures/Surgeries: 06/25/24  ERCP      ASSESSMENT & PLAN:   Acute transaminitis in the setting of choledocholithiasis marked common bile duct dilation (3.1 cm) with choledocholithiasis and mild intrahepatic biliary ductal dilation. AST 1609, ALT 851, alkaline phosphatase 367 lipase 65 ERCP 10/03 --> (+)choledocholithiasis, removal partially accomplished, stent placed into CBD, return for repeat ERCP 2 weeks  Pain control Advancing diet and tolerating well    AKI - improving  Creatinine around 1.1 a year ago however presents with creatinine of 1.4 supplemental IV fluid Monitor renal function closely   Essential hypertension hold lisinopril  in the setting of AKI   Hyperlipidemia Holding statin therapy in the setting of transaminitis   Diabetes mellitus type 2 with hyperglycemia A1C 7.2, at goal for age  Presented with glucose 216 On non-insulin  therapy w/ glipizide , jardiance , metformin, ozempic at home Also on Tresiba 40 units daily Initiated on sliding scale   History of stroke with no residual weakness Holding statin therapy in the setting of transaminitis   Short term memory deficit  Monitor outpatient Pt does not remember conversations from same day, does not remember day following ERCP what procedure was done or why. She is oriented to self, place, loosely oriented to time but not oriented to situation. Dicsussed /w daughter     Class 1 obesity based on BMI: Body mass index is 30.93 kg/m.SABRA Significantly low or high BMI is associated with higher medical risk.  Underweight - under 18  overweight - 25 to 29 obese - 30 or  more Class 1 obesity: BMI of 30.0 to 34 Class 2 obesity: BMI of 35.0 to 39 Class 3 obesity: BMI of 40.0 to 49 Super Morbid Obesity: BMI 50-59 Super-super Morbid Obesity: BMI 60+ Healthy nutrition and physical activity advised as adjunct to other disease management and risk reduction treatments             Discharge Instructions  Allergies as of 06/26/2024        Reactions   Cortisone    Elevates blood sugar   Gabapentin     Kidney doctor told me not to take   Nsaids    Kidney doctor told me not to take        Medication List     PAUSE taking these medications    atorvastatin  20 MG tablet Wait to take this until your doctor or other care provider tells you to start again. Commonly known as: LIPITOR Take 20 mg by mouth daily.   lisinopril  20 MG tablet Wait to take this until your doctor or other care provider tells you to start again. Commonly known as: ZESTRIL  Take 1 tablet (20 mg total) by mouth daily.       STOP taking these medications    acetaminophen  325 MG tablet Commonly known as: TYLENOL        TAKE these medications    acarbose  100 MG tablet Commonly known as: PRECOSE  Take 100 mg by mouth 3 (three) times daily with meals.   FreeStyle Libre 2 Sensor Misc Apply topically.   glipiZIDE  10 MG tablet Commonly known as: GLUCOTROL  Take 10 mg by mouth 2 (two) times daily before a meal.   GNP Calcium  1200 1200-1000 MG-UNIT Chew Chew 1,200 mg by mouth daily with breakfast. Take in combination with vitamin D  and magnesium .   Jardiance  25 MG Tabs tablet Generic drug: empagliflozin  Take 25 mg by mouth every morning.   metFORMIN 500 MG 24 hr tablet Commonly known as: GLUCOPHAGE-XR Take 500 mg by mouth daily.   nystatin  powder Commonly known as: MYCOSTATIN /NYSTOP  Apply 1 Application topically 2 (two) times daily.   Ozempic (0.25 or 0.5 MG/DOSE) 2 MG/3ML Sopn Generic drug: Semaglutide(0.25 or 0.5MG /DOS) Inject 0.25 mg into the skin once a week.   Tresiba FlexTouch 200 UNIT/ML FlexTouch Pen Generic drug: insulin  degludec Inject 40 Units into the skin daily.   triamcinolone  cream 0.1 % Commonly known as: KENALOG  Apply 1 Application topically 3 (three) times daily.         Follow-up Information     Vanga, Corinn Skiff, MD. Schedule an appointment as soon as possible for a visit.   Specialty:  Gastroenterology Why: needs repeat ERCP in 2 weeks to retrieve stent Contact information: [address] [place] [address] [phone]                 Allergies  Allergen Reactions   Cortisone     Elevates blood sugar   Gabapentin      Kidney doctor told me not to take   Nsaids     Kidney doctor told me not to take     Subjective / Brief ROS:  Patient reports feeling okay now, abd pain is improved and she is eating breakfast, excited about regular diet  Denies CP/SOB.  Pain controlled.     Family Communication: called daughter Tillman 06/26/24 1:40 PM confirmed pt lives at home w/ he other daughter, is ok to go back there     Discharge Exam: BP 113/70 (BP Location: Right Arm)  Pulse 67   Temp 97.7 F (36.5 C) (Oral)   Resp 16   Ht 5' 2 (1.575 m)   Wt 76.7 kg   SpO2 99%   BMI 30.93 kg/m  General: Pt is alert, awake, not in acute distress Cardiovascular: RRR, S1/S2 +, no rubs, no gallops Respiratory: CTA bilaterally, no wheezing, no rhonchi Abdominal: Soft, NT, ND, bowel sounds + Extremities: no edema, no cyanosis     The results of significant diagnostics from this hospitalization (including imaging, microbiology, ancillary and laboratory) are listed below for reference.     Microbiology: No results found for this or any previous visit (from the past 240 hours).   Labs: BNP (last 3 results) No results for input(s): BNP in the last 8760 hours. Basic Metabolic Panel: Recent Labs  Lab 06/24/24 0147 06/24/24 1737 06/25/24 0531 06/26/24 0604  NA  --  138 140 136  K  --  4.0 3.7 3.9  CL  --  104 109 107  CO2  --  22 24 23   GLUCOSE  --  216* 101* 126*  BUN  --  29* 29* 26*  CREATININE 1.38* 1.41* 1.36* 1.25*  CALCIUM   --  9.7 8.7* 8.7*   Liver Function Tests: Recent Labs  Lab 06/24/24 1737 06/25/24 0905 06/26/24 0604  AST 1,609* 512* 230*  ALT 851* 522* 356*  ALKPHOS 367* 276* 246*  BILITOT 3.0* 4.3* 3.8*  PROT 9.4* 7.1 7.1   ALBUMIN 4.0 3.0* 2.9*   Recent Labs  Lab 06/24/24 1737  LIPASE 65*   No results for input(s): AMMONIA in the last 168 hours. CBC: Recent Labs  Lab 06/24/24 0147 06/24/24 1737 06/25/24 0531 06/26/24 0604  WBC 13.9* 12.7* 11.9* 6.2  HGB 11.8* 13.2 11.4* 10.8*  HCT 35.5* 40.3 34.2* 32.8*  MCV 89.4 90.2 90.0 91.1  PLT 210 218 197 167   Cardiac Enzymes: No results for input(s): CKTOTAL, CKMB, CKMBINDEX, TROPONINI in the last 168 hours. BNP: Invalid input(s): POCBNP CBG: Recent Labs  Lab 06/25/24 1318 06/25/24 1700 06/25/24 2217 06/26/24 0730 06/26/24 1201  GLUCAP 83 106* 167* 131* 146*   D-Dimer No results for input(s): DDIMER in the last 72 hours. Hgb A1c Recent Labs    06/25/24 0149 06/25/24 0531  HGBA1C 7.2* 7.5*   Lipid Profile No results for input(s): CHOL, HDL, LDLCALC, TRIG, CHOLHDL, LDLDIRECT in the last 72 hours. Thyroid function studies No results for input(s): TSH, T4TOTAL, T3FREE, THYROIDAB in the last 72 hours.  Invalid input(s): FREET3 Anemia work up No results for input(s): VITAMINB12, FOLATE, FERRITIN, TIBC, IRON, RETICCTPCT in the last 72 hours. Urinalysis    Component Value Date/Time   COLORURINE AMBER (A) 06/24/2024 1957   APPEARANCEUR CLEAR (A) 06/24/2024 1957   APPEARANCEUR Hazy 03/01/2013 0932   LABSPEC 1.024 06/24/2024 1957   LABSPEC 1.019 03/01/2013 0932   PHURINE 5.0 06/24/2024 1957   GLUCOSEU >=500 (A) 06/24/2024 1957   GLUCOSEU 50 mg/dL 93/90/7985 9067   HGBUR NEGATIVE 06/24/2024 1957   BILIRUBINUR NEGATIVE 06/24/2024 1957   BILIRUBINUR Negative 03/01/2013 0932   KETONESUR NEGATIVE 06/24/2024 1957   PROTEINUR NEGATIVE 06/24/2024 1957   NITRITE NEGATIVE 06/24/2024 1957   LEUKOCYTESUR NEGATIVE 06/24/2024 1957   LEUKOCYTESUR Negative 03/01/2013 0932   Sepsis Labs Recent Labs  Lab 06/24/24 0147 06/24/24 1737 06/25/24 0531 06/26/24 0604  WBC 13.9* 12.7* 11.9* 6.2    Microbiology No results found for this or any previous visit (from the past 240 hours). Imaging DG C-Arm 1-60 Min-No  Report Result Date: 06/25/2024 Fluoroscopy was utilized by the requesting physician.  No radiographic interpretation.   CT ABDOMEN PELVIS WO CONTRAST Result Date: 06/24/2024 EXAM: CT ABDOMEN AND PELVIS WITHOUT CONTRAST 06/24/2024 10:50:44 PM TECHNIQUE: CT of the abdomen and pelvis was performed without the administration of intravenous contrast. Multiplanar reformatted images are provided for review. Automated exposure control, iterative reconstruction, and/or weight-based adjustment of the mA/kV was utilized to reduce the radiation dose to as low as reasonably achievable. COMPARISON: 01/28/2023 CLINICAL HISTORY: Abdominal pain, acute, nonlocalized. Patient states abdominal pain and vomiting since this morning; denies known sick contacts. FINDINGS: LOWER CHEST: No acute abnormality. LIVER: The liver is unremarkable. GALLBLADDER AND BILE DUCTS: Prior cholecystectomy. There is marked dilation of the common bile duct measuring 3.1 cm, with Choledocholithiasis noted with largest calculus measuring 12 mm. Mild intrahepatic biliary ductal dilation. SPLEEN: No acute abnormality. PANCREAS: No acute abnormality. ADRENAL GLANDS: No acute abnormality. KIDNEYS, URETERS AND BLADDER: No stones in the kidneys or ureters. No hydronephrosis. No perinephric or periureteral stranding. Assessment of the bladder is limited by streak artifact from bilateral hip arthroplasties. GI AND BOWEL: Stomach demonstrates no acute abnormality. Chronic diverticulosis with no evidence of diverticulitis. No bowel wall thickening or inflammatory change. There is no bowel obstruction. PERITONEUM AND RETROPERITONEUM: No ascites. No free air. VASCULATURE: Atherosclerosis of the aorta and coronary vasculature. Aorta is normal in caliber. LYMPH NODES: No lymphadenopathy. REPRODUCTIVE ORGANS: No acute abnormality. BONES AND SOFT  TISSUES: Unremarkable bilateral hip arthroplasties. No acute osseous abnormality. No focal soft tissue abnormality. IMPRESSION: 1. Marked common bile duct dilation (3.1 cm) with choledocholithiasis (largest 12 mm) and mild intrahepatic biliary ductal dilation. 2. Colonic diverticulosis without diverticulitis. Electronically signed by: Ozell Daring MD 06/24/2024 10:55 PM EDT RP Workstation: [id number]      Time coordinating discharge: over 30 minutes  SIGNED:  Calil Amor DO [facility]

## 2024-06-26 NOTE — Plan of Care (Signed)

## 2024-06-27 LAB — ALPHA-1-ANTITRYPSIN: A-1 Antitrypsin, Ser: 151 mg/dL (ref 101–187)

## 2024-06-28 ENCOUNTER — Other Ambulatory Visit: Payer: Self-pay

## 2024-06-28 ENCOUNTER — Encounter: Payer: Self-pay | Admitting: Gastroenterology

## 2024-06-28 DIAGNOSIS — K8050 Calculus of bile duct without cholangitis or cholecystitis without obstruction: Secondary | ICD-10-CM

## 2024-06-28 LAB — ANTI-SMOOTH MUSCLE ANTIBODY, IGG: F-Actin IgG: 8 U (ref 0–19)

## 2024-06-28 LAB — MITOCHONDRIAL ANTIBODIES: Mitochondrial M2 Ab, IgG: <20 U (ref 0.0–20.0)

## 2024-06-28 LAB — CMV DNA BY PCR, QUALITATIVE: CMV DNA, Qual PCR: NEGATIVE

## 2024-07-05 ENCOUNTER — Ambulatory Visit: Payer: Self-pay | Admitting: Gastroenterology

## 2024-07-09 ENCOUNTER — Telehealth: Payer: Self-pay

## 2024-07-09 NOTE — Telephone Encounter (Signed)
 Can be scheduled with Robin for elevated liver enzymes, New pt to est care

## 2024-07-09 NOTE — Telephone Encounter (Signed)
 The patient called to schedule an appointment to establish care and to arrange a follow-up after her recent procedure with Dr. Jinny. She would like to speak with you, Dr. Felicitas nurse, Andrea.

## 2024-08-05 ENCOUNTER — Other Ambulatory Visit: Payer: Self-pay | Admitting: Internal Medicine

## 2024-08-05 ENCOUNTER — Ambulatory Visit
Admission: RE | Admit: 2024-08-05 | Discharge: 2024-08-05 | Disposition: A | Source: Ambulatory Visit | Attending: Internal Medicine | Admitting: Internal Medicine

## 2024-08-05 ENCOUNTER — Ambulatory Visit
Admission: RE | Admit: 2024-08-05 | Discharge: 2024-08-05 | Disposition: A | Discharge status: Home / Self Care | Source: Ambulatory Visit | Attending: Internal Medicine | Admitting: Internal Medicine

## 2024-08-05 DIAGNOSIS — R058 Other specified cough: Secondary | ICD-10-CM

## 2024-08-12 ENCOUNTER — Other Ambulatory Visit: Payer: Self-pay

## 2024-08-12 DIAGNOSIS — E6609 Other obesity due to excess calories: Secondary | ICD-10-CM | POA: Insufficient documentation

## 2024-08-12 DIAGNOSIS — H353290 Exudative age-related macular degeneration, unspecified eye, stage unspecified: Secondary | ICD-10-CM | POA: Insufficient documentation

## 2024-08-12 DIAGNOSIS — J479 Bronchiectasis, uncomplicated: Secondary | ICD-10-CM | POA: Insufficient documentation

## 2024-08-12 DIAGNOSIS — K219 Gastro-esophageal reflux disease without esophagitis: Secondary | ICD-10-CM | POA: Insufficient documentation

## 2024-08-12 DIAGNOSIS — I43 Cardiomyopathy in diseases classified elsewhere: Secondary | ICD-10-CM | POA: Insufficient documentation

## 2024-08-12 DIAGNOSIS — Z6831 Body mass index (BMI) 31.0-31.9, adult: Secondary | ICD-10-CM | POA: Insufficient documentation

## 2024-08-12 DIAGNOSIS — E782 Mixed hyperlipidemia: Secondary | ICD-10-CM | POA: Insufficient documentation

## 2024-08-12 DIAGNOSIS — E66811 Obesity, class 1: Secondary | ICD-10-CM | POA: Insufficient documentation

## 2024-08-12 DIAGNOSIS — I119 Hypertensive heart disease without heart failure: Secondary | ICD-10-CM | POA: Insufficient documentation

## 2024-08-15 NOTE — Progress Notes (Unsigned)
 Gallbladder   08/16/2024 [name] [name] [id number] 02-13-1946  Gastroenterology Office Note    Referring Provider: Sampson Ethridge DELENA DEWAINE Primary Care Physician:  Sampson Ethridge DELENA, MD  Primary GI Provider: Jinny Carmine, MD    Chief Complaint   Chief Complaint  Patient presents with   New Patient (Initial Visit)    ERCP -no symptoms today-she feels much better from the ERCP     History of Present Illness   [name] [name] is a 78 y.o. female with PMHX of hepatitis, presenting today at the request of Entzminger, Ethridge A,*for follow-up after hospitalization due to acute hepatitis and choledocholithiasis.   Patient was hospitalized on 06/24/2024 - 06/26/2024 after presenting with nausea vomiting and abdominal pain.  Patient also with chronic elevation in liver enzymes dating back to 2024 with no GI follow-up. ERCP performed on 06/25/2024 with stent placement. Plan for stent removal in 2 months.  Today, patient reports she is no longer having abdominal pain, nausea, or vomiting.  She states that about 3 months prior to most recent hospitalization that she would have episodes of vomiting and feels so sick.  She would have a bowel movement and then she would start vomiting and have chills.  She is now having a bowel movement every day that is soft and formed.  Denies melena or hematochezia. She is scheduled for ERCP with Dr. Jinny for stent removal on 08/23/2024.   06/24/2024 CT ABD Pelvis IMPRESSION: 1. Marked common bile duct dilation (3.1 cm) with choledocholithiasis (largest 12 mm) and mild intrahepatic biliary ductal dilation. 2. Colonic diverticulosis without diverticulitis.  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) 08/12/2024   Gastroesophageal reflux disease 08/12/2024   Hardening of the aorta (main artery of the heart) 10/08/2022   Hypertension    Hypertensive heart disease without heart failure 08/12/2024   Long term current use of insulin  (HCC) 11/08/2022   Mixed hyperlipidemia 08/12/2024   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) 10/08/2022   Obesity, class 1 08/12/2024   Other obesity due to excess calories 08/12/2024   Peripheral vascular disorder due to diabetes mellitus (HCC) 02/11/2023   Primary osteoporosis 07/23/2023   Secondary hyperparathyroidism of renal origin 02/10/2023   Stage 3b chronic kidney disease (HCC) 10/08/2022   Stroke (HCC)    light stoke No residual deficits    Past Surgical History:  Procedure Laterality Date   BILIARY STENT PLACEMENT  06/25/2024   Procedure: INSERTION, STENT, BILE DUCT;  Surgeon: Jinny Carmine, MD;  Location: [facility];  Service: Endoscopy;;   CHOLECYSTECTOMY     ERCP N/A 06/25/2024   Procedure: ERCP, WITH INTERVENTION IF INDICATED;  Surgeon: Jinny Carmine, MD;  Location: [facility] ENDOSCOPY;  Service: Endoscopy;  Laterality: N/A;   HIP SURGERY     JOINT REPLACEMENT     left hip   STONE EXTRACTION WITH BASKET  06/25/2024   Procedure: ERCP, WITH LITHROTRIPSY OR REMOVAL OF COMMON BILE DUCT CALCULUS USING BASKET;  Surgeon: Jinny Carmine, MD;  Location: [facility] ENDOSCOPY;  Service: Endoscopy;;   TOTAL HIP ARTHROPLASTY Right 05/05/2017   Procedure: TOTAL HIP ARTHROPLASTY ANTERIOR APPROACH;  Surgeon: Leora Lynwood SAUNDERS, MD;  Location:  [facility] ORS;  Service: Orthopedics;  Laterality: Right;    Current Outpatient Medications  Medication Sig Dispense Refill   acarbose  (PRECOSE ) 100 MG tablet Take 100 mg by mouth 3 (three) times daily with meals.      Continuous Glucose Sensor (FREESTYLE LIBRE 2 SENSOR) MISC Apply topically.     ergocalciferol   (VITAMIN D2) 1.25 MG (50000 UT) capsule 1 capsule.     furosemide (LASIX) 20 MG tablet Take 20 mg by mouth.     insulin  glargine (LANTUS ) 100 UNIT/ML injection Inject 100 Units into the skin daily.     JARDIANCE  25 MG TABS tablet Take 25 mg by mouth every morning.     No current facility-administered medications for this visit.    Allergies as of 08/16/2024 - Review Complete 08/16/2024  Allergen Reaction Noted   Cortisone  07/09/2018   Gabapentin   05/05/2017   Nsaids  05/05/2017    Family History  Problem Relation Age of Onset   Heart disease Mother    Alcohol abuse Father    Breast cancer Neg Hx     Social History   Socioeconomic History   Marital status: Widowed    Spouse name: Not on file   Number of children: Not on file   Years of education: Not on file   Highest education level: Not on file  Occupational History   Not on file  Tobacco Use   Smoking status: Former    Current packs/day: 0.00    Types: Cigarettes    Quit date: 04/28/2012    Years since quitting: 12.3   Smokeless tobacco: Never  Vaping Use   Vaping status: Never Used  Substance and Sexual Activity   Alcohol use: No   Drug use: No   Sexual activity: Not on file  Other Topics Concern   Not on file  Social History Narrative   Not on file   Social Drivers of Health   Financial Resource Strain: Not on file  Food Insecurity: No Food Insecurity (06/25/2024)   Hunger Vital Sign    Worried About Running Out of Food in the Last Year: Never true    Ran Out of Food in the Last Year: Never true  Transportation Needs: No Transportation Needs (06/25/2024)   PRAPARE - Administrator, Civil Service (Medical): No    Lack of Transportation (Non-Medical): No  Physical Activity: Not on file  Stress: Not on file  Social Connections: Not on file  Intimate Partner Violence: Not At Risk (06/25/2024)   Humiliation, Afraid, Rape, and Kick questionnaire    Fear of Current or Ex-Partner: No     Emotionally Abused: No    Physically Abused: No    Sexually Abused: No     RELEVANT GI HISTORY, IMAGING AND LABS: CBC    Component Value Date/Time   WBC 6.2 06/26/2024 0604   RBC 3.60 (L) 06/26/2024 0604   HGB 10.8 (L) 06/26/2024 0604   HGB 10.3 (L) 12/27/2013 0357   HCT 32.8 (L) 06/26/2024 0604   HCT 30.8 (L) 12/27/2013 0357   PLT 167 06/26/2024 0604   PLT 188 12/27/2013 0357   MCV 91.1 06/26/2024 0604   MCV 88 12/27/2013 0357   MCH 30.0 06/26/2024 0604   MCHC 32.9 06/26/2024 0604   RDW 14.2 06/26/2024 0604   RDW 15.2 (H) 12/27/2013 0357   LYMPHSABS 1.6 11/08/2016 1155   LYMPHSABS 0.9 (L) 12/27/2013 0357   MONOABS 0.5 11/08/2016 1155   MONOABS 0.8 12/27/2013 0357  EOSABS 0.1 11/08/2016 1155   EOSABS 0.0 12/27/2013 0357   BASOSABS 0.0 11/08/2016 1155   BASOSABS 0.0 12/27/2013 0357   Recent Labs    06/24/24 0147 06/24/24 1737 06/25/24 0531 06/26/24 0604  HGB 11.8* 13.2 11.4* 10.8*    CMP     Component Value Date/Time   NA 136 06/26/2024 0604   NA 137 07/27/2018 0954   NA 136 12/29/2013 0358   K 3.9 06/26/2024 0604   K 3.3 (L) 12/29/2013 0358   CL 107 06/26/2024 0604   CL 105 12/29/2013 0358   CO2 23 06/26/2024 0604   CO2 28 12/29/2013 0358   GLUCOSE 126 (H) 06/26/2024 0604   GLUCOSE 141 (H) 12/29/2013 0358   BUN 26 (H) 06/26/2024 0604   BUN 29 (H) 07/27/2018 0954   BUN 5 (L) 12/29/2013 0358   CREATININE 1.25 (H) 06/26/2024 0604   CREATININE 0.86 12/29/2013 0358   CALCIUM  8.7 (L) 06/26/2024 0604   CALCIUM  8.4 (L) 12/29/2013 0358   PROT 7.1 06/26/2024 0604   PROT 8.1 07/27/2018 0954   PROT 6.9 12/29/2013 0358   ALBUMIN 2.9 (L) 06/26/2024 0604   ALBUMIN 4.1 07/27/2018 0954   ALBUMIN 2.4 (L) 12/29/2013 0358   AST 230 (H) 06/26/2024 0604   AST 47 (H) 12/29/2013 0358   ALT 356 (H) 06/26/2024 0604   ALT 171 (H) 12/29/2013 0358   ALKPHOS 246 (H) 06/26/2024 0604   ALKPHOS 264 (H) 12/29/2013 0358   BILITOT 3.8 (H) 06/26/2024 0604   BILITOT 0.5  07/27/2018 0954   BILITOT 1.3 (H) 12/29/2013 0358   GFRNONAA 44 (L) 06/26/2024 0604   GFRNONAA >60 12/29/2013 0358   GFRAA 47 (L) 07/27/2018 0954   GFRAA >60 12/29/2013 0358      Latest Ref Rng & Units 06/26/2024    6:04 AM 06/25/2024    9:05 AM 06/24/2024    5:37 PM  Hepatic Function  Total Protein 6.5 - 8.1 g/dL 7.1  7.1  9.4   Albumin 3.5 - 5.0 g/dL 2.9  3.0  4.0   AST 15 - 41 U/L 230  512  1,609   ALT 0 - 44 U/L 356  522  851   Alk Phosphatase 38 - 126 U/L 246  276  367   Total Bilirubin 0.0 - 1.2 mg/dL 3.8  4.3  3.0   Bilirubin, Direct 0.0 - 0.2 mg/dL  2.7        Review of Systems   All systems reviewed and negative except where noted in HPI.    Physical Exam  BP 135/81   Pulse 73   Temp 98.2 F (36.8 C)   Ht 5' 4 (1.626 m)   Wt 172 lb 6.4 oz (78.2 kg)   SpO2 98%   BMI 29.59 kg/m  No LMP recorded. Patient is postmenopausal. General:   Alert and oriented. Pleasant and cooperative. Well-nourished and well-developed.  In no acute distress Head:  Normocephalic and atraumatic. Eyes:  Without icterus Ears:  Normal auditory acuity. Lungs:  Respirations even and unlabored.  Clear throughout to auscultation.   No wheezes, crackles, or rhonchi. No acute distress. Heart:  Regular rate and rhythm; no murmurs, clicks, rubs, or gallops. Abdomen:  Normal bowel sounds.  No bruits.  Soft, non-tender and non-distended without masses, hepatosplenomegaly or hernias noted.  No guarding or rebound tenderness.     Rectal:  Deferred. Msk:  Symmetrical without gross deformities. Normal posture. Extremities:  Without edema. Neurologic:  Alert and  oriented  x4;  grossly normal neurologically. Skin:  Intact without significant lesions or rashes. Psych:  Alert and cooperative. Normal mood and affect.   Assessment & Plan   Lalena C Salm is a 78 y.o. female presenting today for follow-up after hospitalization with acute hepatitis and choledocholithiasis . She is s/p ERCP with stent  placement.  Patient reports she is doing well since hospitalization, with no further episodes of abdominal pain or vomiting. I will recheck hepatic function panel today. Patient is scheduled for ERCP with Dr. Jinny on 08/23/2024.    Follow up in 3 months  Grayce Bohr, DNP, AGNP-C [facility]

## 2024-08-16 ENCOUNTER — Ambulatory Visit: Admitting: Family Medicine

## 2024-08-16 ENCOUNTER — Encounter: Payer: Self-pay | Admitting: Family Medicine

## 2024-08-16 VITALS — BP 135/81 | HR 73 | Temp 98.2°F | Ht 64.0 in | Wt 172.4 lb

## 2024-08-16 DIAGNOSIS — R748 Abnormal levels of other serum enzymes: Secondary | ICD-10-CM | POA: Diagnosis not present

## 2024-08-16 DIAGNOSIS — K8050 Calculus of bile duct without cholangitis or cholecystitis without obstruction: Secondary | ICD-10-CM | POA: Diagnosis not present

## 2024-08-17 ENCOUNTER — Ambulatory Visit: Payer: Self-pay | Admitting: Family Medicine

## 2024-08-17 LAB — HEPATIC FUNCTION PANEL
ALT: 12 IU/L (ref 0–32)
AST: 22 IU/L (ref 0–40)
Albumin: 4.2 g/dL (ref 3.8–4.8)
Alkaline Phosphatase: 100 IU/L (ref 49–135)
Bilirubin Total: 0.5 mg/dL (ref 0.0–1.2)
Bilirubin, Direct: 0.24 mg/dL (ref 0.00–0.40)
Total Protein: 8.6 g/dL — ABNORMAL HIGH (ref 6.0–8.5)

## 2024-08-18 ENCOUNTER — Encounter: Payer: Self-pay | Admitting: Gastroenterology

## 2024-08-18 NOTE — Telephone Encounter (Signed)
 Patient notified.  Tried calling-VM not set up-unable to leave message.   Liver enzymes now within normal range s/p ERCP with stent.    Dorothe, please call patient and let her know that her liver enzymes are now within normal range since she had the stent placed.    Thanks   Grayce Bohr, DNP, AGNP-C

## 2024-08-18 NOTE — Telephone Encounter (Signed)
 Tried calling-VM not set up-unable to leave message.  Liver enzymes now within normal range s/p ERCP with stent.    Dorothe, please call patient and let her know that her liver enzymes are now within normal range since she had the stent placed.    Thanks   Grayce Bohr, DNP, AGNP-C

## 2024-08-23 ENCOUNTER — Ambulatory Visit

## 2024-08-23 ENCOUNTER — Ambulatory Visit: Admitting: Anesthesiology

## 2024-08-23 ENCOUNTER — Encounter
Admission: RE | Disposition: A | Discharge status: Home / Self Care | Payer: Self-pay | Source: Home / Self Care | Attending: Gastroenterology

## 2024-08-23 ENCOUNTER — Encounter: Payer: Self-pay | Admitting: Gastroenterology

## 2024-08-23 ENCOUNTER — Ambulatory Visit
Admission: RE | Admit: 2024-08-23 | Discharge: 2024-08-23 | Disposition: A | Discharge status: Home / Self Care | Attending: Gastroenterology | Admitting: Gastroenterology

## 2024-08-23 DIAGNOSIS — Z4659 Encounter for fitting and adjustment of other gastrointestinal appliance and device: Secondary | ICD-10-CM | POA: Diagnosis not present

## 2024-08-23 HISTORY — PX: STENT REMOVAL: SHX6421

## 2024-08-23 HISTORY — PX: ERCP: SHX5425

## 2024-08-23 LAB — GLUCOSE, CAPILLARY: Glucose-Capillary: 174 mg/dL — ABNORMAL HIGH (ref 70–99)

## 2024-08-23 SURGERY — ERCP, WITH INTERVENTION IF INDICATED
Anesthesia: General

## 2024-08-23 MED ORDER — PROPOFOL 10 MG/ML IV BOLUS
INTRAVENOUS | Status: DC | PRN
  Administered 2024-08-23: 50 mg via INTRAVENOUS

## 2024-08-23 MED ORDER — LIDOCAINE HCL (PF) 2 % IJ SOLN
INTRAMUSCULAR | Status: AC
  Filled 2024-08-23: qty 5

## 2024-08-23 MED ORDER — LIDOCAINE HCL (CARDIAC) PF 100 MG/5ML IV SOSY
PREFILLED_SYRINGE | INTRAVENOUS | Status: DC | PRN
  Administered 2024-08-23: 100 mg via INTRAVENOUS

## 2024-08-23 MED ORDER — PROPOFOL 500 MG/50ML IV EMUL
INTRAVENOUS | Status: DC | PRN
  Administered 2024-08-23: 120 ug/kg/min via INTRAVENOUS

## 2024-08-23 MED ORDER — PROPOFOL 1000 MG/100ML IV EMUL
INTRAVENOUS | Status: AC
  Filled 2024-08-23: qty 100

## 2024-08-23 MED ORDER — LACTATED RINGERS IV SOLN: INTRAVENOUS | Status: DC

## 2024-08-23 NOTE — Anesthesia Preprocedure Evaluation (Signed)
 Anesthesia Evaluation  Patient identified by MRN, date of birth, ID band Patient awake    Reviewed: Allergy & Precautions, NPO status , Patient's Chart, lab work & pertinent test results  History of Anesthesia Complications Negative for: history of anesthetic complications  Airway Mallampati: III  TM Distance: <3 FB Neck ROM: full    Dental  (+) Missing   Pulmonary neg shortness of breath, former smoker   Pulmonary exam normal        Cardiovascular Exercise Tolerance: Good hypertension, (-) angina Normal cardiovascular exam     Neuro/Psych  Neuromuscular disease CVA  negative psych ROS   GI/Hepatic Neg liver ROS,GERD  Controlled,,  Endo/Other  negative endocrine ROSdiabetes, Type 2    Renal/GU Renal disease  negative genitourinary   Musculoskeletal   Abdominal   Peds  Hematology negative hematology ROS (+)   Anesthesia Other Findings Patient is NPO appropriate and reports no nausea or vomiting today.  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) 08/12/2024: Gastroesophageal reflux disease 10/08/2022: Hardening of the aorta (main artery of the heart) No date: Hypertension 08/12/2024: Hypertensive heart disease without heart failure 11/08/2022: Long term current use of insulin  (HCC) 08/12/2024: Mixed hyperlipidemia 10/08/2022: 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) 08/12/2024: Obesity, class 1 08/12/2024: Other obesity due to excess calories 02/11/2023: Peripheral vascular disorder due to diabetes mellitus  (HCC) 07/23/2023: Primary osteoporosis 02/10/2023: Secondary hyperparathyroidism of renal  origin 10/08/2022: Stage 3b chronic kidney disease (HCC) No date: Stroke [facility])     Comment:  light stoke No residual deficits  Past Surgical History: 06/25/2024: BILIARY STENT PLACEMENT     Comment:  Procedure: INSERTION, STENT, BILE DUCT;  Surgeon: Jinny Carmine, MD;  Location: [facility] ENDOSCOPY;  Service:               Endoscopy;; No date: CHOLECYSTECTOMY 06/25/2024: ERCP; N/A     Comment:  Procedure: ERCP, WITH INTERVENTION IF INDICATED;                Surgeon: Jinny Carmine, MD;  Location: [facility] ENDOSCOPY;                Service: Endoscopy;  Laterality: N/A; No date: HIP SURGERY No date: JOINT REPLACEMENT     Comment:  left hip 06/25/2024: STONE EXTRACTION WITH BASKET     Comment:  Procedure: ERCP, WITH LITHROTRIPSY OR REMOVAL OF COMMON               BILE DUCT CALCULUS USING BASKET;  Surgeon: Jinny Carmine,               MD;  Location: [facility] ENDOSCOPY;  Service: Endoscopy;; 05/05/2017: TOTAL HIP ARTHROPLASTY; Right     Comment:  Procedure: TOTAL HIP ARTHROPLASTY ANTERIOR APPROACH;                Surgeon: Leora Lynwood SAUNDERS, MD;  Location: [facility] ORS;                Service: Orthopedics;  Laterality: Right;     Reproductive/Obstetrics negative OB ROS                              Anesthesia Physical  Anesthesia Plan  ASA: 3  Anesthesia Plan: General   Post-op Pain Management:    Induction: Intravenous  PONV Risk Score and Plan: Propofol  infusion and TIVA  Airway Management Planned: Natural Airway and Nasal Cannula  Additional Equipment:   Intra-op Plan:   Post-operative Plan:   Informed Consent: I have reviewed the patients History and Physical, chart, labs and discussed the procedure including the risks, benefits and alternatives for the proposed anesthesia with the patient or authorized representative who has indicated his/her understanding and acceptance.     Dental Advisory Given  Plan Discussed with: Anesthesiologist, CRNA and  Surgeon  Anesthesia Plan Comments: (Patient consented for risks of anesthesia including but not limited to:  - adverse reactions to medications - risk of airway placement if required - damage to eyes, teeth, lips or other oral mucosa - nerve damage due to positioning  - sore throat or hoarseness - Damage to heart, brain, nerves, lungs, other parts of body or loss of life  Patient voiced understanding and assent.)        Anesthesia Quick Evaluation

## 2024-08-23 NOTE — Op Note (Signed)
 [facility] Patient Name: [name] [name] Procedure Date: 08/23/2024 9:51 AM MRN: [id number] Account #: [id number] Date of Birth: 1946-06-19 Admit Type: Outpatient Age: 78 Room: [facility] ENDO ROOM 4 Gender: Female Note Status: Finalized Instrument Name: CELINDA [id number] Procedure:             ERCP Indications:           Stent removal Providers:             [name] MD, MD Referring MD:          Maridee Scales Medicines:             Propofol  per Anesthesia Complications:         No immediate complications. Procedure:             Pre-Anesthesia Assessment:                        - Prior to the procedure, a History and Physical was                         performed, and patient medications and allergies were                         reviewed. The patient's tolerance of previous                         anesthesia was also reviewed. The risks and benefits                         of the procedure and the sedation options and risks                         were discussed with the patient. All questions were                         answered, and informed consent was obtained. Prior                         Anticoagulants: The patient has taken no anticoagulant                         or antiplatelet agents. ASA Grade Assessment: II - A                         patient with mild systemic disease. After reviewing                         the risks and benefits, the patient was deemed in                         satisfactory condition to undergo the procedure.                        After obtaining informed consent, the scope was passed                         under direct vision. Throughout the procedure, the  patient's blood pressure, pulse, and oxygen                         saturations were monitored continuously. The                         Duodenoscope was introduced through the mouth, and                         used to inject  contrast into and used to inject                         contrast into the bile duct. The ERCP was accomplished                         without difficulty. The patient tolerated the                         procedure well. Findings:      A scout film of the abdomen was obtained. Two stents ending in the main       bile duct were seen. Two stents were removed from the common bile duct       using a snare. The bile duct was deeply cannulated with the 15 mm       balloon. Contrast was injected. I personally interpreted the bile duct       images. There was brisk flow of contrast through the ducts. Image       quality was excellent. Contrast extended to the entire biliary tree. The       biliary tree was swept with a 15 mm balloon starting at the bifurcation.       Nothing was found. Impression:            - Two stents were removed from the common bile duct.                        - The biliary tree was swept and nothing was found. Recommendation:        - Discharge patient to home.                        - Resume previous diet.                        - Watch for pancreatitis, bleeding, perforation, and                         cholangitis. Procedure Code(s):     --- Professional ---                        [phone], Endoscopic retrograde cholangiopancreatography                         (ERCP); with removal of foreign body(s) or stent(s)                         from biliary/pancreatic duct(s)                        25671, Endoscopic catheterization of the biliary  ductal system, radiological supervision and                         interpretation Diagnosis Code(s):     --- Professional ---                        Z46.59, Encounter for fitting and adjustment of other                         gastrointestinal appliance and device CPT copyright 2022 [employer]. All rights reserved. The codes documented in this report are preliminary and upon coder review may   be revised to meet current compliance requirements. [name] MD, MD 08/23/2024 10:24:02 AM This report has been signed electronically. Number of Addenda: 0 Note Initiated On: 08/23/2024 9:51 AM Estimated Blood Loss:  Estimated blood loss: none.      [facility]

## 2024-08-23 NOTE — Anesthesia Postprocedure Evaluation (Signed)
 Anesthesia Post Note  Patient: [name] [name]  Procedure(s) Performed: ERCP, WITH INTERVENTION IF INDICATED STENT REMOVAL  Patient location during evaluation: Endoscopy Anesthesia Type: General Level of consciousness: awake and alert Pain management: pain level controlled Vital Signs Assessment: post-procedure vital signs reviewed and stable Respiratory status: spontaneous breathing, nonlabored ventilation and respiratory function stable Cardiovascular status: blood pressure returned to baseline and stable Postop Assessment: no apparent nausea or vomiting Anesthetic complications: no   There were no known notable events for this encounter.   Last Vitals:  Vitals:   08/23/24 1037 08/23/24 1047  BP: 113/73 118/72  Pulse: 86 75  Resp: 16 17  Temp:    SpO2: 99% 100%    Last Pain:  Vitals:   08/23/24 1047  TempSrc:   PainSc: 0-No pain                 Fairy POUR Raistlin Gum

## 2024-08-23 NOTE — H&P (Signed)
 [name], MD [facility] [address], [address] [place], [place] [address] Phone:[phone] Fax : [phone]  Primary Care Physician:  Sampson Ethridge LABOR, MD Primary Gastroenterologist:  Dr. Copping  Pre-Procedure History & Physical: HPI:  [name] [name] is a 78 y.o. female is here for an ERCP.   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) 08/12/2024   Gastroesophageal reflux disease 08/12/2024   Hardening of the aorta (main artery of the heart) 10/08/2022   Hypertension    Hypertensive heart disease without heart failure 08/12/2024   Long term current use of insulin  (HCC) 11/08/2022   Mixed hyperlipidemia 08/12/2024   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) 10/08/2022   Obesity, class 1 08/12/2024   Other obesity due to excess calories 08/12/2024   Peripheral vascular disorder due to diabetes mellitus (HCC) 02/11/2023   Primary osteoporosis 07/23/2023   Secondary hyperparathyroidism of renal origin 02/10/2023   Stage 3b chronic kidney disease (HCC) 10/08/2022   Stroke (HCC)    light stoke No residual deficits    Past Surgical History:  Procedure Laterality Date   BILIARY STENT PLACEMENT  06/25/2024   Procedure: INSERTION, STENT, BILE DUCT;  Surgeon: Copping Rogelia, MD;  Location: [facility] ENDOSCOPY;  Service: Endoscopy;;   CHOLECYSTECTOMY     ERCP N/A 06/25/2024   Procedure: ERCP, WITH INTERVENTION IF INDICATED;  Surgeon: Copping Rogelia, MD;  Location: [facility] ENDOSCOPY;  Service: Endoscopy;  Laterality: N/A;   HIP SURGERY     JOINT REPLACEMENT     left hip   STONE EXTRACTION WITH BASKET  06/25/2024   Procedure: ERCP, WITH LITHROTRIPSY OR REMOVAL OF COMMON BILE DUCT  CALCULUS USING BASKET;  Surgeon: Copping Rogelia, MD;  Location: [facility] ENDOSCOPY;  Service: Endoscopy;;   TOTAL HIP ARTHROPLASTY Right 05/05/2017   Procedure: TOTAL HIP ARTHROPLASTY ANTERIOR APPROACH;  Surgeon: Leora Lynwood SAUNDERS, MD;  Location: [facility] ORS;  Service: Orthopedics;  Laterality: Right;    Prior to Admission medications   Medication Sig Start Date End Date Taking? Authorizing Provider  acarbose  (PRECOSE ) 100 MG tablet Take 100 mg by mouth 3 (three) times daily with meals.    Yes [provider]  furosemide (LASIX) 20 MG tablet Take 20 mg by mouth.   Yes [provider]  insulin  glargine (LANTUS ) 100 UNIT/ML injection Inject 100 Units into the skin daily.   Yes [provider]  JARDIANCE  25 MG TABS tablet Take 25 mg by mouth every morning.   Yes [provider]  Continuous Glucose Sensor (FREESTYLE LIBRE 2 SENSOR) MISC Apply topically. 01/08/23   [provider]  ergocalciferol  (VITAMIN D2) 1.25 MG (50000 UT) capsule 1 capsule. 01/28/24   [provider]    Allergies as of 06/28/2024 - Review Complete 06/26/2024  Allergen Reaction Noted   Cortisone  07/09/2018   Gabapentin   05/05/2017   Nsaids  05/05/2017    Family History  Problem Relation Age of Onset   Heart disease Mother    Alcohol abuse Father    Breast cancer Neg Hx     Social History   Socioeconomic History   Marital status: Widowed    Spouse name: Not on file   Number of children: Not on file  Years of education: Not on file   Highest education level: Not on file  Occupational History   Not on file  Tobacco Use   Smoking status: Former    Current packs/day: 0.00    Types: Cigarettes    Quit date: 04/28/2012    Years since quitting: 12.3   Smokeless tobacco: Never  Vaping Use   Vaping status: Never Used  Substance and Sexual Activity   Alcohol use: No   Drug use: No   Sexual activity: Not on file  Other Topics Concern   Not on file  Social History  Narrative   Not on file   Social Drivers of Health   Financial Resource Strain: Not on file  Food Insecurity: No Food Insecurity (06/25/2024)   Hunger Vital Sign    Worried About Running Out of Food in the Last Year: Never true    Ran Out of Food in the Last Year: Never true  Transportation Needs: No Transportation Needs (06/25/2024)   PRAPARE - Administrator, Civil Service (Medical): No    Lack of Transportation (Non-Medical): No  Physical Activity: Not on file  Stress: Not on file  Social Connections: Not on file  Intimate Partner Violence: Not At Risk (06/25/2024)   Humiliation, Afraid, Rape, and Kick questionnaire    Fear of Current or Ex-Partner: No    Emotionally Abused: No    Physically Abused: No    Sexually Abused: No    Review of Systems: See HPI, otherwise negative ROS  Physical Exam: BP 121/81   Pulse 75   Temp (!) 96.6 F (35.9 C) (Temporal)   Resp 16   Wt 77 kg   SpO2 100%   BMI 29.15 kg/m  General:   Alert,  pleasant and cooperative in NAD Head:  Normocephalic and atraumatic. Neck:  Supple; no masses or thyromegaly. Lungs:  Clear throughout to auscultation.    Heart:  Regular rate and rhythm. Abdomen:  Soft, nontender and nondistended. Normal bowel sounds, without guarding, and without rebound.   Neurologic:  Alert and  oriented x4;  grossly normal neurologically.  Impression/Plan: [name] [name] is here for an ERCP to be performed for stent removal  Risks, benefits, limitations, and alternatives regarding  ERCP have been reviewed with the patient.  Questions have been answered.  All parties agreeable.   [name], MD  08/23/2024, 9:39 AM

## 2024-08-23 NOTE — Transfer of Care (Signed)
 Immediate Anesthesia Transfer of Care Note  Patient: [name] [name]  Procedure(s) Performed: ERCP, WITH INTERVENTION IF INDICATED  Patient Location: PACU  Anesthesia Type:General  Level of Consciousness: awake and sedated  Airway & Oxygen Therapy: Patient Spontanous Breathing and Patient connected to nasal cannula oxygen  Post-op Assessment: Report given to RN and Post -op Vital signs reviewed and stable  Post vital signs: Reviewed and stable  Last Vitals:  Vitals Value Taken Time  BP    Temp    Pulse    Resp    SpO2      Last Pain:  Vitals:   08/23/24 0929  TempSrc: Temporal         Complications: There were no known notable events for this encounter.

## 2024-08-24 ENCOUNTER — Telehealth: Payer: Self-pay

## 2024-08-24 NOTE — Telephone Encounter (Signed)
 Pt's daughter lmovm requesting call back regarding pt  I called pt and she reported burning in her chest, denies chest pain, SOB, fever, chills, N/V, or bloating... Pt reports burning has improved since this morning...  Pt advised that it is not unusual to experience some discomfort following the procedure due to possible irritation... ED precautions given to pt, she expressed understanding. Pt was told that I will check on her tomorrow unless there are additional recommendations from Dr Jinny...  Please advise if pt should be evaluated today in ED or only if Sx change or worsen.SABRASABRA

## 2024-08-25 NOTE — Telephone Encounter (Signed)
 Called pt x 2, no answer and VM not set up

## 2024-08-26 NOTE — Telephone Encounter (Signed)
 Called pt again to check on her, no answer and VM not set up

## 2024-08-31 NOTE — Telephone Encounter (Signed)
 I have been unable to reach patient to see how she has been doing following ERCP stent removal... I saw that she was seen via home visit by a nurse and comment from Lakeisha states  08/30/2024 text/html Patient as seen by GI last week 08/16/2024. Liver function obtained and it shows that her liver function has improved. GI stent was removed on 08/23/2024; patient continues to do well.

## 2024-09-03 ENCOUNTER — Ambulatory Visit: Admitting: Family Medicine

## 2024-11-15 ENCOUNTER — Ambulatory Visit: Admitting: Family Medicine
